# Patient Record
Sex: Male | Born: 1965 | Race: White | Hispanic: No | Marital: Single | Smoking: Current some day smoker
Health system: Southern US, Community
[De-identification: ages and names within clinical notes are randomized; demographics above are authoritative.]

## PROBLEM LIST (undated history)

## (undated) ENCOUNTER — Emergency Department: Admission: EM | Discharge status: Absent without leave | Payer: Medicaid Other

## (undated) DIAGNOSIS — L97509 Non-pressure chronic ulcer of other part of unspecified foot with unspecified severity: Secondary | ICD-10-CM

## (undated) DIAGNOSIS — E11621 Type 2 diabetes mellitus with foot ulcer: Secondary | ICD-10-CM

## (undated) DIAGNOSIS — E119 Type 2 diabetes mellitus without complications: Secondary | ICD-10-CM

## (undated) DIAGNOSIS — Z720 Tobacco use: Secondary | ICD-10-CM

---

## 2008-07-15 ENCOUNTER — Emergency Department: Payer: Self-pay | Admitting: Emergency Medicine

## 2009-03-30 ENCOUNTER — Observation Stay: Payer: Self-pay | Admitting: Internal Medicine

## 2009-04-01 ENCOUNTER — Inpatient Hospital Stay: Payer: Self-pay | Admitting: Internal Medicine

## 2011-01-12 DIAGNOSIS — J449 Chronic obstructive pulmonary disease, unspecified: Secondary | ICD-10-CM | POA: Insufficient documentation

## 2012-07-04 DIAGNOSIS — G4733 Obstructive sleep apnea (adult) (pediatric): Secondary | ICD-10-CM | POA: Insufficient documentation

## 2012-07-04 DIAGNOSIS — I10 Essential (primary) hypertension: Secondary | ICD-10-CM | POA: Insufficient documentation

## 2012-07-04 DIAGNOSIS — I493 Ventricular premature depolarization: Secondary | ICD-10-CM | POA: Insufficient documentation

## 2013-08-13 DIAGNOSIS — R350 Frequency of micturition: Secondary | ICD-10-CM | POA: Insufficient documentation

## 2014-01-15 DIAGNOSIS — L639 Alopecia areata, unspecified: Secondary | ICD-10-CM | POA: Insufficient documentation

## 2014-06-26 DIAGNOSIS — R0602 Shortness of breath: Secondary | ICD-10-CM | POA: Insufficient documentation

## 2014-10-07 DIAGNOSIS — R351 Nocturia: Secondary | ICD-10-CM | POA: Insufficient documentation

## 2014-10-07 DIAGNOSIS — N529 Male erectile dysfunction, unspecified: Secondary | ICD-10-CM | POA: Insufficient documentation

## 2014-10-07 DIAGNOSIS — N138 Other obstructive and reflux uropathy: Secondary | ICD-10-CM | POA: Insufficient documentation

## 2014-10-07 DIAGNOSIS — N401 Enlarged prostate with lower urinary tract symptoms: Secondary | ICD-10-CM

## 2015-01-09 DIAGNOSIS — Z872 Personal history of diseases of the skin and subcutaneous tissue: Secondary | ICD-10-CM | POA: Insufficient documentation

## 2015-01-09 DIAGNOSIS — L97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 of other part of left foot with fat layer exposed: Secondary | ICD-10-CM | POA: Insufficient documentation

## 2016-06-02 DIAGNOSIS — J679 Hypersensitivity pneumonitis due to unspecified organic dust: Secondary | ICD-10-CM | POA: Insufficient documentation

## 2016-11-30 DIAGNOSIS — H401190 Primary open-angle glaucoma, unspecified eye, stage unspecified: Secondary | ICD-10-CM | POA: Insufficient documentation

## 2016-12-15 ENCOUNTER — Ambulatory Visit: Payer: Medicaid Other | Attending: Neurology

## 2016-12-15 DIAGNOSIS — F5101 Primary insomnia: Secondary | ICD-10-CM | POA: Diagnosis not present

## 2016-12-15 DIAGNOSIS — I10 Essential (primary) hypertension: Secondary | ICD-10-CM | POA: Insufficient documentation

## 2016-12-15 DIAGNOSIS — R0683 Snoring: Secondary | ICD-10-CM | POA: Insufficient documentation

## 2016-12-15 DIAGNOSIS — G4733 Obstructive sleep apnea (adult) (pediatric): Secondary | ICD-10-CM | POA: Insufficient documentation

## 2017-02-07 ENCOUNTER — Ambulatory Visit: Payer: Medicaid Other | Attending: Neurology

## 2017-02-07 DIAGNOSIS — G4733 Obstructive sleep apnea (adult) (pediatric): Secondary | ICD-10-CM | POA: Diagnosis not present

## 2017-06-14 DIAGNOSIS — M48061 Spinal stenosis, lumbar region without neurogenic claudication: Secondary | ICD-10-CM | POA: Insufficient documentation

## 2017-09-05 DIAGNOSIS — Z961 Presence of intraocular lens: Secondary | ICD-10-CM | POA: Insufficient documentation

## 2017-11-30 DIAGNOSIS — F339 Major depressive disorder, recurrent, unspecified: Secondary | ICD-10-CM | POA: Insufficient documentation

## 2017-11-30 DIAGNOSIS — E119 Type 2 diabetes mellitus without complications: Secondary | ICD-10-CM | POA: Insufficient documentation

## 2017-12-29 ENCOUNTER — Ambulatory Visit
Admission: RE | Admit: 2017-12-29 | Discharge: 2017-12-29 | Disposition: A | Discharge status: Home / Self Care | Payer: Medicaid Other | Source: Ambulatory Visit | Attending: Physician Assistant | Admitting: Physician Assistant

## 2017-12-29 ENCOUNTER — Other Ambulatory Visit: Payer: Self-pay | Admitting: Physician Assistant

## 2017-12-29 ENCOUNTER — Encounter: Payer: Medicaid Other | Attending: Physician Assistant | Admitting: Physician Assistant

## 2017-12-29 DIAGNOSIS — G4730 Sleep apnea, unspecified: Secondary | ICD-10-CM | POA: Insufficient documentation

## 2017-12-29 DIAGNOSIS — Z833 Family history of diabetes mellitus: Secondary | ICD-10-CM | POA: Insufficient documentation

## 2017-12-29 DIAGNOSIS — G9009 Other idiopathic peripheral autonomic neuropathy: Secondary | ICD-10-CM | POA: Insufficient documentation

## 2017-12-29 DIAGNOSIS — J449 Chronic obstructive pulmonary disease, unspecified: Secondary | ICD-10-CM | POA: Insufficient documentation

## 2017-12-29 DIAGNOSIS — S81802A Unspecified open wound, left lower leg, initial encounter: Secondary | ICD-10-CM

## 2017-12-29 DIAGNOSIS — E1140 Type 2 diabetes mellitus with diabetic neuropathy, unspecified: Secondary | ICD-10-CM | POA: Diagnosis not present

## 2017-12-29 DIAGNOSIS — L97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 of other part of left foot with fat layer exposed: Secondary | ICD-10-CM | POA: Insufficient documentation

## 2017-12-29 DIAGNOSIS — E11621 Type 2 diabetes mellitus with foot ulcer: Secondary | ICD-10-CM | POA: Insufficient documentation

## 2017-12-29 DIAGNOSIS — Z8249 Family history of ischemic heart disease and other diseases of the circulatory system: Secondary | ICD-10-CM | POA: Diagnosis not present

## 2017-12-29 DIAGNOSIS — X58XXXA Exposure to other specified factors, initial encounter: Secondary | ICD-10-CM | POA: Insufficient documentation

## 2017-12-29 DIAGNOSIS — Z809 Family history of malignant neoplasm, unspecified: Secondary | ICD-10-CM | POA: Diagnosis not present

## 2017-12-29 DIAGNOSIS — F17200 Nicotine dependence, unspecified, uncomplicated: Secondary | ICD-10-CM | POA: Insufficient documentation

## 2017-12-31 NOTE — Progress Notes (Signed)
ELIAZER, HEMPHILL ([id number]) Visit Report for 12/29/2017 Abuse/Suicide Risk Screen Details Patient Name: [name], [name] Date of Service: 12/29/2017 10:30 AM Medical Record Number: [id number] Patient Account Number: [id number] Date of Birth/Sex: 1965/10/23 (52 y.o. Male) Treating RN: Cornell Barman Primary Care Abrina Petz: SYSTEM, PCP Other Clinician: Referring Victory Dresden: Referral, Self Treating Lorra Freeman/Extender: STONE III, HOYT Weeks in Treatment: 0 Abuse/Suicide Risk Screen Items Answer ABUSE/SUICIDE RISK SCREEN: Has anyone close to you tried to hurt or harm you recentlyo No Do you feel uncomfortable with anyone in your familyo No Has anyone forced you do things that you didnot want to doo No Do you have any thoughts of harming yourselfo No Patient displays signs or symptoms of abuse and/or neglect. No Electronic Signature(s) Signed: 12/29/2017 5:38:54 PM By: Gretta Cool, BSN, RN, CWS, Kim RN, BSN Entered By: Gretta Cool, BSN, RN, CWS, Kim on 12/29/2017 10:51:33 [place]chatton, Carin Hock ([id number]) -------------------------------------------------------------------------------- Activities of Daily Living Details Patient Name: [name] Date of Service: 12/29/2017 10:30 AM Medical Record Number: [id number] Patient Account Number: [id number] Date of Birth/Sex: 11-Sep-1965 (53 y.o. Male) Treating RN: Cornell Barman Primary Care Mack Thurmon: SYSTEM, PCP Other Clinician: Referring Ludell Zacarias: Referral, Self Treating Jaishaun Mcnab/Extender: STONE III, HOYT Weeks in Treatment: 0 Activities of Daily Living Items Answer Activities of Daily Living (Please select one for each item) Drive Automobile Completely Able Take Medications Completely Able Use Telephone Completely Able Care for Appearance Completely Able Use Toilet Completely Able Bath / Shower Completely Able Dress Self Completely Able Feed Self Completely Able Walk Completely Able Get In / Out Bed Completely Able Housework Completely Able Prepare  Meals Completely [place] for Self Completely Able Electronic Signature(s) Signed: 12/29/2017 5:38:54 PM By: Gretta Cool, BSN, RN, CWS, Kim RN, BSN Entered By: Gretta Cool, BSN, RN, CWS, Kim on 12/29/2017 10:51:44 Dehn, Carin Hock ([id number]) -------------------------------------------------------------------------------- Education Assessment Details Patient Name: [name] Date of Service: 12/29/2017 10:30 AM Medical Record Number: [id number] Patient Account Number: [id number] Date of Birth/Sex: 01-Jul-1965 (52 y.o. Male) Treating RN: Cornell Barman Primary Care Eyal Greenhaw: SYSTEM, PCP Other Clinician: Referring Ayson Cherubini: Referral, Self Treating Ezri Landers/Extender: STONE III, HOYT Weeks in Treatment: 0 Primary Learner Assessed: Patient Learning Preferences/Education Level/Primary Language Learning Preference: Explanation, Demonstration Highest Education Level: High School Preferred Language: English Cognitive Barrier Assessment/Beliefs Language Barrier: No Translator Needed: No Memory Deficit: No Emotional Barrier: No Cultural/Religious Beliefs Affecting Medical Care: No Physical Barrier Assessment Impaired Vision: Yes right prosthetic Impaired Hearing: No Decreased Hand dexterity: No Knowledge/Comprehension Assessment Knowledge Level: Medium Comprehension Level: Medium Ability to understand written Medium instructions: Ability to understand verbal Medium instructions: Motivation Assessment Anxiety Level: Calm Cooperation: Cooperative Education Importance: Acknowledges Need Interest in Health Problems: Asks Questions Perception: Coherent Willingness to Engage in Self- Medium Management Activities: Readiness to Engage in Self- Medium Management Activities: Electronic Signature(s) Signed: 12/29/2017 5:38:54 PM By: Gretta Cool, BSN, RN, CWS, Kim RN, BSN Entered By: Gretta Cool, BSN, RN, CWS, Kim on 12/29/2017 10:52:18 Divelbiss, Carin Hock  ([id number]) -------------------------------------------------------------------------------- Fall Risk Assessment Details Patient Name: [name] Date of Service: 12/29/2017 10:30 AM Medical Record Number: [id number] Patient Account Number: [id number] Date of Birth/Sex: 1965-12-20 (52 y.o. Male) Treating RN: Cornell Barman Primary Care Tomothy Eddins: SYSTEM, PCP Other Clinician: Referring Hadlee Burback: Referral, Self Treating Jihaad Bruschi/Extender: STONE III, HOYT Weeks in Treatment: 0 Fall Risk Assessment Items Have you had 2 or more falls in the last 12 monthso 0 Yes Have you had any fall that resulted in injury in the last 12 monthso 0 Yes FALL RISK ASSESSMENT:  History of falling - immediate or within 3 months 25 Yes Secondary diagnosis 0 No Ambulatory aid None/bed rest/wheelchair/nurse 0 Yes Crutches/cane/walker 0 No Furniture 0 No IV Access/Saline Lock 0 No Gait/Training Normal/bed rest/immobile 0 Yes Weak 0 No Impaired 0 No Mental Status Oriented to own ability 0 Yes Electronic Signature(s) Signed: 12/29/2017 5:38:54 PM By: Gretta Cool, BSN, RN, CWS, Kim RN, BSN Entered By: Gretta Cool, BSN, RN, CWS, Kim on 12/29/2017 10:52:47 Bernales, Carin Hock ([id number]) -------------------------------------------------------------------------------- Foot Assessment Details Patient Name: [name] Date of Service: 12/29/2017 10:30 AM Medical Record Number: [id number] Patient Account Number: [id number] Date of Birth/Sex: 06-14-1965 (52 y.o. Male) Treating RN: Cornell Barman Primary Care Dewel Lotter: SYSTEM, PCP Other Clinician: Referring Ashton Sabine: Referral, Self Treating Suda Forbess/Extender: STONE III, HOYT Weeks in Treatment: 0 Foot Assessment Items Site Locations + = Sensation present, - = Sensation absent, C = Callus, U = Ulcer R = Redness, W = Warmth, M = Maceration, PU = Pre-ulcerative lesion F = Fissure, S = Swelling, D = Dryness Assessment Right: Left: Other Deformity: No No Prior Foot Ulcer:  No No Prior Amputation: No No Charcot Joint: No No Ambulatory Status: Ambulatory Without Help Gait: Steady Electronic Signature(s) Signed: 12/29/2017 5:38:54 PM By: Gretta Cool, BSN, RN, CWS, Kim RN, BSN Entered By: Gretta Cool, BSN, RN, CWS, Kim on 12/29/2017 10:53:56 Pescador, Carin Hock ([id number]) -------------------------------------------------------------------------------- Nutrition Risk Assessment Details Patient Name: [name] Date of Service: 12/29/2017 10:30 AM Medical Record Number: [id number] Patient Account Number: [id number] Date of Birth/Sex: 08-14-1965 (52 y.o. Male) Treating RN: Cornell Barman Primary Care Lanai Conlee: SYSTEM, PCP Other Clinician: Referring Jordann Grime: Referral, Self Treating Cloria Ciresi/Extender: STONE III, HOYT Weeks in Treatment: 0 Height (in): 74 Weight (lbs): 252 Body Mass Index (BMI): 32.4 Nutrition Risk Assessment Items NUTRITION RISK SCREEN: I have an illness or condition that made me change the kind and/or amount of 0 No food I eat I eat fewer than two meals per day 0 No I eat few fruits and vegetables, or milk products 0 No I have three or more drinks of beer, liquor or wine almost every day 0 No I have tooth or mouth problems that make it hard for me to eat 0 No I don't always have enough money to buy the food I need 0 No I eat alone most of the time 0 No I take three or more different prescribed or over-the-counter drugs a day 1 Yes Without wanting to, I have lost or gained 10 pounds in the last six months 0 No I am not always physically able to shop, cook and/or feed myself 0 No Nutrition Protocols Good Risk Protocol 0 No interventions needed Moderate Risk Protocol Electronic Signature(s) Signed: 12/29/2017 5:38:54 PM By: Gretta Cool, BSN, RN, CWS, Kim RN, BSN Entered By: Gretta Cool, BSN, RN, CWS, Kim on 12/29/2017 10:53:19

## 2018-01-05 ENCOUNTER — Encounter: Payer: Medicaid Other | Admitting: Physician Assistant

## 2018-01-05 DIAGNOSIS — E11621 Type 2 diabetes mellitus with foot ulcer: Secondary | ICD-10-CM | POA: Diagnosis not present

## 2018-01-05 NOTE — Progress Notes (Signed)
JAKIE, DEBOW ([id number]) Visit Report for 12/29/2017 Allergy List Details Patient Name: [name], [name] Date of Service: 12/29/2017 10:30 AM Medical Record Number: [id number] Patient Account Number: [id number] Date of Birth/Sex: 07-06-1965 (52 y.o. Male) Treating RN: Cornell Barman Primary Care Martie Muhlbauer: SYSTEM, PCP Other Clinician: Referring Athalia Setterlund: Referral, Self Treating Awad Gladd/Extender: STONE III, HOYT Weeks in Treatment: 0 Allergies Active Allergies No Known Drug Allergies Allergy Notes Electronic Signature(s) Signed: 12/29/2017 5:38:54 PM By: Gretta Cool, BSN, RN, CWS, Kim RN, BSN Entered By: Gretta Cool, BSN, RN, CWS, Kim on 12/29/2017 10:43:33 [name], [name] ([id number]) -------------------------------------------------------------------------------- Arrival Information Details Patient Name: [name] Date of Service: 12/29/2017 10:30 AM Medical Record Number: [id number] Patient Account Number: [id number] Date of Birth/Sex: April 15, 1965 (52 y.o. Male) Treating RN: Cornell Barman Primary Care Korrin Waterfield: SYSTEM, PCP Other Clinician: Referring Ritaj Dullea: Referral, Self Treating Kaelon Weekes/Extender: STONE III, HOYT Weeks in Treatment: 0 Visit Information Patient Arrived: Ambulatory Arrival Time: 10:39 Accompanied By: self Transfer Assistance: None Patient Identification Verified: Yes Secondary Verification Process Yes Completed: Patient Has Alerts: Yes Patient Alerts: Borderline Diabetic Electronic Signature(s) Signed: 12/29/2017 5:38:54 PM By: Gretta Cool, BSN, RN, CWS, Kim RN, BSN Entered By: Gretta Cool, BSN, RN, CWS, Kim on 12/29/2017 10:43:12 [name], [name] ([id number]) -------------------------------------------------------------------------------- Clinic Level of Care Assessment Details Patient Name: [name] Date of Service: 12/29/2017 10:30 AM Medical Record Number: [id number] Patient Account Number: [id number] Date of Birth/Sex: [birth year] (52 y.o.  Male) Treating RN: Montey Hora Primary Care Marikay Roads: SYSTEM, PCP Other Clinician: Referring Leandria Thier: Referral, Self Treating Royal Beirne/Extender: STONE III, HOYT Weeks in Treatment: 0 Clinic Level of Care Assessment Items TOOL 1 Quantity Score []  - Use when EandM and Procedure is performed on INITIAL visit 0 ASSESSMENTS - Nursing Assessment / Reassessment X - General Physical Exam (combine w/ comprehensive assessment (listed just below) when 1 20 performed on new pt. evals) X- 1 25 Comprehensive Assessment (HX, ROS, Risk Assessments, Wounds Hx, etc.) ASSESSMENTS - Wound and Skin Assessment / Reassessment []  - Dermatologic / Skin Assessment (not related to wound area) 0 ASSESSMENTS - Ostomy and/or Continence Assessment and Care []  - Incontinence Assessment and Management 0 []  - 0 Ostomy Care Assessment and Management (repouching, etc.) PROCESS - Coordination of Care X - Simple Patient / Family Education for ongoing care 1 15 []  - 0 Complex (extensive) Patient / Family Education for ongoing care X- 1 10 Staff obtains Programmer, systems, Records, Test Results / Process Orders []  - 0 Staff telephones HHA, Nursing Homes / Clarify orders / etc []  - 0 Routine Transfer to another Facility (non-emergent condition) []  - 0 Routine Hospital Admission (non-emergent condition) X- 1 15 New Admissions / Biomedical engineer / Ordering NPWT, Apligraf, etc. []  - 0 Emergency Hospital Admission (emergent condition) PROCESS - Special Needs []  - Pediatric / Minor Patient Management 0 []  - 0 Isolation Patient Management []  - 0 Hearing / Language / Visual special needs []  - 0 Assessment of Community assistance (transportation, D/C planning, etc.) []  - 0 Additional assistance / Altered mentation []  - 0 Support Surface(s) Assessment (bed, cushion, seat, etc.) [name], [name] T. ([id number]) INTERVENTIONS - Miscellaneous []  - External ear exam 0 []  - 0 Patient Transfer (multiple staff / Librarian, academic / Similar devices) []  - 0 Simple Staple / Suture removal (25 or less) []  - 0 Complex Staple / Suture removal (26 or more) []  - 0 Hypo/Hyperglycemic Management (do not check if billed separately) X- 1 15 Ankle / Brachial Index (ABI) - do not check if billed separately  Has the patient been seen at the hospital within the last three years: Yes Total Score: 100 Level Of Care: New/Established - Level 3 Electronic Signature(s) Signed: 12/29/2017 5:23:22 PM By: Montey Hora Entered By: Montey Hora on 12/29/2017 11:25:08 [name], [name] ([id number]) -------------------------------------------------------------------------------- Encounter Discharge Information Details Patient Name: [name] Date of Service: 12/29/2017 10:30 AM Medical Record Number: [id number] Patient Account Number: [id number] Date of Birth/Sex: [birth year] (52 y.o. Male) Treating RN: Montey Hora Primary Care Hannah Crill: SYSTEM, PCP Other Clinician: Referring Alazia Crocket: Referral, Self Treating Ellina Sivertsen/Extender: STONE III, HOYT Weeks in Treatment: 0 Encounter Discharge Information Items Discharge Condition: Stable Ambulatory Status: Ambulatory Discharge Destination: Home Transportation: Private Auto Accompanied By: self Schedule Follow-up Appointment: Yes Clinical Summary of Care: Post Procedure Vitals: Temperature (F): 97.7 Pulse (bpm): 56 Respiratory Rate (breaths/min): 18 Blood Pressure (mmHg): 142/84 Electronic Signature(s) Signed: 12/29/2017 5:23:22 PM By: Montey Hora Entered By: Montey Hora on 12/29/2017 11:30:26 [name], [name] ([id number]) -------------------------------------------------------------------------------- Lower Extremity Assessment Details Patient Name: [name] Date of Service: 12/29/2017 10:30 AM Medical Record Number: [id number] Patient Account Number: [id number] Date of Birth/Sex: 10-24-1965 (52 y.o. Male) Treating RN: Cornell Barman Primary Care  Anora Schwenke: SYSTEM, PCP Other Clinician: Referring Cleston Lautner: Referral, Self Treating Rayshon Albaugh/Extender: STONE III, HOYT Weeks in Treatment: 0 Edema Assessment Assessed: [Left: No] [Right: No] Edema: [Left: N] [Right: o] Vascular Assessment Claudication: Claudication Assessment [Left:None] Pulses: Dorsalis Pedis Palpable: [Left:Yes] Doppler Audible: [Left:Yes] Posterior Tibial Palpable: [Left:Yes] Doppler Audible: [Left:Yes] Extremity colors, hair growth, and conditions: Extremity Color: [Left:Normal] Hair Growth on Extremity: [Left:Yes] Temperature of Extremity: [Left:Warm] Capillary Refill: [Left:< 3 seconds] Blood Pressure: Brachial: [Left:150] Dorsalis Pedis: 170 [Left:Dorsalis Pedis:] Ankle: Posterior Tibial: 150 [Left:Posterior Tibial: 1.13] Toe Nail Assessment Left: Right: Thick: No Discolored: No Deformed: No Improper Length and Hygiene: Yes Electronic Signature(s) Signed: 12/29/2017 5:38:54 PM By: Gretta Cool, BSN, RN, CWS, Kim RN, BSN Entered By: Gretta Cool, BSN, RN, CWS, Kim on 12/29/2017 10:56:35 [name], [name] ([id number]) -------------------------------------------------------------------------------- Multi Wound Chart Details Patient Name: [name] Date of Service: 12/29/2017 10:30 AM Medical Record Number: [id number] Patient Account Number: [id number] Date of Birth/Sex: [birth year] (52 y.o. 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 History: Cataracts, Asthma,  Chronic N/A N/A Obstructive Pulmonary Disease (COPD), Sleep Apnea, Neuropathy Date Acquired: 10/12/2017 N/A N/A Weeks of Treatment: 0 N/A N/A Wound Status: Open N/A N/A Measurements L x W x D 1x1.6x0.2 N/A N/A (cm) Area (cm) : 1.257 N/A N/A Volume (cm) : 0.251 N/A N/A % Reduction in Area: 0.00% N/A N/A % Reduction in Volume: 0.00% N/A N/A Classification: Full Thickness Without N/A N/A Exposed Support Structures Exudate Amount: Large N/A N/A Exudate Type: Serous N/A N/A Exudate Color: amber N/A N/A Wound Margin: Flat and Intact N/A N/A Granulation Amount: Medium (34-66%) N/A N/A Granulation Quality: Pink, Pale N/A N/A Necrotic Amount: Medium (34-66%) N/A N/A Exposed Structures: Fat Layer (Subcutaneous N/A N/A Tissue) Exposed: Yes Fascia: No [name], [name] T. ([id number]) Tendon: No Muscle: No Joint: No Bone: No Epithelialization: None N/A N/A Debridement: Debridement - Excisional N/A N/A Pre-procedure 11:08 N/A N/A Verification/Time Out Taken: Pain Control: Lidocaine 4% Topical Solution N/A N/A  Tissue Debrided: Callus, Subcutaneous, Slough N/A N/A Level: Skin/Subcutaneous Tissue N/A N/A Debridement Area (sq cm): 1.6 N/A N/A Instrument: Curette N/A N/A Bleeding: Minimum N/A N/A Hemostasis Achieved: Pressure N/A N/A Procedural Pain: 0 N/A N/A Post Procedural Pain: 0 N/A N/A Debridement Treatment Procedure was tolerated well N/A N/A Response: Post Debridement 1.1x1.7x0.1 N/A N/A Measurements L x W x D (cm) Post Debridement Volume: 0.147 N/A N/A (cm) Periwound Skin Texture: Excoriation: No N/A N/A Induration: No Callus: No Crepitus: No Rash: No Scarring: No Periwound Skin Moisture: Maceration: No N/A N/A Dry/Scaly: No Periwound Skin Color: Atrophie Blanche: No N/A N/A Cyanosis: No Ecchymosis: No Erythema: No Hemosiderin Staining: No Mottled: No Pallor: No Rubor: No Tenderness on Palpation: No N/A N/A Wound Preparation: Ulcer Cleansing: N/A  N/A Rinsed/Irrigated with Saline Topical Anesthetic Applied: Other: lidocaine 4% Procedures Performed: Debridement N/A N/A Treatment Notes Electronic Signature(s) Signed: 12/29/2017 5:23:22 PM By: Montey Hora Entered By: Montey Hora on 12/29/2017 11:24:50 [name], [name] ([id number]) -------------------------------------------------------------------------------- [place] Details Patient Name: [name] Date of Service: 12/29/2017 10:30 AM Medical Record Number: [id number] Patient Account Number: [id number] Date of Birth/Sex: 1965-03-26 (52 y.o. Male) Treating RN: Montey Hora Primary Care Tsugio Elison: SYSTEM, PCP Other Clinician: Referring Staley Budzinski: Referral, Self Treating Jamonica Schoff/Extender: STONE III, HOYT Weeks in Treatment: 0 Active Inactive ` Abuse / Safety / Falls / Self Care Management Nursing Diagnoses: History of Falls Goals: Patient will remain injury free related to falls Date Initiated: 12/29/2017 Target Resolution Date: 03/09/2018 Goal Status: Active Interventions: Assess fall risk on admission and as needed Notes: ` Orientation to the Wound Care Program Nursing Diagnoses: Knowledge deficit related to the wound healing center program Goals: Patient/caregiver will verbalize understanding of the [place] Program Date Initiated: 12/29/2017 Target Resolution Date: 03/09/2018 Goal Status: Active Interventions: Provide education on orientation to the wound center Notes: ` Wound/Skin Impairment Nursing Diagnoses: Impaired tissue integrity Goals: Ulcer/skin breakdown will heal within 14 weeks Date Initiated: 12/29/2017 Target Resolution Date: 03/09/2018 Goal Status: Active Interventions: LUMAN, HOLWAY ([id number]) Assess patient/caregiver ability to obtain necessary supplies Assess patient/caregiver ability to perform ulcer/skin care regimen upon admission and as needed Assess ulceration(s) every  visit Notes: Electronic Signature(s) Signed: 12/29/2017 5:23:22 PM By: Montey Hora Entered By: Montey Hora on 12/29/2017 11:24:38 Gentile, [name] ([id number]) -------------------------------------------------------------------------------- Pain Assessment Details Patient Name: [name] Date of Service: 12/29/2017 10:30 AM Medical Record Number: [id number] Patient Account Number: [id number] Date of Birth/Sex: 01-Nov-1965 (52 y.o. Male) Treating RN: Cornell Barman Primary Care Gwendoline Judy: SYSTEM, PCP Other Clinician: Referring Forest Redwine: Referral, Self Treating Lexington Devine/Extender: STONE III, HOYT Weeks in Treatment: 0 Active Problems Location of Pain Severity and Description of Pain Patient Has Paino No Site Locations Pain Management and Medication Current Pain Management: Electronic Signature(s) Signed: 12/29/2017 5:38:54 PM By: Gretta Cool, BSN, RN, CWS, Kim RN, BSN Entered By: Gretta Cool, BSN, RN, CWS, Kim on 12/29/2017 10:41:59 [name], [name] ([id number]) -------------------------------------------------------------------------------- Patient/Caregiver Education Details Patient Name: [name] Date of Service: 12/29/2017 10:30 AM Medical Record Number: [id number] Patient Account Number: [id number] Date of Birth/Gender: August 30, 1965 (52 y.o. Male) Treating RN: Montey Hora Primary Care Physician: SYSTEM, PCP Other Clinician: Referring Physician: Referral, Self Treating Physician/Extender: Melburn Hake, HOYT Weeks in Treatment: 0 Education Assessment Education Provided To: Patient Education Topics Provided Wound/Skin Impairment: Handouts: Other: wound care as ordered Methods: Demonstration, Explain/Verbal Responses: State content correctly Electronic Signature(s) Signed: 12/29/2017 5:23:22 PM By: Montey Hora Entered By: Montey Hora on 12/29/2017 11:25:29 [name], [name]  ([id number]) --------------------------------------------------------------------------------  Wound Assessment Details Patient Name: [name], [name] Date of Service: 12/29/2017 10:30 AM Medical Record Number: [id number] Patient Account Number: [id number] Date of Birth/Sex: 1965/10/24 (52 y.o. Male) Treating RN: Cornell Barman Primary Care Adore Kithcart: SYSTEM, PCP Other Clinician: Referring Ellinor Test: Referral, Self Treating Briauna Gilmartin/Extender: STONE III, HOYT Weeks in Treatment: 0 Wound Status Wound Number: 1 Primary Neuropathic Ulcer-Non Diabetic Etiology: Wound Location: Left Toe Great - Plantar Wound Open Wounding Event: Gradually Appeared Status: Date Acquired: 10/12/2017 Comorbid Cataracts, Asthma, Chronic Obstructive Weeks Of Treatment: 0 History: Pulmonary Disease (COPD), Sleep Apnea, Clustered Wound: No Neuropathy Photos Photo Uploaded By: Secundino Ginger on 12/29/2017 10:58:35 Wound Measurements Length: (cm) 1 Width: (cm) 1.6 Depth: (cm) 0.2 Area: (cm) 1.257 Volume: (cm) 0.251 % Reduction in Area: 0% % Reduction in Volume: 0% Epithelialization: None Tunneling: No Undermining: No Wound Description Full Thickness Without Exposed Support Foul Od Classification: Structures Slough/ Wound Margin: Flat and Intact Exudate Large Amount: Exudate Type: Serous Exudate Color: amber or After Cleansing: No Fibrino Yes Wound Bed Granulation Amount: Medium (34-66%) Exposed Structure Granulation Quality: Pink, Pale Fascia Exposed: No Necrotic Amount: Medium (34-66%) Fat Layer (Subcutaneous Tissue) Exposed: Yes Necrotic Quality: Adherent Slough Tendon Exposed: No Muscle Exposed: No Joint Exposed: No Bone Exposed: No Nicoletti, Garon T. ([id number]) Periwound Skin Texture Texture Color No Abnormalities Noted: No No Abnormalities Noted: No Callus: No Atrophie Blanche: No Crepitus: No Cyanosis: No Excoriation: No Ecchymosis: No Induration: No Erythema: No Rash:  No Hemosiderin Staining: No Scarring: No Mottled: No Pallor: No Moisture Rubor: No No Abnormalities Noted: No Dry / Scaly: No Maceration: No Wound Preparation Ulcer Cleansing: Rinsed/Irrigated with Saline Topical Anesthetic Applied: Other: lidocaine 4%, Treatment Notes Wound #1 (Left, Plantar Toe Great) 1. Cleansed with: Clean wound with Normal Saline 2. Anesthetic Topical Lidocaine 4% cream to wound bed prior to debridement 4. Dressing Applied: Prisma Ag Other dressing (specify in notes) 5. Secondary Dressing Applied Kerlix/Conform 7. Secured with Recruitment consultant) Signed: 12/29/2017 5:38:54 PM By: Gretta Cool, BSN, RN, CWS, Kim RN, BSN Entered By: Gretta Cool, BSN, RN, CWS, Kim on 12/29/2017 10:55:42 Welke, [name] ([id number]) -------------------------------------------------------------------------------- [place] Details Patient Name: [name] Date of Service: 12/29/2017 10:30 AM Medical Record Number: [id number] Patient Account Number: [id number] Date of Birth/Sex: 1965/11/18 (52 y.o. Male) Treating RN: Cornell Barman Primary Care Jaden Batchelder: SYSTEM, PCP Other Clinician: Referring Zackarie Chason: Referral, Self Treating Avital Dancy/Extender: STONE III, HOYT Weeks in Treatment: 0 Vital Signs Time Taken: 10:42 Temperature (F): 97.7 Height (in): 74 Pulse (bpm): 56 Weight (lbs): 252 Respiratory Rate (breaths/min): 16 Body Mass Index (BMI): 32.4 Blood Pressure (mmHg): 142/84 Reference Range: 80 - 120 mg / dl Electronic Signature(s) Signed: 12/29/2017 5:38:54 PM By: Gretta Cool, BSN, RN, CWS, Kim RN, BSN Entered By: Gretta Cool, BSN, RN, CWS, Kim on 12/29/2017 10:42:30

## 2018-01-05 NOTE — Progress Notes (Signed)
[name], [name] ([id number]) Visit Report for 12/29/2017 Chief Complaint Document Details Patient Name: [name], [name] Date of Service: 12/29/2017 10:30 AM Medical Record Number: [id number] Patient Account Number: [id number] Date of Birth/Sex: 1965-11-27 (52 y.o. Male) Treating RN: Montey Hora Primary Care Provider: SYSTEM, PCP Other Clinician: Referring Provider: Referral, Self Treating Provider/Extender: Melburn Hake, Macario Shear Weeks in Treatment: 0 Information Obtained from: Patient Chief Complaint Left 1st toe ulcer Electronic Signature(s) Signed: 01/04/2018 1:45:00 AM By: Worthy Keeler PA-C Entered By: Worthy Keeler on 12/29/2017 11:04:02 Hildebrandt, [name] ([id number]) -------------------------------------------------------------------------------- Debridement Details Patient Name: [name] Date of Service: 12/29/2017 10:30 AM Medical Record Number: [id number] Patient Account Number: [id number] Date of Birth/Sex: [birth year] (52 y.o. Male) Treating RN: Montey Hora Primary Care Provider: SYSTEM, PCP Other Clinician: Referring Provider: Referral, Self Treating Provider/Extender: STONE III, Lalana Wachter Weeks in Treatment: 0 Debridement Performed for Wound #1 Left,Plantar Toe Great Assessment: Performed By: Physician STONE III, Kathe Wirick E., PA-C Debridement Type: Debridement Level of Consciousness (Pre- Awake and Alert procedure): Pre-procedure Verification/Time Yes - 11:08 Out Taken: Start Time: 11:08 Pain Control: Lidocaine 4% Topical Solution Total Area Debrided (L x W): 1 (cm) x 1.6 (cm) = 1.6 (cm) Tissue and other material Callus, Slough, Subcutaneous, Slough debrided: Level: Skin/Subcutaneous Tissue Debridement Description: Excisional Instrument: Curette Bleeding: Minimum Hemostasis Achieved: Pressure End Time: 11:16 Procedural Pain: 0 Post Procedural Pain: 0 Response to Treatment: Procedure was tolerated well Level of Consciousness Awake and  Alert (Post-procedure): Post Debridement Measurements of Total Wound Length: (cm) 1.1 Width: (cm) 1.7 Depth: (cm) 0.1 Volume: (cm) 0.147 Character of Wound/Ulcer Post Debridement: Improved Post Procedure Diagnosis Same as Pre-procedure Electronic Signature(s) Signed: 12/29/2017 5:23:22 PM By: Montey Hora Signed: 01/04/2018 1:45:00 AM By: Worthy Keeler PA-C Entered By: Montey Hora on 12/29/2017 11:16:22 [name], [name] ([id number]) -------------------------------------------------------------------------------- HPI Details Patient Name: [name] Date of Service: 12/29/2017 10:30 AM Medical Record Number: [id number] Patient Account Number: [id number] Date of Birth/Sex: May 06, 1965 (52 y.o. Male) Treating RN: Montey Hora Primary Care Provider: SYSTEM, PCP Other Clinician: Referring Provider: Referral, Self Treating Provider/Extender: Melburn Hake, Heela Heishman Weeks in Treatment: 0 History of Present Illness HPI Description: 12/29/17 on evaluation today patient presents for an injury due to a trauma on the left great toe plantar aspect. 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 We did review his notes and records as well and there appears to be no evidence of a formal diagnosis of diabetes. 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 At this point the patient has no pain in regard to the wound on his left great toe. In general he seems to be doing excellent in this regard. Nonetheless he does have a wound with callous surrounding there appears to be some new epithelialization but in general the healing seems to be very slow according to the patient. He was seen in the ER at [facility] on 11/24/17 where he was prescribed Bactrim along with Keflex for infection. That has been completed at this  point. No fevers, chills, nausea, or vomiting noted at this time. Electronic Signature(s) Signed: 01/04/2018 1:45:00 AM By: Worthy Keeler PA-C Entered By: Worthy Keeler on 12/29/2017 13:23:03 [name], [name] ([id number]) -------------------------------------------------------------------------------- Physical Exam Details Patient Name: [name] Date of Service: 12/29/2017 10:30 AM Medical Record  Number: [id number] Patient Account Number: [id number] Date of Birth/Sex: January 02, 1966 (52 y.o. Male) Treating RN: Montey Hora Primary Care Provider: SYSTEM, PCP Other Clinician: Referring Provider: Referral, Self Treating Provider/Extender: STONE III, Kenyanna Grzesiak Weeks in Treatment: 0 Constitutional patient is hypertensive.. pulse regular and within target range for patient.Marland Kitchen respirations regular, non-labored and within target range for patient.Marland Kitchen temperature within target range for patient.. Well-nourished and well-hydrated in no acute distress. Eyes conjunctiva clear no eyelid edema noted. pupils equal round and reactive to light and accommodation. Ears, Nose, Mouth, and Throat no gross abnormality of ear auricles or external auditory canals. normal hearing noted during conversation. mucus membranes moist. Respiratory normal breathing without difficulty. clear to auscultation bilaterally. Cardiovascular regular rate and rhythm with normal S1, S2. 2+ dorsalis pedis/posterior tibialis pulses. no clubbing, cyanosis, significant edema, <3 sec cap refill. Gastrointestinal (GI) soft, non-tender, non-distended, +BS. no ventral hernia noted. Musculoskeletal normal gait and posture. no significant deformity or arthritic changes, no loss or range of motion, no clubbing. Psychiatric this patient is able to make decisions and demonstrates good insight into disease process. Alert and Oriented x 3. pleasant and cooperative. Notes On evaluation today patient's wound did have slough noted on the  surface the wound he did also have issues with some callous surrounding the edge of the wound and the printer. Currently he tolerated the debridement without complication after obtaining informed consent. Post debridement the wound bed actually appears to be doing significantly better which is great news. He has been having some issues with his knee he had a accidental falls slipping on his steps at home out on his porch. Nonetheless that's given the little bit of trouble but not affecting his ability to walk in general. Electronic Signature(s) Signed: 01/04/2018 1:45:00 AM By: Worthy Keeler PA-C Entered By: Worthy Keeler on 12/29/2017 13:24:24 [name], [name] ([id number]) -------------------------------------------------------------------------------- Physician Orders Details Patient Name: [name] Date of Service: 12/29/2017 10:30 AM Medical Record Number: [id number] Patient Account Number: [id number] Date of Birth/Sex: 01/20/1966 (52 y.o. 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 o May Shower, gently pat wound dry prior to applying new dressing. Anesthetic (add to Medication List) Wound #1 Left,Plantar Toe Great o Topical Lidocaine 4% cream applied to wound bed prior to debridement (In Clinic Only). Primary Wound Dressing Wound #1 Left,Plantar Toe Great o Silver Collagen Secondary Dressing Wound #1 Left,Plantar Toe Great o Conform/Kerlix o  Drawtex Dressing Change Frequency Wound #1 Left,Plantar Toe Great o Change dressing every day. Follow-up Appointments Wound #1 Left,Plantar Toe Great o Return Appointment in 1 week. Off-Loading Wound #1 Left,Plantar Toe Great o Other: - Darco shoe with peg assist Radiology o X-ray, toes - left great toe [name], [name] ([id number]) Electronic Signature(s) Signed: 12/29/2017 5:23:22 PM By: Montey Hora Signed: 01/04/2018 1:45:00 AM By: Worthy Keeler PA-C Entered By: Montey Hora on 12/29/2017 11:19:51 Caffey, [name] ([id number]) -------------------------------------------------------------------------------- Problem List Details Patient Name: [name] Date of Service: 12/29/2017 10:30 AM Medical Record Number: [id number] Patient Account Number: [id number] Date of Birth/Sex: [birth year] (52 y.o. Male) Treating RN: Montey Hora Primary  Care Provider: SYSTEM, PCP Other Clinician: Referring Provider: Referral, Self Treating Provider/Extender: STONE III, Yasin Ducat Weeks in Treatment: 0 Active Problems ICD-10 Evaluated Encounter Code Description Active Date Today Diagnosis G90.09 Other idiopathic peripheral autonomic neuropathy 12/29/2017 No Yes L97.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 of other part of left foot with fat 12/29/2017 No Yes layer exposed I10 Essential (primary) hypertension 12/29/2017 No Yes G47.30 Sleep apnea, unspecified 12/29/2017 No Yes Inactive Problems Resolved Problems Electronic Signature(s) Signed: 01/04/2018 1:45:00 AM By: Worthy Keeler PA-C Entered By: Worthy Keeler on 12/29/2017 11:03:42 [name], [name] ([id number]) -------------------------------------------------------------------------------- Progress Note Details Patient Name: [name] Date of Service: 12/29/2017 10:30 AM Medical Record Number: [id number] Patient Account Number: [id number] Date of Birth/Sex: [birth year] (52 y.o. Male) Treating RN: Montey Hora Primary Care Provider: SYSTEM, PCP Other Clinician: Referring Provider: Referral, Self Treating Provider/Extender: STONE III, Tuesday Terlecki Weeks in Treatment: 0 Subjective Chief Complaint Information obtained from Patient Left 1st toe ulcer History of Present Illness (HPI) 12/29/17 on evaluation today patient presents for an injury due to a trauma on the left great toe plantar aspect. 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 We did review his notes and records as well and there appears to be no evidence of a formal diagnosis of diabetes. 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 At this point the patient has no pain in regard to the wound on his left great toe. In general he seems to be doing excellent in this regard. Nonetheless he does have a wound with callous surrounding there appears to be some new epithelialization but in general the healing seems to be very slow according to the patient. He was seen in the ER at [facility] on 11/24/17 where he was prescribed Bactrim along with Keflex for infection. That has been completed at this point. No fevers, chills, nausea, or vomiting noted at this time. Wound History Patient presents with 1 open wound that has been present for approximately 2 months. Patient has been treating wound in the following manner: neosporin. Laboratory tests have been performed in the last month. Patient reportedly has not tested positive for an antibiotic resistant organism. Patient reportedly has not tested positive for osteomyelitis. Patient reportedly has not had testing performed to evaluate circulation in the legs. Patient History Information obtained from Patient. Allergies No Known Drug Allergies Family History Cancer - Mother,Father, Diabetes - Mother, Hypertension -  Father, No family history of Kidney Disease, Lung Disease, Seizures, Stroke, Thyroid Problems, Tuberculosis. Social History Current every day smoker - 15 years, Marital Status - Single, Alcohol Use - Moderate, Drug Use - No History, Caffeine Use - Never. Medical History Eyes Patient has history of Cataracts - Left Denies history of Glaucoma, Optic Neuritis Ear/Nose/Mouth/Throat Denies history of Chronic sinus problems/congestion, Middle ear problems Hematologic/Lymphatic Denies history of Anemia, Hemophilia, Human Immunodeficiency Virus, Sickle Cell Disease Respiratory Schwartz, Cortlan T. ([id number]) Patient has history of Asthma, Chronic Obstructive Pulmonary Disease (COPD), Sleep Apnea - C-pap Denies history of Aspiration, Pneumothorax, Tuberculosis Cardiovascular Denies history of Angina, Arrhythmia, Congestive Heart Failure, Coronary Artery Disease, Deep Vein Thrombosis, Hypertension, Hypotension, Myocardial Infarction, Peripheral Arterial Disease, Peripheral Venous Disease, Phlebitis, Vasculitis Gastrointestinal Denies history of Cirrhosis , Colitis, Crohn s, Hepatitis A, Hepatitis B, Hepatitis C Endocrine Denies history of Type I Diabetes, Type II Diabetes  Integumentary (Skin) Denies history of History of Burn, History of pressure wounds Musculoskeletal Denies history of Gout, Rheumatoid Arthritis, Osteoarthritis, Osteomyelitis Neurologic Patient has history of Neuropathy - Feet Denies history of Dementia, Quadriplegia, Paraplegia, Seizure Disorder Oncologic Denies history of Received Chemotherapy, Received Radiation Psychiatric Denies history of Anorexia/bulimia, Confinement Anxiety Medical And Surgical History Notes Endocrine Borderline Review of Systems (ROS) Constitutional Symptoms (General Health) The patient has no complaints or symptoms. Eyes The patient has no complaints or symptoms, Right Eye Prosthetic Ear/Nose/Mouth/Throat The patient has no complaints or  symptoms. Hematologic/Lymphatic The patient has no complaints or symptoms. Respiratory Complains or has symptoms of Shortness of Breath. Denies complaints or symptoms of Chronic or frequent coughs. Cardiovascular The patient has no complaints or symptoms. Gastrointestinal The patient has no complaints or symptoms. Endocrine Complains or has symptoms of Polydypsia (Excessive Thirst). Denies complaints or symptoms of Hepatitis, Thyroid disease. Immunological The patient has no complaints or symptoms. Integumentary (Skin) Complains or has symptoms of Wounds. Denies complaints or symptoms of Bleeding or bruising tendency, Breakdown, Swelling. Musculoskeletal The patient has no complaints or symptoms. Neurologic The patient has no complaints or symptoms. Oncologic The patient has no complaints or symptoms. Psychiatric Complains or has symptoms of Anxiety. Denies complaints or symptoms of Claustrophobia. [name], [name] ([id number]) Objective Constitutional patient is hypertensive.. pulse regular and within target range for patient.Marland Kitchen respirations regular, non-labored and within target range for patient.Marland Kitchen temperature within target range for patient.. Well-nourished and well-hydrated in no acute distress. Vitals Time Taken: 10:42 AM, Height: 74 in, Weight: 252 lbs, BMI: 32.4, Temperature: 97.7 F, Pulse: 56 bpm, Respiratory Rate: 16 breaths/min, Blood Pressure: 142/84 mmHg. Eyes conjunctiva clear no eyelid edema noted. pupils equal round and reactive to light and accommodation. Ears, Nose, Mouth, and Throat no gross abnormality of ear auricles or external auditory canals. normal hearing noted during conversation. mucus membranes moist. Respiratory normal breathing without difficulty. clear to auscultation bilaterally. Cardiovascular regular rate and rhythm with normal S1, S2. 2+ dorsalis pedis/posterior tibialis pulses. no clubbing, cyanosis, significant edema, Gastrointestinal  (GI) soft, non-tender, non-distended, +BS. no ventral hernia noted. Musculoskeletal normal gait and posture. no significant deformity or arthritic changes, no loss or range of motion, no clubbing. Psychiatric this patient is able to make decisions and demonstrates good insight into disease process. Alert and Oriented x 3. pleasant and cooperative. General Notes: On evaluation today patient's wound did have slough noted on the surface the wound he did also have issues with some callous surrounding the edge of the wound and the printer. Currently he tolerated the debridement without complication after obtaining informed consent. Post debridement the wound bed actually appears to be doing significantly better which is great news. He has been having some issues with his knee he had a accidental falls slipping on his steps at home out on his porch. Nonetheless that's given the little bit of trouble but not affecting his ability to walk in general. Integumentary (Hair, Skin) Wound #1 status is Open. Original cause of wound was Gradually Appeared. The wound is located on the [employer]. The wound measures 1cm length x 1.6cm width x 0.2cm depth; 1.257cm^2 area and 0.251cm^3 volume. There is Fat Layer (Subcutaneous Tissue) Exposed exposed. There is no tunneling or undermining noted. There is a large amount of serous drainage noted. The wound margin is flat and intact. There is medium (34-66%) pink, pale granulation within the wound bed. There is a medium (34-66%) amount of necrotic tissue within the wound bed including  Adherent Slough. The periwound skin appearance did not exhibit: Callus, Crepitus, Excoriation, Induration, Rash, Scarring, Dry/Scaly, Maceration, Atrophie Blanche, Cyanosis, Ecchymosis, Hemosiderin Staining, Mottled, Pallor, Rubor, Erythema. [name], [name] ([id number]) Assessment Active Problems ICD-10 Other idiopathic peripheral autonomic neuropathy Non-pressure chronic  ulcer of other part of left foot with fat layer exposed Essential (primary) hypertension Sleep apnea, unspecified Procedures Wound #1 Pre-procedure diagnosis of Wound #1 is a Neuropathic Ulcer-Non Diabetic located on the Left,Plantar Toe Great . There was a Excisional Skin/Subcutaneous Tissue Debridement with a total area of 1.6 sq cm performed by STONE III, Alexsandra Shontz E., PA-C. With the following instrument(s): Curette Material removed includes Callus, Subcutaneous Tissue, and Slough after achieving pain control using Lidocaine 4% Topical Solution. No specimens were taken. A time out was conducted at 11:08, prior to the start of the procedure. A Minimum amount of bleeding was controlled with Pressure. The procedure was tolerated well with a pain level of 0 throughout and a pain level of 0 following the procedure. Post Debridement Measurements: 1.1cm length x 1.7cm width x 0.1cm depth; 0.147cm^3 volume. Character of Wound/Ulcer Post Debridement is improved. Post procedure Diagnosis Wound #1: Same as Pre-Procedure Plan Wound Cleansing: Wound #1 Left,Plantar Toe Great: Clean wound with Normal Saline. May Shower, gently pat wound dry prior to applying new dressing. Anesthetic (add to Medication List): Wound #1 Left,Plantar Toe Great: Topical Lidocaine 4% cream applied to wound bed prior to debridement (In Clinic Only). Primary Wound Dressing: Wound #1 Left,Plantar Toe Great: Silver Collagen Secondary Dressing: Wound #1 Left,Plantar Toe Great: Conform/Kerlix Drawtex Dressing Change Frequency: Wound #1 Left,Plantar Toe Great: Change dressing every day. Follow-up Appointments: Wound #1 Left,Plantar Toe Great: Return Appointment in 1 week. Off-Loading: [name], [name] ([id number]) Wound #1 Left,Plantar Toe Great: Other: - Darco shoe with peg assist Radiology ordered were: X-ray, toes - left great toe I'm gonna suggest currently that we initiate see above wound care measures for the next  week. The patient is in agreement with plan. We will subsequently see were things stand at follow-up. If anything changes or worsens in the interim he will contact the office and let me know. Please see above for specific wound care orders. We will see patient for re-evaluation in 1 week(s) here in the clinic. If anything worsens or changes patient will contact our office for additional recommendations. Electronic Signature(s) Signed: 01/04/2018 1:45:00 AM By: Worthy Keeler PA-C Entered By: Worthy Keeler on 12/29/2017 13:24:47 [name], [name] ([id number]) -------------------------------------------------------------------------------- ROS/PFSH Details Patient Name: [name] Date of Service: 12/29/2017 10:30 AM Medical Record Number: [id number] Patient Account Number: [id number] Date of Birth/Sex: 1965/07/10 (52 y.o. Male) Treating RN: Cornell Barman Primary Care Provider: SYSTEM, PCP Other Clinician: Referring Provider: Referral, Self Treating Provider/Extender: STONE III, Leonardo Makris Weeks in Treatment: 0 Information Obtained From Patient Wound History Do you currently have one or more open woundso Yes How many open wounds do you currently haveo 1 Approximately how long have you had your woundso 2 months How have you been treating your wound(s) until nowo neosporin Has your wound(s) ever healed and then re-openedo No Have you had any lab work done in the past montho Yes Who ordered the lab work doneo Dr. Ivar Bury Have you tested positive for an antibiotic resistant organism (MRSA, VRE)o No Have you tested positive for osteomyelitis (bone infection)o No Have you had any tests for circulation on your legso No Eyes Complaints and Symptoms: No Complaints or Symptoms Complaints and Symptoms: Negative for: Vision Changes Review of  System Notes: Right Eye Prosthetic Medical History: Positive for: Cataracts - Left Negative for: Glaucoma; Optic Neuritis Respiratory Complaints and  Symptoms: Positive for: Shortness of Breath Negative for: Chronic or frequent coughs Medical History: Positive for: Asthma; Chronic Obstructive Pulmonary Disease (COPD); Sleep Apnea - C-pap Negative for: Aspiration; Pneumothorax; Tuberculosis Cardiovascular Complaints and Symptoms: No Complaints or Symptoms Complaints and Symptoms: Negative for: Chest pain; LE edema Medical History: Negative for: Angina; Arrhythmia; Congestive Heart Failure; Coronary Artery Disease; Deep Vein Thrombosis; Hypertension; Hypotension; Myocardial Infarction; Peripheral Arterial Disease; Peripheral Venous Disease; Phlebitis; Hocutt, Sanford T. ([id number]) Vasculitis Endocrine Complaints and Symptoms: Positive for: Polydypsia (Excessive Thirst) Negative for: Hepatitis; Thyroid disease Medical History: Negative for: Type I Diabetes; Type II Diabetes Past Medical History Notes: Borderline Integumentary (Skin) Complaints and Symptoms: Positive for: Wounds Negative for: Bleeding or bruising tendency; Breakdown; Swelling Medical History: Negative for: History of Burn; History of pressure wounds Psychiatric Complaints and Symptoms: Positive for: Anxiety Negative for: Claustrophobia Medical History: Negative for: Anorexia/bulimia; Confinement Anxiety Constitutional Symptoms (General Health) Complaints and Symptoms: No Complaints or Symptoms Ear/Nose/Mouth/Throat Complaints and Symptoms: No Complaints or Symptoms Medical History: Negative for: Chronic sinus problems/congestion; Middle ear problems Hematologic/Lymphatic Complaints and Symptoms: No Complaints or Symptoms Medical History: Negative for: Anemia; Hemophilia; Human Immunodeficiency Virus; Sickle Cell Disease Gastrointestinal Complaints and Symptoms: No Complaints or Symptoms Medical History: Negative for: Cirrhosis ; Colitis; Crohnos; Hepatitis A; Hepatitis B; Hepatitis C Jamil, Deshan T. ([id number]) Immunological Complaints and  Symptoms: No Complaints or Symptoms Musculoskeletal Complaints and Symptoms: No Complaints or Symptoms Medical History: Negative for: Gout; Rheumatoid Arthritis; Osteoarthritis; Osteomyelitis Neurologic Complaints and Symptoms: No Complaints or Symptoms Medical History: Positive for: Neuropathy - Feet Negative for: Dementia; Quadriplegia; Paraplegia; Seizure Disorder Oncologic Complaints and Symptoms: No Complaints or Symptoms Medical History: Negative for: Received Chemotherapy; Received Radiation HBO Extended History Items Eyes: Cataracts Immunizations Pneumococcal Vaccine: Received Pneumococcal Vaccination: No Implantable Devices Family and Social History Cancer: Yes - Mother,Father; Diabetes: Yes - Mother; Hypertension: Yes - Father; Kidney Disease: No; Lung Disease: No; Seizures: No; Stroke: No; Thyroid Problems: No; Tuberculosis: No; Current every day smoker - 15 years; Marital Status - Single; Alcohol Use: Moderate; Drug Use: No History; Caffeine Use: Never; Advanced Directives: No; Patient does not want information on Advanced Directives; Do not resuscitate: No; Living Will: No; Medical Power of Attorney: No Electronic Signature(s) Signed: 12/29/2017 5:38:54 PM By: Gretta Cool, BSN, RN, CWS, Kim RN, BSN Signed: 01/04/2018 1:45:00 AM By: Worthy Keeler PA-C Entered By: Gretta Cool, BSN, RN, CWS, Kim on 12/29/2017 10:51:19 Franken, [name] ([id number]) -------------------------------------------------------------------------------- [place] Details Patient Name: [name] Date of Service: 12/29/2017 Medical Record Number: [id number] Patient Account Number: [id number] Date of Birth/Sex: Aug 09, 1965 (53 y.o. 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 I10 Essential (primary) hypertension G47.30 Sleep apnea, unspecified Facility Procedures CPT4 Code: 26948546 Description: 99213 - WOUND CARE VISIT-LEV 3 EST PT Modifier: Quantity: 1 CPT4 Code: 27035009 Description: 11042 - DEB SUBQ TISSUE 20 SQ CM/< ICD-10 Diagnosis Description L97.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 of other part of left foot with fat Modifier: layer exposed Quantity: 1 Physician Procedures CPT4 Code: 3818299 Description: WC PHYS LEVEL 3 o NEW PT ICD-10 Diagnosis Description G90.09 Other idiopathic peripheral autonomic neuropathy L97.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  of other part of left foot with fat I10 Essential (primary) hypertension G47.30 Sleep apnea,  unspecified Modifier: 25 layer exposed Quantity: 1 CPT4 Code: 4967591 Description: 11042 - WC PHYS SUBQ TISS 20 SQ CM ICD-10 Diagnosis Description L97.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

## 2018-01-07 NOTE — Progress Notes (Signed)
MONTE, ZINNI ([id number]) Visit Report for 01/05/2018 Arrival Information Details Patient Name: [name], [name] Date of Service: 01/05/2018 10:00 AM Medical Record Number: [id number] Patient Account Number: [id number] Date of Birth/Sex: 05-29-1965 (52 y.o. M) Treating RN: Montey Hora Primary Care Tnia Anglada: SYSTEM, PCP Other Clinician: Referring Tameron Lama: Referral, Self Treating Ashlin Hidalgo/Extender: STONE III, HOYT Weeks in Treatment: 1 Visit Information History Since Last Visit Added or deleted any medications: No Patient Arrived: Ambulatory Any new allergies or adverse reactions: No Arrival Time: 09:58 Had a fall or experienced change in No Accompanied By: self activities of daily living that may affect Transfer Assistance: None risk of falls: Patient Identification Verified: Yes Signs or symptoms of abuse/neglect since last visito No Secondary Verification Process Yes Hospitalized since last visit: No Completed: Implantable device outside of the clinic excluding No Patient Has Alerts: Yes cellular tissue based products placed in the center Patient Alerts: Borderline since last visit: Diabetic Has Dressing in Place as Prescribed: Yes Has Footwear/Offloading in Place as Prescribed: Yes Left: Wedge Shoe Pain Present Now: No Electronic Signature(s) Signed: 01/05/2018 5:35:44 PM By: Montey Hora Entered By: Montey Hora on 01/05/2018 09:58:52 Bryars, Carin Hock ([id number]) -------------------------------------------------------------------------------- Encounter Discharge Information Details Patient Name: [name] Date of Service: 01/05/2018 10:00 AM Medical Record Number: [id number] Patient Account Number: [id number] Date of Birth/Sex: 05-Oct-1965 (52 y.o. M) Treating RN: Montey Hora Primary Care Anaiz Qazi: SYSTEM, PCP Other Clinician: Referring Rylen Hou: Referral, Self Treating Cambreigh Dearing/Extender: STONE III, HOYT Weeks in Treatment: 1 Encounter  Discharge Information Items Discharge Condition: Stable Ambulatory Status: Ambulatory Discharge Destination: Home Transportation: Private Auto Accompanied By: self Schedule Follow-up Appointment: Yes Clinical Summary of Care: Post Procedure Vitals: Temperature (F): 97.5 Pulse (bpm): 57 Respiratory Rate (breaths/min): 18 Blood Pressure (mmHg): 140/68 Electronic Signature(s) Signed: 01/05/2018 5:35:44 PM By: Montey Hora Entered By: Montey Hora on 01/05/2018 10:31:38 Pogue, Carin Hock ([id number]) -------------------------------------------------------------------------------- Lower Extremity Assessment Details Patient Name: [name] Date of Service: 01/05/2018 10:00 AM Medical Record Number: [id number] Patient Account Number: [id number] Date of Birth/Sex: Nov 21, 1965 (52 y.o. M) Treating RN: Montey Hora Primary Care Neida Ellegood: SYSTEM, PCP Other Clinician: Referring Jaycee Pelzer: Referral, Self Treating Kaelum Kissick/Extender: STONE III, HOYT Weeks in Treatment: 1 Vascular Assessment Pulses: Dorsalis Pedis Palpable: [Left:Yes] Posterior Tibial Extremity colors, hair growth, and conditions: Extremity Color: [Left:Normal] Hair Growth on Extremity: [Left:Yes] Temperature of Extremity: [Left:Warm] Capillary Refill: [Left:< 3 seconds] Toe Nail Assessment Left: Right: Thick: Yes Discolored: Yes Deformed: No Improper Length and Hygiene: No Electronic Signature(s) Signed: 01/05/2018 5:35:44 PM By: Montey Hora Entered By: Montey Hora on 01/05/2018 10:05:58 Davtyan, Carin Hock ([id number]) -------------------------------------------------------------------------------- Multi Wound Chart Details Patient Name: [name] Date of Service: 01/05/2018 10:00 AM Medical Record Number: [id number] Patient Account Number: [id number] Date of Birth/Sex: [birth year] (52 y.o. 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] [N/A:N/A] Comorbid History: [1:Cataracts, Asthma, Chronic Obstructive Pulmonary Disease (COPD), Sleep Apnea, Neuropathy] [N/A:N/A] Date Acquired: [1:10/12/2017] [N/A:N/A] Weeks of Treatment: [1:1] [N/A:N/A] Wound Status: [1:Open] [N/A:N/A] Measurements L x W x D [1:0.9x1x0.1] [N/A:N/A] (cm) Area (cm) : [9:0.300] [N/A:N/A] Volume (cm) : [1:0.071] [N/A:N/A] % Reduction in Area: [1:43.80%] [N/A:N/A] % Reduction in Volume: [1:71.70%] [  N/A:N/A] Classification: [1:Full Thickness Without Exposed Support Structures] [N/A:N/A] Exudate Amount: [1:Medium] [N/A:N/A] Exudate Type: [1:Serous] [N/A:N/A] Exudate Color: [1:amber] [N/A:N/A] Wound Margin: [1:Flat and Intact] [N/A:N/A] Granulation Amount: [1:Large (67-100%)] [N/A:N/A] Granulation Quality: [1:Pink, Hyper-granulation] [N/A:N/A] Necrotic Amount: [1:Small (1-33%)] [N/A:N/A] Exposed Structures: [1:Fat Layer (Subcutaneous Tissue) Exposed: Yes Fascia: No Tendon: No Muscle: No Joint: No Bone: No] [N/A:N/A] Epithelialization: [1:None] [N/A:N/A] Periwound Skin Texture: [1:Excoriation: No Induration: No] [N/A:N/A] Callus: No Crepitus: No Rash: No Scarring: No Periwound Skin Moisture: Maceration: No N/A N/A Dry/Scaly: No Periwound Skin Color: Atrophie Blanche: No N/A N/A Cyanosis: No Ecchymosis: No Erythema: No Hemosiderin Staining: No Mottled: No Pallor: No Rubor: No Tenderness on Palpation: No N/A N/A Wound Preparation: Ulcer Cleansing: N/A N/A Rinsed/Irrigated with  Saline Topical Anesthetic Applied: Other: lidocaine 4% Treatment Notes Electronic Signature(s) Signed: 01/05/2018 5:35:44 PM By: Montey Hora Entered By: Montey Hora on 01/05/2018 10:06:17 Davies, Carin Hock ([id number]) -------------------------------------------------------------------------------- Multi-Disciplinary Care Plan Details Patient Name: [name] Date of Service: 01/05/2018 10:00 AM Medical Record Number: [id number] Patient Account Number: [id number] Date of Birth/Sex: 30-Dec-1965 (52 y.o. M) Treating RN: Montey Hora Primary Care Yalexa Blust: SYSTEM, PCP Other Clinician: Referring Chaniqua Brisby: Referral, Self Treating Kristiane Morsch/Extender: STONE III, HOYT Weeks in Treatment: 1 Active Inactive ` Abuse / Safety / Falls / Self Care Management Nursing Diagnoses: History of Falls Goals: Patient will remain injury free related to falls Date Initiated: 12/29/2017 Target Resolution Date: 03/09/2018 Goal Status: Active Interventions: Assess fall risk on admission and as needed Notes: ` Orientation to the Wound Care Program Nursing Diagnoses: Knowledge deficit related to the wound healing center program Goals: Patient/caregiver will verbalize understanding of the [place] Program Date Initiated: 12/29/2017 Target Resolution Date: 03/09/2018 Goal Status: Active Interventions: Provide education on orientation to the wound center Notes: ` Wound/Skin Impairment Nursing Diagnoses: Impaired tissue integrity Goals: Ulcer/skin breakdown will heal within 14 weeks Date Initiated: 12/29/2017 Target Resolution Date: 03/09/2018 Goal Status: Active Interventions: ULICES, MAACK ([id number]) Assess patient/caregiver ability to obtain necessary supplies Assess patient/caregiver ability to perform ulcer/skin care regimen upon admission and as needed Assess ulceration(s) every visit Notes: Electronic Signature(s) Signed: 01/05/2018 5:35:44 PM By: Montey Hora Entered By: Montey Hora on 01/05/2018 10:06:10 Deas, Carin Hock ([id number]) -------------------------------------------------------------------------------- Pain Assessment Details Patient Name: [name] Date of Service: 01/05/2018 10:00 AM Medical Record Number: [id number] Patient Account Number: [id number] Date of Birth/Sex: November 19, 1965 (52 y.o. M) Treating RN: Montey Hora Primary Care Aileana Hodder: SYSTEM, PCP Other Clinician: Referring Breella Vanostrand: Referral, Self Treating Rajah Lamba/Extender: STONE III, HOYT Weeks in Treatment: 1 Active Problems Location of Pain Severity and Description of Pain Patient Has Paino Yes Site Locations Pain Location: Pain in Ulcers With Dressing Change: Yes Duration of the Pain. Constant / Intermittento Intermittent Pain Management and Medication Current Pain Management: Notes neuropathy Electronic Signature(s) Signed: 01/05/2018 5:35:44 PM By: Montey Hora Entered By: Montey Hora on 01/05/2018 09:59:15 Klare, Carin Hock ([id number]) -------------------------------------------------------------------------------- Patient/Caregiver Education Details Patient Name: [name] Date of Service: 01/05/2018 10:00 AM Medical Record Number: [id number] Patient Account Number: [id number] Date of Birth/Gender: Jan 04, 1966 (52 y.o. M) Treating RN: Montey Hora Primary Care Physician: SYSTEM, PCP Other Clinician: Referring Physician: Referral, Self Treating Physician/Extender: Melburn Hake, HOYT Weeks in Treatment: 1 Education Assessment Education Provided To: Patient Education Topics Provided Offloading: Handouts: Other: continue wearing offloading shoe Methods: Explain/Verbal Responses: State content correctly Wound/Skin Impairment: Handouts: Other: continue wound care as ordered Methods: Demonstration, Explain/Verbal Responses: State content correctly Electronic Signature(s) Signed: 01/05/2018 5:35:44 PM By: Marjory Lies,  Di Kindle Entered By: Montey Hora on 01/05/2018 10:25:03 Mcquire, Carin Hock ([id number]) -------------------------------------------------------------------------------- Wound Assessment Details Patient Name: [name] Date of Service: 01/05/2018 10:00 AM Medical Record Number: [id number] Patient Account Number: [id number] Date of Birth/Sex: [birth year] (52 y.o. M) Treating RN: Montey Hora Primary Care Mcguire Gasparyan: SYSTEM, PCP Other Clinician: Referring Yashua Bracco: Referral, Self Treating Saida Lonon/Extender: STONE III, HOYT Weeks in Treatment: 1 Wound Status Wound Number: 1 Primary Neuropathic Ulcer-Non Diabetic Etiology: Wound Location: Left Toe Great - Plantar Wound Open Wounding Event: Gradually Appeared Status: Date Acquired: 10/12/2017 Comorbid Cataracts, Asthma, Chronic Obstructive Weeks Of Treatment: 1 History: Pulmonary Disease (COPD), Sleep Apnea, Clustered Wound: No Neuropathy Wound Measurements Length: (cm) 0.9 Width: (cm) 1 Depth: (cm) 0.1 Area: (cm) 0.707 Volume: (cm) 0.071 % Reduction in Area: 43.8% % Reduction in Volume: 71.7% Epithelialization: None Tunneling: No Undermining: No Wound Description Full Thickness Without Exposed Support Classification: Structures Wound Margin: Flat and Intact Exudate Medium Amount: Exudate Type: Serous Exudate Color: amber Foul Odor After Cleansing: No Slough/Fibrino Yes Wound Bed Granulation Amount: Large (67-100%) Exposed Structure Granulation Quality: Pink, Hyper-granulation Fascia Exposed: No Necrotic Amount: Small (1-33%) Fat Layer (Subcutaneous Tissue) Exposed: Yes Necrotic Quality: Adherent Slough Tendon Exposed: No Muscle Exposed: No Joint Exposed: No Bone Exposed: No Periwound Skin Texture Texture Color No Abnormalities Noted: No No Abnormalities Noted: No Callus: No Atrophie Blanche: No Crepitus: No Cyanosis: No Excoriation: No Ecchymosis: No Induration: No Erythema: No Rash:  No Hemosiderin Staining: No Scarring: No Mottled: No Pallor: No Moisture Rubor: No No Abnormalities Noted: No Poppen, Levar T. ([id number]) Dry / Scaly: No Maceration: No Wound Preparation Ulcer Cleansing: Rinsed/Irrigated with Saline Topical Anesthetic Applied: Other: lidocaine 4%, Treatment Notes Wound #1 (Left, Plantar Toe Great) 1. Cleansed with: Clean wound with Normal Saline 2. Anesthetic Topical Lidocaine 4% cream to wound bed prior to debridement 4. Dressing Applied: Prisma Ag Other dressing (specify in notes) 6. Footwear/Offloading device applied Wedge shoe Notes drawtex with darco with peg assist Electronic Signature(s) Signed: 01/05/2018 5:35:44 PM By: Montey Hora Entered By: Montey Hora on 01/05/2018 10:05:36 [place], Carin Hock ([id number]) -------------------------------------------------------------------------------- Vitals Details Patient Name: [name] Date of Service: 01/05/2018 10:00 AM Medical Record Number: [id number] Patient Account Number: [id number] Date of Birth/Sex: Jun 16, 1965 (52 y.o. M) Treating RN: Montey Hora Primary Care Khaila Velarde: SYSTEM, PCP Other Clinician: Referring Mylani Gentry: Referral, Self Treating Debroah Shuttleworth/Extender: STONE III, HOYT Weeks in Treatment: 1 Vital Signs Time Taken: 10:03 Temperature (F): 97.5 Height (in): 74 Pulse (bpm): 57 Weight (lbs): 252 Respiratory Rate (breaths/min): 18 Body Mass Index (BMI): 32.4 Blood Pressure (mmHg): 140/68 Reference Range: 80 - 120 mg / dl Electronic Signature(s) Signed: 01/05/2018 5:35:44 PM By: Montey Hora Entered By: Montey Hora on 01/05/2018 10:03:42

## 2018-01-07 NOTE — Progress Notes (Signed)
INFANT, ZINK ([id number]) Visit Report for 01/05/2018 Chief Complaint Document Details Patient Name: [name], [name] Date of Service: 01/05/2018 10:00 AM Medical Record Number: [id number] Patient Account Number: [id number] Date of Birth/Sex: March 23, 1965 (52 y.o. M) Treating RN: Montey Hora Primary Care Provider: SYSTEM, PCP Other Clinician: Referring Provider: Referral, Self Treating Provider/Extender: Melburn Hake, Onesha Krebbs Weeks in Treatment: 1 Information Obtained from: Patient Chief Complaint Left 1st toe ulcer Electronic Signature(s) Signed: 01/05/2018 6:09:48 PM By: Worthy Keeler PA-C Entered By: Worthy Keeler on 01/05/2018 10:18:07 Geimer, Carin Hock ([id number]) -------------------------------------------------------------------------------- Debridement Details Patient Name: [name] Date of Service: 01/05/2018 10:00 AM Medical Record Number: [id number] Patient Account Number: [id number] Date of Birth/Sex: [birth year] (52 y.o. M) Treating RN: Montey Hora Primary Care Provider: SYSTEM, PCP Other Clinician: Referring Provider: Referral, Self Treating Provider/Extender: STONE III, Elijha Dedman Weeks in Treatment: 1 Debridement Performed for Wound #1 Left,Plantar Toe Great Assessment: Performed By: Physician STONE III, Isabel Ardila E., PA-C Debridement Type: Debridement Level of Consciousness (Pre- Awake and Alert procedure): Pre-procedure Verification/Time Yes - 10:19 Out Taken: Start Time: 10:19 Pain Control: Lidocaine 4% Topical Solution Total Area Debrided (L x W): 0.9 (cm) x 1 (cm) = 0.9 (cm) Tissue and other material Viable, Non-Viable, Callus, Slough, Subcutaneous, Slough debrided: Level: Skin/Subcutaneous Tissue Debridement Description: Excisional Instrument: Curette Bleeding: Minimum Hemostasis Achieved: Pressure End Time: 10:23 Procedural Pain: 0 Post Procedural Pain: 0 Response to Treatment: Procedure was tolerated well Level of  Consciousness Awake and Alert (Post-procedure): Post Debridement Measurements of Total Wound Length: (cm) 0.9 Width: (cm) 1 Depth: (cm) 0.2 Volume: (cm) 0.141 Character of Wound/Ulcer Post Debridement: Improved Post Procedure Diagnosis Same as Pre-procedure Electronic Signature(s) Signed: 01/05/2018 5:35:44 PM By: Montey Hora Signed: 01/05/2018 6:09:48 PM By: Worthy Keeler PA-C Entered By: Montey Hora on 01/05/2018 10:23:55 Vandeven, Carin Hock ([id number]) -------------------------------------------------------------------------------- HPI Details Patient Name: [name] Date of Service: 01/05/2018 10:00 AM Medical Record Number: [id number] Patient Account Number: [id number] Date of Birth/Sex: 05/16/1965 (52 y.o. M) Treating RN: Montey Hora Primary Care Provider: SYSTEM, PCP Other Clinician: Referring Provider: Referral, Self Treating Provider/Extender: STONE III, Zarya Lasseigne Weeks in Treatment: 1 History of Present Illness HPI Description: 12/29/17 on evaluation today patient presents for an injury due to a trauma on the left great toe plantar aspect. 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 We did review his notes and records as well and there appears to be no evidence of a formal diagnosis of diabetes. 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 At this point the patient has no pain in regard to the wound on his left great toe. In general he seems to be doing excellent in this regard. Nonetheless he does have a wound with callous surrounding there appears to be some new epithelialization but in general the healing seems to be very slow according to the patient. He was seen in the ER at [facility] on 11/24/17 where he was prescribed Bactrim along with Keflex for infection. That has been  completed at this point. No fevers, chills, nausea, or vomiting noted at this time. 01/05/18 on evaluation today patient actually appears to be doing rather well in regard to the left great toe ulcer. He has been tolerating the dressing changes without complication. Fortunately there does not appear to be any evidence  of infection at this time. No fevers, chills, nausea, or vomiting noted at this time. I did review patient's x-ray which she did have in the interim since I last saw him. This was negative for any signs of acute osteomyelitis or bony injury. Electronic Signature(s) Signed: 01/05/2018 6:09:48 PM By: Worthy Keeler PA-C Entered By: Worthy Keeler on 01/05/2018 10:49:50 Callan, Carin Hock ([id number]) -------------------------------------------------------------------------------- Physical Exam Details Patient Name: [name] Date of Service: 01/05/2018 10:00 AM Medical Record Number: [id number] Patient Account Number: [id number] Date of Birth/Sex: 04/07/1965 (52 y.o. M) Treating RN: Montey Hora Primary Care Provider: SYSTEM, PCP Other Clinician: Referring Provider: Referral, Self Treating Provider/Extender: STONE III, Mckinze Poirier Weeks in Treatment: 1 Constitutional Well-nourished and well-hydrated in no acute distress. Respiratory normal breathing without difficulty. Psychiatric this patient is able to make decisions and demonstrates good insight into disease process. Alert and Oriented x 3. pleasant and cooperative. Notes On inspection patient's wound bed did have some Slough and biofilm noted on the surface of the wound which was sharply debrided away. He tolerated this today without complication post debridement the wound bed appears to be doing significantly better which is great news. Electronic Signature(s) Signed: 01/05/2018 6:09:48 PM By: Worthy Keeler PA-C Entered By: Worthy Keeler on 01/05/2018 10:49:16 Rettinger, Carin Hock  ([id number]) -------------------------------------------------------------------------------- Physician Orders Details Patient Name: [name] Date of Service: 01/05/2018 10:00 AM Medical Record Number: [id number] Patient Account Number: [id number] Date of Birth/Sex: Feb 24, 1966 (52 y.o. 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 o May Shower, gently pat wound dry prior to applying new dressing. Anesthetic (add to Medication List) Wound #1 Left,Plantar Toe Great o Topical Lidocaine 4% cream applied to wound bed prior to debridement (In Clinic Only). Primary Wound Dressing Wound #1 Left,Plantar Toe Great o Silver Collagen Secondary Dressing Wound #1 Left,Plantar Toe Great o Conform/Kerlix o Drawtex Dressing Change Frequency Wound #1 Left,Plantar Toe Great o Change dressing every day. Follow-up Appointments Wound #1 Left,Plantar Toe Great o Return Appointment in 2 weeks. Off-Loading Wound #1 Left,Plantar Toe Great o Other: - Darco shoe with peg assist Electronic Signature(s) Signed: 01/05/2018 5:35:44 PM By: Damaris Schooner ([id number]) Signed: 01/05/2018 6:09:48 PM By: Worthy Keeler PA-C Entered By: Montey Hora on 01/05/2018 10:31:54 Lippe, Carin Hock ([id number]) -------------------------------------------------------------------------------- Problem List Details Patient Name: [name] Date of Service: 01/05/2018  10:00 AM Medical Record Number: [id number] Patient Account Number: [id number] Date of Birth/Sex: Apr 16, 1965 (52 y.o. M) Treating RN: Montey Hora Primary Care Provider: SYSTEM, PCP Other Clinician: Referring Provider: Referral, Self Treating Provider/Extender: STONE III, Ravleen Ries Weeks in Treatment: 1 Active Problems ICD-10 Evaluated Encounter Code Description Active Date Today Diagnosis G90.09 Other idiopathic peripheral autonomic neuropathy 12/29/2017 No Yes L97.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 of other part of left foot with fat 12/29/2017 No Yes layer exposed [place] (primary) hypertension 12/29/2017 No Yes G47.30 Sleep apnea, unspecified 12/29/2017 No Yes Inactive Problems Resolved Problems Electronic Signature(s) Signed: 01/05/2018 6:09:48 PM By: Worthy Keeler PA-C Entered By: Worthy Keeler on 01/05/2018 10:18:02 Jafri, Carin Hock ([id number]) -------------------------------------------------------------------------------- Progress Note  Details Patient Name: DEADRIAN, TOYA Date of Service: 01/05/2018 10:00 AM Medical Record Number: [id number] Patient Account Number: [id number] Date of Birth/Sex: May 02, 1965 (52 y.o. M) Treating RN: Montey Hora Primary Care Provider: SYSTEM, PCP Other Clinician: Referring Provider: Referral, Self Treating Provider/Extender: STONE III, Nekita Pita Weeks in Treatment: 1 Subjective Chief Complaint Information obtained from Patient Left 1st toe ulcer History of Present Illness (HPI) 12/29/17 on evaluation today patient presents for an injury due to a trauma on the left great toe plantar aspect. 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 We did review his notes and records as well and there appears to be no evidence of a formal diagnosis of diabetes. He does have instructive sleep apnea for which she is on  the CPAP machine he also has hypertension. At this point the patient has no pain in regard to the wound on his left great toe. In general he seems to be doing excellent in this regard. Nonetheless he does have a wound with callous surrounding there appears to be some new epithelialization but in general the healing seems to be very slow according to the patient. He was seen in the ER at [facility] on 11/24/17 where he was prescribed Bactrim along with Keflex for infection. That has been completed at this point. No fevers, chills, nausea, or vomiting noted at this time. 01/05/18 on evaluation today patient actually appears to be doing rather well in regard to the left great toe ulcer. He has been tolerating the dressing changes without complication. Fortunately there does not appear to be any evidence of infection at this time. No fevers, chills, nausea, or vomiting noted at this time. I did review patient's x-ray which she did have in the interim since I last saw him. This was negative for any signs of acute osteomyelitis or bony injury. Patient History Information obtained from Patient. Family History Cancer - Mother,Father, Diabetes - Mother, Hypertension - Father, No family history of Kidney Disease, Lung Disease, Seizures, Stroke, Thyroid Problems, Tuberculosis. Social History Current every day smoker - 15 years, Marital Status - Single, Alcohol Use - Moderate, Drug Use - No History, Caffeine Use - Never. Medical And Surgical History Notes Endocrine Borderline Review of Systems (ROS) Constitutional Symptoms (General Health) Denies complaints or symptoms of Fever, Chills. Respiratory The patient has no complaints or symptoms. Cardiovascular The patient has no complaints or symptoms. Psychiatric The patient has no complaints or symptoms. HARITH, MCCADDEN ([id number]) Objective Constitutional Well-nourished and well-hydrated in no acute distress. Vitals Time Taken: 10:03 AM, Height: 74  in, Weight: 252 lbs, BMI: 32.4, Temperature: 97.5 F, Pulse: 57 bpm, Respiratory Rate: 18 breaths/min, Blood Pressure: 140/68 mmHg. Respiratory normal breathing without difficulty. Psychiatric this patient is able to make decisions and demonstrates good insight into disease process. Alert and Oriented x 3. pleasant and cooperative. General Notes: On inspection patient's wound bed did have some Slough and biofilm noted on the surface of the wound which was sharply debrided away. He tolerated this today without complication post debridement the wound bed appears to be doing significantly better which is great news. Integumentary (Hair, Skin) Wound #1 status is Open. Original cause of wound was Gradually Appeared. The wound is located on the [employer]. The wound measures 0.9cm length x 1cm width x 0.1cm depth; 0.707cm^2 area and 0.071cm^3 volume. There is Fat Layer (Subcutaneous  Tissue) Exposed exposed. There is no tunneling or undermining noted. There is a medium amount of serous drainage noted. The wound margin is flat and intact. There is large (67-100%) pink, hyper - granulation within the wound bed. There is a small (1-33%) amount of necrotic tissue within the wound bed including Adherent Slough. The periwound skin appearance did not exhibit: Callus, Crepitus, Excoriation, Induration, Rash, Scarring, Dry/Scaly, Maceration, Atrophie Blanche, Cyanosis, Ecchymosis, Hemosiderin Staining, Mottled, Pallor, Rubor, Erythema. Assessment Active Problems ICD-10 Other idiopathic peripheral autonomic neuropathy Non-pressure chronic ulcer of other part of left foot with fat layer exposed Essential (primary) hypertension Sleep apnea, unspecified Procedures Dalia, Zymier T. ([id number]) Wound #1 Pre-procedure diagnosis of Wound #1 is a Neuropathic Ulcer-Non Diabetic located on the Left,Plantar Toe Great . There was a Excisional Skin/Subcutaneous Tissue Debridement with a total area of  0.9 sq cm performed by STONE III, Artis Beggs E., PA-C. With the following instrument(s): Curette to remove Viable and Non-Viable tissue/material. Material removed includes Callus, Subcutaneous Tissue, and Slough after achieving pain control using Lidocaine 4% Topical Solution. No specimens were taken. A time out was conducted at 10:19, prior to the start of the procedure. A Minimum amount of bleeding was controlled with Pressure. The procedure was tolerated well with a pain level of 0 throughout and a pain level of 0 following the procedure. Post Debridement Measurements: 0.9cm length x 1cm width x 0.2cm depth; 0.141cm^3 volume. Character of Wound/Ulcer Post Debridement is improved. Post procedure Diagnosis Wound #1: Same as Pre-Procedure Plan Wound Cleansing: Wound #1 Left,Plantar Toe Great: Clean wound with Normal Saline. May Shower, gently pat wound dry prior to applying new dressing. Anesthetic (add to Medication List): Wound #1 Left,Plantar Toe Great: Topical Lidocaine 4% cream applied to wound bed prior to debridement (In Clinic Only). Primary Wound Dressing: Wound #1 Left,Plantar Toe Great: Silver Collagen Secondary Dressing: Wound #1 Left,Plantar Toe Great: Conform/Kerlix Drawtex Dressing Change Frequency: Wound #1 Left,Plantar Toe Great: Change dressing every day. Follow-up Appointments: Wound #1 Left,Plantar Toe Great: Return Appointment in 2 weeks. Off-Loading: Wound #1 Left,Plantar Toe Great: Other: - Darco shoe with peg assist At this point my suggestion is going to be that we initiating continue the above wound care measures for the next week. The patient is in agreement with the plan. I did advise him that he needs to be wearing his offloading shoe at all times that if you wants this wound to heal that is going to be his best friend. It did come to my attention that he's not wearing it when is at home only when he's out and about. I think that's going to be subpar as far  as getting this to heal. He understands. We will subsequently see him back for reevaluation in two weeks time. Please see above for specific wound care orders. We will see patient for re-evaluation in 2 week(s) here in the clinic. If anything worsens or changes patient will contact our office for additional recommendations. Electronic Signature(s) Signed: 01/05/2018 6:09:48 PM By: Irean Hong Asano, [place] ([id number]) Entered By: Worthy Keeler on 01/05/2018 10:50:55 Schembri, Carin Hock ([id number]) -------------------------------------------------------------------------------- ROS/PFSH Details Patient Name: [name] Date of Service: 01/05/2018 10:00 AM Medical Record Number: [id number] Patient Account Number: [id number] Date of Birth/Sex: [birth year] (52 y.o. M) Treating RN: Montey Hora Primary Care Provider: SYSTEM, PCP Other Clinician: Referring Provider: Referral, Self Treating Provider/Extender: STONE III, Melayna Robarts Weeks in Treatment: 1 Information Obtained From Patient Wound History Do you currently have one or  more open woundso Yes How many open wounds do you currently haveo 1 Approximately how long have you had your woundso 2 months How have you been treating your wound(s) until nowo neosporin Has your wound(s) ever healed and then re-openedo No Have you had any lab work done in the past montho Yes Who ordered the lab work doneo Dr. Ivar Bury Have you tested positive for an antibiotic resistant organism (MRSA, VRE)o No Have you tested positive for osteomyelitis (bone infection)o No Have you had any tests for circulation on your legso No Constitutional Symptoms (General Health) Complaints and Symptoms: Negative for: Fever; Chills Eyes Medical History: Positive for: Cataracts - Left Negative for: Glaucoma; Optic Neuritis Ear/Nose/Mouth/Throat Medical History: Negative for: Chronic sinus problems/congestion; Middle ear  problems Hematologic/Lymphatic Medical History: Negative for: Anemia; Hemophilia; Human Immunodeficiency Virus; Sickle Cell Disease Respiratory Complaints and Symptoms: No Complaints or Symptoms Medical History: Positive for: Asthma; Chronic Obstructive Pulmonary Disease (COPD); Sleep Apnea - C-pap Negative for: Aspiration; Pneumothorax; Tuberculosis Cardiovascular Complaints and Symptoms: No Complaints or Symptoms Medical HistoryWHITTAKER, LENIS ([id number]) Negative for: Angina; Arrhythmia; Congestive Heart Failure; Coronary Artery Disease; Deep Vein Thrombosis; Hypertension; Hypotension; Myocardial Infarction; Peripheral Arterial Disease; Peripheral Venous Disease; Phlebitis; Vasculitis Gastrointestinal Medical History: Negative for: Cirrhosis ; Colitis; Crohnos; Hepatitis A; Hepatitis B; Hepatitis C Endocrine Medical History: Negative for: Type I Diabetes; Type II Diabetes Past Medical History Notes: Borderline Integumentary (Skin) Medical History: Negative for: History of Burn; History of pressure wounds Musculoskeletal Medical History: Negative for: Gout; Rheumatoid Arthritis; Osteoarthritis; Osteomyelitis Neurologic Medical History: Positive for: Neuropathy - Feet Negative for: Dementia; Quadriplegia; Paraplegia; Seizure Disorder Oncologic Medical History: Negative for: Received Chemotherapy; Received Radiation Psychiatric Complaints and Symptoms: No Complaints or Symptoms Medical History: Negative for: Anorexia/bulimia; Confinement Anxiety HBO Extended History Items Eyes: Cataracts Immunizations Pneumococcal Vaccine: Received Pneumococcal Vaccination: No Implantable Devices Family and Social History Cancer: Yes - Mother,Father; Diabetes: Yes - Mother; Hypertension: Yes - Father; Kidney Disease: No; Lung Disease: No; Seizures: No; Stroke: No; Thyroid Problems: No; Tuberculosis: No; Current every day smoker - 15 years; Marital Status - Eschbach, Klinton T.  ([id number]) Single; Alcohol Use: Moderate; Drug Use: No History; Caffeine Use: Never; Advanced Directives: No; Patient does not want information on Advanced Directives; Do not resuscitate: No; Living Will: No; Medical Power of Attorney: No Physician Affirmation I have reviewed and agree with the above information. Electronic Signature(s) Signed: 01/05/2018 5:35:44 PM By: Montey Hora Signed: 01/05/2018 6:09:48 PM By: Worthy Keeler PA-C Entered By: Worthy Keeler on 01/05/2018 10:49:02 Odea, Carin Hock ([id number]) -------------------------------------------------------------------------------- SuperBill Details Patient Name: [name] Date of Service: 01/05/2018 Medical Record Number: [id number] Patient Account Number: [id number] Date of Birth/Sex: 07-28-1965 (52 y.o. 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 I10 Essential (primary) hypertension G47.30 Sleep apnea, unspecified Facility Procedures CPT4 Code: [id number] Description: 11042 - DEB SUBQ TISSUE 20 SQ CM/< ICD-10 Diagnosis Description L97.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 of other part of left foot with fat Modifier: layer exposed Quantity: 1 Physician Procedures CPT4 Code: [id number] Description: 11042 - WC PHYS SUBQ TISS 20 SQ CM ICD-10 Diagnosis Description L97.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 of other part of left foot with fat Modifier: layer exposed Quantity: 1 Electronic Signature(s) Signed: 01/05/2018 6:09:48 PM By: Joaquim Lai  III, Lindora Alviar PA-C Entered By: Worthy Keeler on 01/05/2018 10:51:04

## 2018-01-19 ENCOUNTER — Encounter: Payer: Medicaid Other | Attending: Physician Assistant | Admitting: Physician Assistant

## 2018-01-19 DIAGNOSIS — Z8249 Family history of ischemic heart disease and other diseases of the circulatory system: Secondary | ICD-10-CM | POA: Diagnosis not present

## 2018-01-19 DIAGNOSIS — L97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 of other part of left foot with fat layer exposed: Secondary | ICD-10-CM | POA: Insufficient documentation

## 2018-01-19 DIAGNOSIS — I10 Essential (primary) hypertension: Secondary | ICD-10-CM | POA: Insufficient documentation

## 2018-01-19 DIAGNOSIS — Z809 Family history of malignant neoplasm, unspecified: Secondary | ICD-10-CM | POA: Insufficient documentation

## 2018-01-19 DIAGNOSIS — J449 Chronic obstructive pulmonary disease, unspecified: Secondary | ICD-10-CM | POA: Insufficient documentation

## 2018-01-19 DIAGNOSIS — E11621 Type 2 diabetes mellitus with foot ulcer: Secondary | ICD-10-CM | POA: Insufficient documentation

## 2018-01-19 DIAGNOSIS — G4730 Sleep apnea, unspecified: Secondary | ICD-10-CM | POA: Diagnosis not present

## 2018-01-19 DIAGNOSIS — G9009 Other idiopathic peripheral autonomic neuropathy: Secondary | ICD-10-CM | POA: Insufficient documentation

## 2018-01-19 DIAGNOSIS — F17200 Nicotine dependence, unspecified, uncomplicated: Secondary | ICD-10-CM | POA: Insufficient documentation

## 2018-01-19 DIAGNOSIS — E1140 Type 2 diabetes mellitus with diabetic neuropathy, unspecified: Secondary | ICD-10-CM | POA: Insufficient documentation

## 2018-01-22 NOTE — Progress Notes (Signed)
DENALI, BECVAR ([id number]) Visit Report for 01/19/2018 Arrival Information Details Patient Name: [name], [name] Date of Service: 01/19/2018 9:30 AM Medical Record Number: [id number] Patient Account Number: [id number] Date of Birth/Sex: 1966/02/06 (52 y.o. M) Treating RN: Montey Hora Primary Care Brittley Regner: SYSTEM, PCP Other Clinician: Referring Daksh Coates: Referral, Self Treating Amila Callies/Extender: STONE III, HOYT Weeks in Treatment: 3 Visit Information History Since Last Visit Added or deleted any medications: No Patient Arrived: Ambulatory Any new allergies or adverse reactions: No Arrival Time: 09:29 Had a fall or experienced change in No Accompanied By: self activities of daily living that may affect Transfer Assistance: None risk of falls: Patient Identification Verified: Yes Signs or symptoms of abuse/neglect since last visito No Secondary Verification Process Yes Hospitalized since last visit: No Completed: Implantable device outside of the clinic excluding No Patient Has Alerts: Yes cellular tissue based products placed in the center Patient Alerts: Borderline since last visit: Diabetic Has Dressing in Place as Prescribed: Yes Pain Present Now: No Electronic Signature(s) Signed: 01/19/2018 4:11:24 PM By: Lorine Bears RCP, RRT, CHT Entered By: Lorine Bears on 01/19/2018 09:30:22 Smithey, [name] ([id number]) -------------------------------------------------------------------------------- Encounter Discharge Information Details Patient Name: [name] Date of Service: 01/19/2018 9:30 AM Medical Record Number: [id number] Patient Account Number: [id number] Date of Birth/Sex: Aug 17, 1965 (52 y.o. M) Treating RN: Montey Hora Primary Care Zayla Agar: SYSTEM, PCP Other Clinician: Referring Emie Sommerfeld: Referral, Self Treating Jaylianna Tatlock/Extender: STONE III, HOYT Weeks in Treatment: 3 Encounter Discharge Information Items Post  Procedure Vitals Discharge Condition: Stable Temperature (F): 97.5 Ambulatory Status: Ambulatory Pulse (bpm): 51 Discharge Destination: Home Respiratory Rate (breaths/min): 16 Transportation: Private Auto Blood Pressure (mmHg): 121/85 Accompanied By: self Schedule Follow-up Appointment: Yes Clinical Summary of Care: Electronic Signature(s) Signed: 01/19/2018 5:34:16 PM By: Montey Hora Entered By: Montey Hora on 01/19/2018 10:31:03 Lorah, [name] ([id number]) -------------------------------------------------------------------------------- Lower Extremity Assessment Details Patient Name: [name] Date of Service: 01/19/2018 9:30 AM Medical Record Number: [id number] Patient Account Number: [id number] Date of Birth/Sex: 01-May-1965 (52 y.o. M) Treating RN: Cornell Barman Primary Care Eduin Friedel: SYSTEM, PCP Other Clinician: Referring Gray Doering: Referral, Self Treating Kartik Fernando/Extender: STONE III, HOYT Weeks in Treatment: 3 Edema Assessment Assessed: [Left: No] [Right: No] Edema: [Left: N] [Right: o] Vascular Assessment Pulses: Dorsalis Pedis Palpable: [Left:Yes] Posterior Tibial Extremity colors, hair growth, and conditions: Extremity Color: [Left:Normal] Hair Growth on Extremity: [Left:Yes] Temperature of Extremity: [Left:Warm] Capillary Refill: [Left:< 3 seconds] Toe Nail Assessment Left: Right: Thick: No Discolored: No Deformed: No Improper Length and Hygiene: No Electronic Signature(s) Signed: 01/19/2018 5:51:21 PM By: Gretta Cool, BSN, RN, CWS, Kim RN, BSN Entered By: Gretta Cool, BSN, RN, CWS, Kim on 01/19/2018 09:40:42 Podolsky, [name] ([id number]) -------------------------------------------------------------------------------- Multi Wound Chart Details Patient Name: [name] Date of Service: 01/19/2018 9:30 AM Medical Record Number: [id number] Patient Account Number: [id number] Date of Birth/Sex: 30-Aug-1965 (52 y.o. 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] [N/A:N/A] Comorbid History: [1:Cataracts, Asthma, Chronic Obstructive Pulmonary Disease (COPD), Sleep Apnea, Neuropathy] [N/A:N/A] Date Acquired: [1:10/12/2017] [N/A:N/A] Weeks of Treatment: [1:3] [N/A:N/A] Wound Status: [1:Open] [N/A:N/A] Measurements L x W x D [1:0.5x0.8x0.1] [N/A:N/A] (cm) Area (cm) : [1:0.314] [N/A:N/A] Volume (  cm) : [1:0.031] [N/A:N/A] % Reduction in Area: [1:75.00%] [N/A:N/A] % Reduction in Volume: [1:87.60%] [N/A:N/A] Starting Position 1 [1:12] (o'clock): Ending Position 1 [1:12] (o'clock): Maximum Distance 1 (cm): [1:0.1] Undermining: [1:Yes] [N/A:N/A] Classification: [1:Full Thickness Without Exposed Support Structures] [N/A:N/A] Exudate Amount: [1:Medium] [N/A:N/A] Exudate Type: [1:Serous] [N/A:N/A] Exudate Color: [1:amber] [N/A:N/A] Wound Margin: [1:Flat and Intact] [N/A:N/A] Granulation Amount: [1:Large (67-100%)] [N/A:N/A] Granulation Quality: [1:Pink, Hyper-granulation] [N/A:N/A] Necrotic Amount: [1:Small (1-33%)] [N/A:N/A] Exposed Structures: [1:Fat Layer (Subcutaneous Tissue) Exposed: Yes Fascia: No Tendon: No] [N/A:N/A] Muscle: No Joint: No Bone: No Epithelialization: None N/A N/A Periwound Skin Texture: Callus: Yes N/A N/A Excoriation: No Induration: No Crepitus: No Rash: No Scarring: No Periwound Skin Moisture: Maceration: No N/A N/A Dry/Scaly: No Periwound Skin Color: Atrophie Blanche: No N/A N/A Cyanosis:  No Ecchymosis: No Erythema: No Hemosiderin Staining: No Mottled: No Pallor: No Rubor: No Tenderness on Palpation: No N/A N/A Wound Preparation: Ulcer Cleansing: N/A N/A Rinsed/Irrigated with Saline Topical Anesthetic Applied: Other: lidocaine 4% Treatment Notes Electronic Signature(s) Signed: 01/19/2018 5:34:16 PM By: Montey Hora Entered By: Montey Hora on 01/19/2018 10:08:56 [name], [name] ([id number]) -------------------------------------------------------------------------------- Multi-Disciplinary Care Plan Details Patient Name: [name] Date of Service: 01/19/2018 9:30 AM Medical Record Number: [id number] Patient Account Number: [id number] Date of Birth/Sex: 1966/02/25 (52 y.o. M) Treating RN: Montey Hora Primary Care Surafel Hilleary: SYSTEM, PCP Other Clinician: Referring Mica Ramdass: Referral, Self Treating Nelton Amsden/Extender: STONE III, HOYT Weeks in Treatment: 3 Active Inactive ` Abuse / Safety / Falls / Self Care Management Nursing Diagnoses: History of Falls Goals: Patient will remain injury free related to falls Date Initiated: 12/29/2017 Target Resolution Date: 03/09/2018 Goal Status: Active Interventions: Assess fall risk on admission and as needed Notes: ` Orientation to the Wound Care Program Nursing Diagnoses: Knowledge deficit related to the wound healing center program Goals: Patient/caregiver will verbalize understanding of the [facility] Date Initiated: 12/29/2017 Target Resolution Date: 03/09/2018 Goal Status: Active Interventions: Provide education on orientation to the wound center Notes: ` Wound/Skin Impairment Nursing Diagnoses: Impaired tissue integrity Goals: Ulcer/skin breakdown will heal within 14 weeks Date Initiated: 12/29/2017 Target Resolution Date: 03/09/2018 Goal Status: Active Interventions: [name], [name] ([id number]) Assess patient/caregiver ability to obtain necessary supplies Assess  patient/caregiver ability to perform ulcer/skin care regimen upon admission and as needed Assess ulceration(s) every visit Notes: Electronic Signature(s) Signed: 01/19/2018 5:34:16 PM By: Montey Hora Entered By: Montey Hora on 01/19/2018 10:08:49 [name], [name] ([id number]) -------------------------------------------------------------------------------- Pain Assessment Details Patient Name: [name] Date of Service: 01/19/2018 9:30 AM Medical Record Number: [id number] Patient Account Number: [id number] Date of Birth/Sex: Jul 13, 1965 (52 y.o. M) Treating RN: Montey Hora Primary Care Andersson Larrabee: SYSTEM, PCP Other Clinician: Referring Tanay Massiah: Referral, Self Treating Celie Desrochers/Extender: STONE III, HOYT Weeks in Treatment: 3 Active Problems Location of Pain Severity and Description of Pain Patient Has Paino No Site Locations Pain Management and Medication Current Pain Management: Electronic Signature(s) Signed: 01/19/2018 4:11:24 PM By: Lorine Bears RCP, RRT, CHT Signed: 01/19/2018 5:34:16 PM By: Montey Hora Entered By: Lorine Bears on 01/19/2018 09:30:28 [name], [name] ([id number]) -------------------------------------------------------------------------------- Patient/Caregiver Education Details Patient Name: [name] Date of Service: 01/19/2018 9:30 AM Medical Record Number: [id number] Patient Account Number: [id number] Date of Birth/Gender: 19-Nov-1965 (52 y.o. M) Treating RN: Montey Hora Primary Care Physician: SYSTEM, PCP Other Clinician: Referring Physician: Referral, Self Treating Physician/Extender: Melburn Hake, HOYT Weeks in Treatment: 3 Education Assessment Education Provided To: Patient Education Topics Provided Offloading: Handouts: Other: use felt Methods: Demonstration, Explain/Verbal Responses: State  content correctly Electronic Signature(s) Signed: 01/19/2018 5:34:16 PM By: Montey Hora Entered  By: Montey Hora on 01/19/2018 10:29:29 [name], [name] ([id number]) -------------------------------------------------------------------------------- Wound Assessment Details Patient Name: [name] Date of Service: 01/19/2018 9:30 AM Medical Record Number: [id number] Patient Account Number: [id number] Date of Birth/Sex: [birth year] (52 y.o. M) Treating RN: Cornell Barman Primary Care Anselmo Reihl: SYSTEM, PCP Other Clinician: Referring Biruk Troia: Referral, Self Treating Addisyn Leclaire/Extender: STONE III, HOYT Weeks in Treatment: 3 Wound Status Wound Number: 1 Primary Neuropathic Ulcer-Non Diabetic Etiology: Wound Location: Left Toe Great - Plantar Wound Open Wounding Event: Gradually Appeared Status: Date Acquired: 10/12/2017 Comorbid Cataracts, Asthma, Chronic Obstructive Weeks Of Treatment: 3 History: Pulmonary Disease (COPD), Sleep Apnea, Clustered Wound: No Neuropathy Photos Photo Uploaded By: Gretta Cool, BSN, RN, CWS, Kim on 01/19/2018 10:55:31 Wound Measurements Length: (cm) 0.5 Width: (cm) 0.8 Depth: (cm) 0.1 Area: (cm) 0.314 Volume: (cm) 0.031 % Reduction in Area: 75% % Reduction in Volume: 87.6% Epithelialization: None Undermining: Yes Starting Position (o'clock): 12 Ending Position (o'clock): 12 Maximum Distance: (cm) 0.1 Wound Description Full Thickness Without Exposed Support Foul Odo Classification: Structures Slough/F Wound Margin: Flat and Intact Exudate Medium Amount: Exudate Type: Serous Exudate Color: amber r After Cleansing: No ibrino Yes Wound Bed Granulation Amount: Large (67-100%) Exposed Structure Granulation Quality: Pink, Hyper-granulation Fascia Exposed: No Necrotic Amount: Small (1-33%) Fat Layer (Subcutaneous Tissue) Exposed: Yes Necrotic Quality: Adherent Slough Tendon Exposed: No [name], [name] T. ([id number]) Muscle Exposed: No Joint Exposed: No Bone Exposed: No Periwound Skin Texture Texture Color No Abnormalities Noted:  No No Abnormalities Noted: No Callus: Yes Atrophie Blanche: No Crepitus: No Cyanosis: No Excoriation: No Ecchymosis: No Induration: No Erythema: No Rash: No Hemosiderin Staining: No Scarring: No Mottled: No Pallor: No Moisture Rubor: No No Abnormalities Noted: No Dry / Scaly: No Maceration: No Wound Preparation Ulcer Cleansing: Rinsed/Irrigated with Saline Topical Anesthetic Applied: Other: lidocaine 4%, Treatment Notes Wound #1 (Left, Plantar Toe Great) Notes prisma, drawtex with darco with peg assist and felt for offloading Electronic Signature(s) Signed: 01/19/2018 5:51:21 PM By: Gretta Cool, BSN, RN, CWS, Kim RN, BSN Entered By: Gretta Cool, BSN, RN, CWS, Kim on 01/19/2018 09:39:45 [name], [name] ([id number]) -------------------------------------------------------------------------------- Vitals Details Patient Name: [name] Date of Service: 01/19/2018 9:30 AM Medical Record Number: [id number] Patient Account Number: [id number] Date of Birth/Sex: March 23, 1965 (52 y.o. M) Treating RN: Montey Hora Primary Care Shebra Muldrow: SYSTEM, PCP Other Clinician: Referring Avelardo Reesman: Referral, Self Treating Huntley Demedeiros/Extender: STONE III, HOYT Weeks in Treatment: 3 Vital Signs Time Taken: 09:30 Temperature (F): 97.5 Height (in): 74 Pulse (bpm): 51 Weight (lbs): 252 Respiratory Rate (breaths/min): 18 Body Mass Index (BMI): 32.4 Blood Pressure (mmHg): 121/84 Reference Range: 80 - 120 mg / dl Electronic Signature(s) Signed: 01/19/2018 4:11:24 PM By: Lorine Bears RCP, RRT, CHT Entered By: Lorine Bears on 01/19/2018 09:33:11

## 2018-01-22 NOTE — Progress Notes (Signed)
TRAXTON, KOLENDA ([id number]) Visit Report for 01/19/2018 Chief Complaint Document Details Patient Name: [name], [name] Date of Service: 01/19/2018 9:30 AM Medical Record Number: [id number] Patient Account Number: [id number] Date of Birth/Sex: December 19, 1965 (52 y.o. M) Treating RN: Montey Hora Primary Care Provider: SYSTEM, PCP Other Clinician: Referring Provider: Referral, Self Treating Provider/Extender: Melburn Hake, Adia Crammer Weeks in Treatment: 3 Information Obtained from: Patient Chief Complaint Left 1st toe ulcer Electronic Signature(s) Signed: 01/19/2018 1:14:28 PM By: Worthy Keeler PA-C Entered By: Worthy Keeler on 01/19/2018 10:05:20 Meditz, Carin Hock ([id number]) -------------------------------------------------------------------------------- Debridement Details Patient Name: [name] Date of Service: 01/19/2018 9:30 AM Medical Record Number: [id number] Patient Account Number: [id number] Date of Birth/Sex: [birth year] (52 y.o. M) Treating RN: Montey Hora Primary Care Provider: SYSTEM, PCP Other Clinician: Referring Provider: Referral, Self Treating Provider/Extender: STONE III, Socorro Kanitz Weeks in Treatment: 3 Debridement Performed for Wound #1 Left,Plantar Toe Great Assessment: Performed By: Physician STONE III, Gareth Fitzner E., PA-C Debridement Type: Debridement Level of Consciousness (Pre- Awake and Alert procedure): Pre-procedure Verification/Time Yes - 10:08 Out Taken: Start Time: 10:08 Pain Control: Lidocaine 4% Topical Solution Total Area Debrided (L x W): 0.5 (cm) x 0.8 (cm) = 0.4 (cm) Tissue and other material Viable, Non-Viable, Callus, Slough, Subcutaneous, Slough debrided: Level: Skin/Subcutaneous Tissue Debridement Description: Excisional Instrument: Curette Bleeding: Minimum Hemostasis Achieved: Pressure End Time: 10:12 Procedural Pain: 0 Post Procedural Pain: 0 Response to Treatment: Procedure was tolerated well Level of Consciousness Awake  and Alert (Post-procedure): Post Debridement Measurements of Total Wound Length: (cm) 0.6 Width: (cm) 0.9 Depth: (cm) 0.2 Volume: (cm) 0.085 Character of Wound/Ulcer Post Debridement: Improved Post Procedure Diagnosis Same as Pre-procedure Electronic Signature(s) Signed: 01/19/2018 1:14:28 PM By: Worthy Keeler PA-C Signed: 01/19/2018 5:34:16 PM By: Montey Hora Entered By: Montey Hora on 01/19/2018 10:12:12 Moes, Carin Hock ([id number]) -------------------------------------------------------------------------------- HPI Details Patient Name: [name] Date of Service: 01/19/2018 9:30 AM Medical Record Number: [id number] Patient Account Number: [id number] Date of Birth/Sex: 1966/02/25 (52 y.o. M) Treating RN: Montey Hora Primary Care Provider: SYSTEM, PCP Other Clinician: Referring Provider: Referral, Self Treating Provider/Extender: Melburn Hake, Marlowe Lawes Weeks in Treatment: 3 History of Present Illness HPI Description: 12/29/17 on evaluation today patient presents for an injury due to a trauma on the left great toe plantar aspect. 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 We did review his notes and records as well and there appears to be no evidence of a formal diagnosis of diabetes. 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 At this point the patient has no pain in regard to the wound on his left great toe. In general he seems to be doing excellent in this regard. Nonetheless he does have a wound with callous surrounding there appears to be some new epithelialization but in general the healing seems to be very slow according to the patient. He was seen in the ER at [facility] on 11/24/17 where he was prescribed Bactrim along with Keflex for infection. That has been completed at this  point. No fevers, chills, nausea, or vomiting noted at this time. 01/05/18 on evaluation today patient actually appears to be doing rather well in regard to the left great toe ulcer. He has been tolerating the dressing changes without complication. Fortunately there does not appear to be any evidence  of infection at this time. No fevers, chills, nausea, or vomiting noted at this time. I did review patient's x-ray which she did have in the interim since I last saw him. This was negative for any signs of acute osteomyelitis or bony injury. 01/19/18 upon evaluation today patient actually appears to be doing very well in regard to his toe ulcer. The show signs of some good epithelialization he does have some callous noted on the plantar aspect of the wound location but again this is minimal compared to previous. Overall I feel like he's making good progress. Fortunately there does not appear to be any evidence of infection at this time he's wearing the offloading shoe and from the standpoint of the overall appearance of the wound I would say he's wearing this on a regular basis. Fortunately his x-ray did come back negative for any evidence of a bone abnormality. Electronic Signature(s) Signed: 01/19/2018 1:14:28 PM By: Worthy Keeler PA-C Entered By: Worthy Keeler on 01/19/2018 10:48:32 Quale, Carin Hock ([id number]) -------------------------------------------------------------------------------- Physical Exam Details Patient Name: [name] Date of Service: 01/19/2018 9:30 AM Medical Record Number: [id number] Patient Account Number: [id number] Date of Birth/Sex: 1966/03/09 (52 y.o. M) Treating RN: Montey Hora Primary Care Provider: SYSTEM, PCP Other Clinician: Referring Provider: Referral, Self Treating Provider/Extender: STONE III, Yoshi Vicencio Weeks in Treatment: 3 Constitutional Well-nourished and well-hydrated in no acute distress. Respiratory normal breathing without  difficulty. Psychiatric this patient is able to make decisions and demonstrates good insight into disease process. Alert and Oriented x 3. pleasant and cooperative. Notes Patient's wound bed currently shows evidence of good granulation although he did have some Slough noted on the surface of the wound that did require sharp debridement today. Post debridement I did remove callous as will Slough from the surface and the wound appears to be doing significantly better which is great news. Overall very pleased in that regard. Electronic Signature(s) Signed: 01/19/2018 1:14:28 PM By: Worthy Keeler PA-C Entered By: Worthy Keeler on 01/19/2018 10:49:37 Echevarria, Carin Hock ([id number]) -------------------------------------------------------------------------------- Physician Orders Details Patient Name: [name] Date of Service: 01/19/2018 9:30 AM Medical Record Number: [id number] Patient Account Number: [id number] Date of Birth/Sex: 1965/09/27 (52 y.o. 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 o May Shower, gently pat wound dry prior to applying new dressing. Anesthetic (add to Medication List) Wound #1 Left,Plantar Toe Great o Topical Lidocaine 4% cream applied to wound bed prior to debridement (In Clinic Only). Primary Wound Dressing Wound #1 Left,Plantar Toe Great o Silver Collagen Secondary Dressing Wound #1 Left,Plantar Toe Great o Conform/Kerlix o  Drawtex Dressing Change Frequency Wound #1 Left,Plantar Toe Great o Change dressing every day. Follow-up Appointments Wound #1 Left,Plantar Toe Great o Return Appointment in 2 weeks. Off-Loading Wound #1 Left,Plantar Toe Great o Other: - Darco shoe with peg assist Electronic Signature(s) Signed: 01/19/2018 1:14:28 PM By: Irean Hong Beal, Flora ([id number]) Signed: 01/19/2018 5:34:16 PM By: Montey Hora Entered By: Montey Hora on 01/19/2018 10:12:33 Maston, Carin Hock ([id number]) -------------------------------------------------------------------------------- Problem List Details Patient Name: [name] Date of  Service: 01/19/2018 9:30 AM Medical Record Number: [id number] Patient Account Number: [id number] Date of Birth/Sex: 1966-02-19 (52 y.o. M) Treating RN: Montey Hora Primary Care Provider: SYSTEM, PCP Other Clinician: Referring Provider: Referral, Self Treating Provider/Extender: Melburn Hake, Arilla Hice Weeks in Treatment: 3 Active Problems ICD-10 Evaluated Encounter Code Description Active Date Today Diagnosis G90.09 Other idiopathic peripheral autonomic neuropathy 12/29/2017 No Yes L97.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 of other part of left foot with fat 12/29/2017 No Yes layer exposed Ribera (primary) hypertension 12/29/2017 No Yes G47.30 Sleep apnea, unspecified 12/29/2017 No Yes Inactive Problems Resolved Problems Electronic Signature(s) Signed: 01/19/2018 1:14:28 PM By: Worthy Keeler PA-C Entered By: Worthy Keeler on 01/19/2018 10:05:16 Simkin, Carin Hock ([id number]) -------------------------------------------------------------------------------- Progress Note Details Patient Name: [name] Date of Service: 01/19/2018 9:30 AM Medical Record Number: [id number] Patient Account Number: [id number] Date of Birth/Sex: May 06, 1965 (52 y.o. M) Treating RN: Montey Hora Primary Care Provider: SYSTEM, PCP Other  Clinician: Referring Provider: Referral, Self Treating Provider/Extender: Melburn Hake, Armarion Greek Weeks in Treatment: 3 Subjective Chief Complaint Information obtained from Patient Left 1st toe ulcer History of Present Illness (HPI) 12/29/17 on evaluation today patient presents for an injury due to a trauma on the left great toe plantar aspect. 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 We did review his notes and records as well and there appears to be no evidence of a formal diagnosis of diabetes. 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 At this point the patient has no pain in regard to the wound on his left great toe. In general he seems to be doing excellent in this regard. Nonetheless he does have a wound with callous surrounding there appears to be some new epithelialization but in general the healing seems to be very slow according to the patient. He was seen in the ER at [facility] on 11/24/17 where he was prescribed Bactrim along with Keflex for infection. That has been completed at this point. No fevers, chills, nausea, or vomiting noted at this time. 01/05/18 on evaluation today patient actually appears to be doing rather well in regard to the left great toe ulcer. He has been tolerating the dressing changes without complication. Fortunately there does not appear to be any evidence of infection at this time. No fevers, chills, nausea, or vomiting noted at this time. I did review patient's x-ray which she did have in the interim since I last saw him. This was negative for any signs of acute osteomyelitis or bony injury. 01/19/18 upon evaluation today patient actually appears to be doing very well in regard to his toe ulcer. The show signs of some good epithelialization he does have some callous noted on the  plantar aspect of the wound location but again this is minimal compared to previous. Overall I feel like he's making good progress. Fortunately there does not appear to be any evidence of infection at this time he's wearing the offloading shoe and from the standpoint of the overall appearance of the wound I would say he's wearing this on a regular basis. Fortunately his x-ray did come back negative for any evidence of a bone abnormality. Patient History Information obtained from Patient. Family History Cancer - Mother,Father, Diabetes - Mother, Hypertension - Father, No family history of Kidney Disease, Lung Disease, Seizures, Stroke, Thyroid  Problems, Tuberculosis. Social History Current every day smoker - 15 years, Marital Status - Single, Alcohol Use - Moderate, Drug Use - No History, Caffeine Use - Never. Medical And Surgical History Notes Endocrine Borderline Review of Systems (ROS) Constitutional Symptoms (General Health) Denies complaints or symptoms of Fever, Chills. Byron, STACEY MAURA ([id number]) Respiratory The patient has no complaints or symptoms. Cardiovascular The patient has no complaints or symptoms. Psychiatric The patient has no complaints or symptoms. Objective Constitutional Well-nourished and well-hydrated in no acute distress. Vitals Time Taken: 9:30 AM, Height: 74 in, Weight: 252 lbs, BMI: 32.4, Temperature: 97.5 F, Pulse: 51 bpm, Respiratory Rate: 18 breaths/min, Blood Pressure: 121/84 mmHg. Respiratory normal breathing without difficulty. Psychiatric this patient is able to make decisions and demonstrates good insight into disease process. Alert and Oriented x 3. pleasant and cooperative. General Notes: Patient's wound bed currently shows evidence of good granulation although he did have some Slough noted on the surface of the wound that did require sharp debridement today. Post debridement I did remove callous as will Slough from the surface and the  wound appears to be doing significantly better which is great news. Overall very pleased in that regard. Integumentary (Hair, Skin) Wound #1 status is Open. Original cause of wound was Gradually Appeared. The wound is located on the [employer]. The wound measures 0.5cm length x 0.8cm width x 0.1cm depth; 0.314cm^2 area and 0.031cm^3 volume. There is Fat Layer (Subcutaneous Tissue) Exposed exposed. There is undermining starting at 12:00 and ending at 12:00 with a maximum distance of 0.1cm. There is a medium amount of serous drainage noted. The wound margin is flat and intact. There is large (67-100%) pink, hyper - granulation within the wound bed. There is a small (1-33%) amount of necrotic tissue within the wound bed including Adherent Slough. The periwound skin appearance exhibited: Callus. The periwound skin appearance did not exhibit: Crepitus, Excoriation, Induration, Rash, Scarring, Dry/Scaly, Maceration, Atrophie Blanche, Cyanosis, Ecchymosis, Hemosiderin Staining, Mottled, Pallor, Rubor, Erythema. Assessment Active Problems ICD-10 Other idiopathic peripheral autonomic neuropathy Non-pressure chronic ulcer of other part of left foot with fat layer exposed Essential (primary) hypertension Melling, Tamir T. ([id number]) Sleep apnea, unspecified Procedures Wound #1 Pre-procedure diagnosis of Wound #1 is a Neuropathic Ulcer-Non Diabetic located on the Left,Plantar Toe Great . There was a Excisional Skin/Subcutaneous Tissue Debridement with a total area of 0.4 sq cm performed by STONE III, Brooklen Runquist E., PA-C. With the following instrument(s): Curette to remove Viable and Non-Viable tissue/material. Material removed includes Callus, Subcutaneous Tissue, and Slough after achieving pain control using Lidocaine 4% Topical Solution. No specimens were taken. A time out was conducted at 10:08, prior to the start of the procedure. A Minimum amount of bleeding was controlled with Pressure.  The procedure was tolerated well with a pain level of 0 throughout and a pain level of 0 following the procedure. Post Debridement Measurements: 0.6cm length x 0.9cm width x 0.2cm depth; 0.085cm^3 volume. Character of Wound/Ulcer Post Debridement is improved. Post procedure Diagnosis Wound #1: Same as Pre-Procedure Plan Wound Cleansing: Wound #1 Left,Plantar Toe Great: Clean wound with Normal Saline. May Shower, gently pat wound dry prior to applying new dressing. Anesthetic (add to Medication List): Wound #1 Left,Plantar Toe Great: Topical Lidocaine 4% cream applied to wound bed prior to debridement (In Clinic Only). Primary Wound Dressing: Wound #1 Left,Plantar Toe Great: Silver Collagen Secondary Dressing: Wound #1 Left,Plantar Toe Great: Conform/Kerlix Drawtex Dressing Change Frequency: Wound #1 Left,Plantar Toe Great: Change dressing every day.  Follow-up Appointments: Wound #1 Left,Plantar Toe Great: Return Appointment in 2 weeks. Off-Loading: Wound #1 Left,Plantar Toe Great: Other: - Darco shoe with peg assist At this point my recommendation is going to be that we go ahead and initiate the above wound care measures for the next week. The patient is in agreement with the plan. If anything changes or worsens meantime he will contact the office and let me know. Cornwall, SUSUMU HACKLER ([id number]) Please see above for specific wound care orders. We will see patient for re-evaluation in 1 week(s) here in the clinic. If anything worsens or changes patient will contact our office for additional recommendations. Electronic Signature(s) Signed: 01/19/2018 1:14:28 PM By: Worthy Keeler PA-C Entered By: Worthy Keeler on 01/19/2018 10:50:11 Mara, Carin Hock ([id number]) -------------------------------------------------------------------------------- ROS/PFSH Details Patient Name: [name] Date of Service: 01/19/2018 9:30 AM Medical Record Number: [id number] Patient Account  Number: [id number] Date of Birth/Sex: August 22, 1965 (52 y.o. M) Treating RN: Montey Hora Primary Care Provider: SYSTEM, PCP Other Clinician: Referring Provider: Referral, Self Treating Provider/Extender: STONE III, Tametha Banning Weeks in Treatment: 3 Information Obtained From Patient Wound History Do you currently have one or more open woundso Yes How many open wounds do you currently haveo 1 Approximately how long have you had your woundso 2 months How have you been treating your wound(s) until nowo neosporin Has your wound(s) ever healed and then re-openedo No Have you had any lab work done in the past montho Yes Who ordered the lab work doneo Dr. Ivar Bury Have you tested positive for an antibiotic resistant organism (MRSA, VRE)o No Have you tested positive for osteomyelitis (bone infection)o No Have you had any tests for circulation on your legso No Constitutional Symptoms (General Health) Complaints and Symptoms: Negative for: Fever; Chills Eyes Medical History: Positive for: Cataracts - Left Negative for: Glaucoma; Optic Neuritis Ear/Nose/Mouth/Throat Medical History: Negative for: Chronic sinus problems/congestion; Middle ear problems Hematologic/Lymphatic Medical History: Negative for: Anemia; Hemophilia; Human Immunodeficiency Virus; Sickle Cell Disease Respiratory Complaints and Symptoms: No Complaints or Symptoms Medical History: Positive for: Asthma; Chronic Obstructive Pulmonary Disease (COPD); Sleep Apnea - C-pap Negative for: Aspiration; Pneumothorax; Tuberculosis Cardiovascular Complaints and Symptoms: No Complaints or Symptoms Medical HistoryTAYSOM, GLYMPH ([id number]) Negative for: Angina; Arrhythmia; Congestive Heart Failure; Coronary Artery Disease; Deep Vein Thrombosis; Hypertension; Hypotension; Myocardial Infarction; Peripheral Arterial Disease; Peripheral Venous Disease; Phlebitis; Vasculitis Gastrointestinal Medical History: Negative for: Cirrhosis ;  Colitis; Crohnos; Hepatitis A; Hepatitis B; Hepatitis C Endocrine Medical History: Negative for: Type I Diabetes; Type II Diabetes Past Medical History Notes: Borderline Integumentary (Skin) Medical History: Negative for: History of Burn; History of pressure wounds Musculoskeletal Medical History: Negative for: Gout; Rheumatoid Arthritis; Osteoarthritis; Osteomyelitis Neurologic Medical History: Positive for: Neuropathy - Feet Negative for: Dementia; Quadriplegia; Paraplegia; Seizure Disorder Oncologic Medical History: Negative for: Received Chemotherapy; Received Radiation Psychiatric Complaints and Symptoms: No Complaints or Symptoms Medical History: Negative for: Anorexia/bulimia; Confinement Anxiety HBO Extended History Items Eyes: Cataracts Immunizations Pneumococcal Vaccine: Received Pneumococcal Vaccination: No Implantable Devices Family and Social History Cancer: Yes - Mother,Father; Diabetes: Yes - Mother; Hypertension: Yes - Father; Kidney Disease: No; Lung Disease: No; Seizures: No; Stroke: No; Thyroid Problems: No; Tuberculosis: No; Current every day smoker - 15 years; Marital Status - Amer, Knowledge T. ([id number]) Single; Alcohol Use: Moderate; Drug Use: No History; Caffeine Use: Never; Advanced Directives: No; Patient does not want information on Advanced Directives; Do not resuscitate: No; Living Will: No; Medical Power of Attorney: No Physician Affirmation I have reviewed  and agree with the above information. Electronic Signature(s) Signed: 01/19/2018 1:14:28 PM By: Worthy Keeler PA-C Signed: 01/19/2018 5:34:16 PM By: Montey Hora Entered By: Worthy Keeler on 01/19/2018 10:49:00 Judy, Carin Hock ([id number]) -------------------------------------------------------------------------------- SuperBill Details Patient Name: [name] Date of Service: 01/19/2018 Medical Record Number: [id number] Patient Account Number: [id number] Date of  Birth/Sex: [birth year] (52 y.o. 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 I10 Essential (primary) hypertension G47.30 Sleep apnea, unspecified Facility Procedures CPT4 Code: [id number] Description: 10258 - DEB SUBQ TISSUE 20 SQ CM/< ICD-10 Diagnosis Description L97.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 of other part of left foot with fat Modifier: layer exposed Quantity: 1 Physician Procedures CPT4 Code: [id number] Description: 11042 - WC PHYS SUBQ TISS 20 SQ CM ICD-10 Diagnosis Description L97.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 of other part of left foot with fat Modifier: layer exposed Quantity: 1 Electronic Signature(s) Signed: 01/19/2018 1:14:28 PM By: Worthy Keeler PA-C Entered By: Worthy Keeler on 01/19/2018 10:50:19

## 2018-02-02 ENCOUNTER — Encounter: Payer: Medicaid Other | Admitting: Physician Assistant

## 2018-02-02 DIAGNOSIS — E11621 Type 2 diabetes mellitus with foot ulcer: Secondary | ICD-10-CM | POA: Diagnosis not present

## 2018-02-04 NOTE — Progress Notes (Signed)
KAIZEN, IBSEN ([id number]) Visit Report for 02/02/2018 Arrival Information Details Patient Name: [name], [name] Date of Service: 02/02/2018 9:30 AM Medical Record Number: [id number] Patient Account Number: [id number] Date of Birth/Sex: 23-Aug-1965 (52 y.o. M) Treating RN: Secundino Ginger Primary Care Mohammedali Bedoy: SYSTEM, PCP Other Clinician: Referring Llana Deshazo: Referral, Self Treating Jennifer Holland/Extender: STONE III, HOYT Weeks in Treatment: 5 Visit Information History Since Last Visit Added or deleted any medications: No Patient Arrived: Ambulatory Any new allergies or adverse reactions: No Arrival Time: 09:39 Had a fall or experienced change in No Accompanied By: self activities of daily living that may affect Transfer Assistance: None risk of falls: Patient Identification Verified: Yes Signs or symptoms of abuse/neglect since last visito No Secondary Verification Process Yes Hospitalized since last visit: No Completed: Implantable device outside of the clinic excluding No Patient Has Alerts: Yes cellular tissue based products placed in the center Patient Alerts: Borderline since last visit: Diabetic Has Dressing in Place as Prescribed: Yes Pain Present Now: No Electronic Signature(s) Signed: 02/02/2018 4:11:38 PM By: Secundino Ginger Entered By: Secundino Ginger on 02/02/2018 09:39:34 Carsey, Carin Hock ([id number]) -------------------------------------------------------------------------------- Encounter Discharge Information Details Patient Name: [name] Date of Service: 02/02/2018 9:30 AM Medical Record Number: [id number] Patient Account Number: [id number] Date of Birth/Sex: November 16, 1965 (52 y.o. M) Treating RN: Montey Hora Primary Care Gurshaan Matsuoka: SYSTEM, PCP Other Clinician: Referring Samael Blades: Referral, Self Treating Jeromy Borcherding/Extender: STONE III, HOYT Weeks in Treatment: 5 Encounter Discharge Information Items Post Procedure Vitals Discharge Condition: Stable Temperature  (F): 97.7 Ambulatory Status: Ambulatory Pulse (bpm): 66 Discharge Destination: Home Respiratory Rate (breaths/min): 18 Transportation: Private Auto Blood Pressure (mmHg): 152/97 Accompanied By: self Schedule Follow-up Appointment: Yes Clinical Summary of Care: Electronic Signature(s) Signed: 02/02/2018 5:11:36 PM By: Montey Hora Entered By: Montey Hora on 02/02/2018 10:19:31 Hockenberry, Carin Hock ([id number]) -------------------------------------------------------------------------------- Lower Extremity Assessment Details Patient Name: [name] Date of Service: 02/02/2018 9:30 AM Medical Record Number: [id number] Patient Account Number: [id number] Date of Birth/Sex: December 21, 1965 (52 y.o. M) Treating RN: Secundino Ginger Primary Care Caron Ode: SYSTEM, PCP Other Clinician: Referring Spyros Winch: Referral, Self Treating Dioselina Brumbaugh/Extender: STONE III, HOYT Weeks in Treatment: 5 Vascular Assessment Claudication: Claudication Assessment [Left:None] Pulses: Dorsalis Pedis Palpable: [Left:Yes] Posterior Tibial Extremity colors, hair growth, and conditions: Extremity Color: [Left:Normal] Hair Growth on Extremity: [Left:Yes] Temperature of Extremity: [Left:Warm] Capillary Refill: [Left:< 3 seconds] Toe Nail Assessment Left: Right: Thick: No Discolored: No Deformed: No Improper Length and Hygiene: No Electronic Signature(s) Signed: 02/02/2018 4:11:38 PM By: Secundino Ginger Entered By: Secundino Ginger on 02/02/2018 09:45:57 Saiki, Carin Hock ([id number]) -------------------------------------------------------------------------------- Multi Wound Chart Details Patient Name: [name] Date of Service: 02/02/2018 9:30 AM Medical Record Number: [id number] Patient Account Number: [id number] Date of Birth/Sex: 02/28/1966 (52 y.o. 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] [N/A:N/A] Comorbid History: [1:Cataracts, Asthma, Chronic Obstructive Pulmonary Disease (COPD), Sleep Apnea, Neuropathy] [N/A:N/A] Date Acquired: [1:10/12/2017] [N/A:N/A] Weeks of Treatment: [1:5] [N/A:N/A] Wound Status: [1:Open] [N/A:N/A] Measurements L x W x D [1:0.5x0.5x0.1] [N/A:N/A] (cm) Area (cm) : [1:0.196] [N/A:N/A] Volume (cm) : [1:0.02] [N/A:N/A] % Reduction in Area: [1:84.40%] [N/A:N/A] % Reduction in Volume: [1:92.00%] [N/A:N/A] Classification: [1:Full Thickness Without Exposed  Support Structures] [N/A:N/A] Exudate Amount: [1:Small] [N/A:N/A] Exudate Type: [1:Serous] [N/A:N/A] Exudate Color: [1:amber] [N/A:N/A] Wound Margin: [1:Flat and Intact] [N/A:N/A] Granulation Amount: [1:None Present (0%)] [N/A:N/A] Necrotic Amount: [1:None Present (0%)] [N/A:N/A] Exposed Structures: [1:Fat Layer (Subcutaneous Tissue) Exposed: Yes Fascia: No Tendon: No Muscle: No Joint: No Bone: No] [N/A:N/A] Epithelialization: [1:None] [N/A:N/A] Periwound Skin Texture: [1:Callus: Yes Excoriation: No Induration: No] [N/A:N/A] Crepitus: No Rash: No Scarring: No Periwound Skin Moisture: Maceration: Yes N/A N/A Dry/Scaly: No Periwound Skin Color: Atrophie Blanche: No N/A N/A Cyanosis: No Ecchymosis: No Erythema: No Hemosiderin Staining: No Mottled: No Pallor: No Rubor: No Tenderness on Palpation: No N/A N/A Wound Preparation: Ulcer Cleansing: N/A N/A Rinsed/Irrigated with Saline Topical Anesthetic Applied: Other: lidocaine 4% Treatment Notes Electronic Signature(s) Signed: 02/02/2018 5:11:36  PM By: Montey Hora Entered By: Montey Hora on 02/02/2018 10:13:34 Wedeking, Carin Hock ([id number]) -------------------------------------------------------------------------------- [place] Details Patient Name: [name] Date of Service: 02/02/2018 9:30 AM Medical Record Number: [id number] Patient Account Number: [id number] Date of Birth/Sex: 11-12-1965 (52 y.o. M) Treating RN: Montey Hora Primary Care Doaa Kendzierski: SYSTEM, PCP Other Clinician: Referring Naftoli Penny: Referral, Self Treating Greydon Betke/Extender: STONE III, HOYT Weeks in Treatment: 5 Active Inactive ` Abuse / Safety / Falls / Self Care Management Nursing Diagnoses: History of Falls Goals: Patient will remain injury free related to falls Date Initiated: 12/29/2017 Target Resolution Date: 03/09/2018 Goal Status: Active Interventions: Assess fall risk on admission and as needed Notes: ` Orientation to the Wound Care Program Nursing Diagnoses: Knowledge deficit related to the wound healing center program Goals: Patient/caregiver will verbalize understanding of the [place] Program Date Initiated: 12/29/2017 Target Resolution Date: 03/09/2018 Goal Status: Active Interventions: Provide education on orientation to the wound center Notes: ` Wound/Skin Impairment Nursing Diagnoses: Impaired tissue integrity Goals: Ulcer/skin breakdown will heal within 14 weeks Date Initiated: 12/29/2017 Target Resolution Date: 03/09/2018 Goal Status: Active Interventions: JOHNROBERT, FOTI ([id number]) Assess patient/caregiver ability to obtain necessary supplies Assess patient/caregiver ability to perform ulcer/skin care regimen upon admission and as needed Assess ulceration(s) every visit Notes: Electronic Signature(s) Signed: 02/02/2018 5:11:36 PM By: Montey Hora Entered By: Montey Hora on 02/02/2018 10:13:22 Zwiebel, Carin Hock  ([id number]) -------------------------------------------------------------------------------- Pain Assessment Details Patient Name: [name] Date of Service: 02/02/2018 9:30 AM Medical Record Number: [id number] Patient Account Number: [id number] Date of Birth/Sex: [birth year] (52 y.o. M) Treating RN: Secundino Ginger Primary Care Cal Gindlesperger: SYSTEM, PCP Other Clinician: Referring Sonyia Muro: Referral, Self Treating Nyomi Howser/Extender: STONE III, HOYT Weeks in Treatment: 5 Active Problems Location of Pain Severity and Description of Pain Patient Has Paino No Site Locations Pain Management and Medication Current Pain Management: Goals for Pain Management pt denies any pain at this time. Electronic Signature(s) Signed: 02/02/2018 4:11:38 PM By: Secundino Ginger Entered By: Secundino Ginger on 02/02/2018 09:39:52 Braman, Carin Hock ([id number]) -------------------------------------------------------------------------------- Patient/Caregiver Education Details Patient Name: [name] Date of Service: 02/02/2018 9:30 AM Medical Record Number: [id number] Patient Account Number: [id number] Date of Birth/Gender: August 19, 1965 (52 y.o. M) Treating RN: Montey Hora Primary Care Physician: SYSTEM, PCP Other Clinician: Referring Physician: Referral, Self Treating Physician/Extender: Melburn Hake, HOYT Weeks in Treatment: 5 Education Assessment Education Provided To: Patient Education Topics Provided Wound/Skin Impairment: Handouts: Other: wound care and offloading Methods: Demonstration, Explain/Verbal Responses: State content correctly Electronic Signature(s) Signed: 02/02/2018 5:11:36 PM By: Montey Hora Entered By: Montey Hora on 02/02/2018 10:18:15 Pellman, Carin Hock ([id number]) -------------------------------------------------------------------------------- Wound Assessment Details Patient Name: [name] Date of Service: 02/02/2018 9:30 AM Medical Record Number:  [id number]  Patient Account Number: [id number] Date of Birth/Sex: 1965-03-31 (52 y.o. M) Treating RN: Secundino Ginger Primary Care Luna Audia: SYSTEM, PCP Other Clinician: Referring Fusako Tanabe: Referral, Self Treating Jacarius Handel/Extender: STONE III, HOYT Weeks in Treatment: 5 Wound Status Wound Number: 1 Primary Neuropathic Ulcer-Non Diabetic Etiology: Wound Location: Left Toe Great - Plantar Wound Open Wounding Event: Gradually Appeared Status: Date Acquired: 10/12/2017 Comorbid Cataracts, Asthma, Chronic Obstructive Weeks Of Treatment: 5 History: Pulmonary Disease (COPD), Sleep Apnea, Clustered Wound: No Neuropathy Photos Photo Uploaded By: Secundino Ginger on 02/02/2018 10:29:29 Wound Measurements Length: (cm) 0.5 Width: (cm) 0.5 Depth: (cm) 0.1 Area: (cm) 0.196 Volume: (cm) 0.02 % Reduction in Area: 84.4% % Reduction in Volume: 92% Epithelialization: None Tunneling: No Undermining: No Wound Description Full Thickness Without Exposed Support Foul Odo Classification: Structures Slough/F Wound Margin: Flat and Intact Exudate Small Amount: Exudate Type: Serous Exudate Color: amber r After Cleansing: No ibrino Yes Wound Bed Granulation Amount: None Present (0%) Exposed Structure Necrotic Amount: None Present (0%) Fascia Exposed: No Fat Layer (Subcutaneous Tissue) Exposed: Yes Tendon Exposed: No Muscle Exposed: No Joint Exposed: No Bone Exposed: No Murrillo, Ollin T. ([id number]) Periwound Skin Texture Texture Color No Abnormalities Noted: No No Abnormalities Noted: No Callus: Yes Atrophie Blanche: No Crepitus: No Cyanosis: No Excoriation: No Ecchymosis: No Induration: No Erythema: No Rash: No Hemosiderin Staining: No Scarring: No Mottled: No Pallor: No Moisture Rubor: No No Abnormalities Noted: No Dry / Scaly: No Maceration: Yes Wound Preparation Ulcer Cleansing: Rinsed/Irrigated with Saline Topical Anesthetic Applied: Other: lidocaine 4%, Treatment  Notes Wound #1 (Left, Plantar Toe Great) Notes prisma, gauze/conform with darco with peg assist and felt for offloading Electronic Signature(s) Signed: 02/02/2018 4:11:38 PM By: Secundino Ginger Entered By: Secundino Ginger on 02/02/2018 09:45:11 Byrum, Carin Hock ([id number]) -------------------------------------------------------------------------------- Vitals Details Patient Name: [name] Date of Service: 02/02/2018 9:30 AM Medical Record Number: [id number] Patient Account Number: [id number] Date of Birth/Sex: 1966-02-12 (52 y.o. M) Treating RN: Secundino Ginger Primary Care Manha Amato: SYSTEM, PCP Other Clinician: Referring Makhi Muzquiz: Referral, Self Treating Hunt Zajicek/Extender: STONE III, HOYT Weeks in Treatment: 5 Vital Signs Time Taken: 09:40 Temperature (F): 97.7 Height (in): 74 Pulse (bpm): 66 Weight (lbs): 252 Respiratory Rate (breaths/min): 16 Body Mass Index (BMI): 32.4 Blood Pressure (mmHg): 152/97 Reference Range: 80 - 120 mg / dl Electronic Signature(s) Signed: 02/02/2018 4:11:38 PM By: Secundino Ginger Entered BySecundino Ginger on 02/02/2018 09:40:24

## 2018-02-04 NOTE — Progress Notes (Signed)
DENZELL, COLASANTI ([id number]) Visit Report for 02/02/2018 Chief Complaint Document Details Patient Name: [name], [name] Date of Service: 02/02/2018 9:30 AM Medical Record Number: [id number] Patient Account Number: [id number] Date of Birth/Sex: 14-Dec-1965 (52 y.o. M) Treating RN: Montey Hora Primary Care Provider: SYSTEM, PCP Other Clinician: Referring Provider: Referral, Self Treating Provider/Extender: Melburn Hake, Levia Waltermire Weeks in Treatment: 5 Information Obtained from: Patient Chief Complaint Left 1st toe ulcer Electronic Signature(s) Signed: 02/02/2018 5:33:52 PM By: Worthy Keeler PA-C Entered By: Worthy Keeler on 02/02/2018 09:47:41 Feeback, Carin Hock ([id number]) -------------------------------------------------------------------------------- Debridement Details Patient Name: [name] Date of Service: 02/02/2018 9:30 AM Medical Record Number: [id number] Patient Account Number: [id number] Date of Birth/Sex: Aug 08, 1965 (52 y.o. M) Treating RN: Montey Hora Primary Care Provider: SYSTEM, PCP Other Clinician: Referring Provider: Referral, Self Treating Provider/Extender: STONE III, Uchechi Denison Weeks in Treatment: 5 Debridement Performed for Wound #1 Left,Plantar Toe Great Assessment: Performed By: Physician STONE III, Ra Pfiester E., PA-C Debridement Type: Debridement Level of Consciousness (Pre- Awake and Alert procedure): Pre-procedure Verification/Time Yes - 10:11 Out Taken: Start Time: 10:11 Pain Control: Lidocaine 4% Topical Solution Total Area Debrided (L x W): 0.5 (cm) x 0.5 (cm) = 0.25 (cm) Tissue and other material Viable, Non-Viable, Callus, Slough, Subcutaneous, Biofilm, Slough debrided: Level: Skin/Subcutaneous Tissue Debridement Description: Excisional Instrument: Curette Bleeding: Minimum Hemostasis Achieved: Pressure End Time: 10:15 Procedural Pain: 0 Post Procedural Pain: 0 Response to Treatment: Procedure was tolerated well Level of  Consciousness Awake and Alert (Post-procedure): Post Debridement Measurements of Total Wound Length: (cm) 0.5 Width: (cm) 0.5 Depth: (cm) 0.3 Volume: (cm) 0.059 Character of Wound/Ulcer Post Debridement: Improved Post Procedure Diagnosis Same as Pre-procedure Electronic Signature(s) Signed: 02/02/2018 5:11:36 PM By: Montey Hora Signed: 02/02/2018 5:33:52 PM By: Worthy Keeler PA-C Entered By: Montey Hora on 02/02/2018 10:16:12 Pressman, Carin Hock ([id number]) -------------------------------------------------------------------------------- HPI Details Patient Name: [name] Date of Service: 02/02/2018 9:30 AM Medical Record Number: [id number] Patient Account Number: [id number] Date of Birth/Sex: April 15, 1965 (52 y.o. M) Treating RN: Montey Hora Primary Care Provider: SYSTEM, PCP Other Clinician: Referring Provider: Referral, Self Treating Provider/Extender: STONE III, Fenna Semel Weeks in Treatment: 5 History of Present Illness HPI Description: 12/29/17 on evaluation today patient presents for an injury due to a trauma on the left great toe plantar aspect. 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 We did review his notes and records as well and there appears to be no evidence of a formal diagnosis of diabetes. 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 At this point the patient has no pain in regard to the wound on his left great toe. In general he seems to be doing excellent in this regard. Nonetheless he does have a wound with callous surrounding there appears to be some new epithelialization but in general the healing seems to be very slow according to the patient. He was seen in the ER at [facility] on 11/24/17 where he was prescribed Bactrim along with Keflex for infection. That has  been completed at this point. No fevers, chills, nausea, or vomiting noted at this time. 01/05/18 on evaluation today patient actually appears to be doing rather well in regard to the left great toe ulcer. He has been tolerating the dressing changes without complication. Fortunately there does not appear to be any  evidence of infection at this time. No fevers, chills, nausea, or vomiting noted at this time. I did review patient's x-ray which she did have in the interim since I last saw him. This was negative for any signs of acute osteomyelitis or bony injury. 01/19/18 upon evaluation today patient actually appears to be doing very well in regard to his toe ulcer. The show signs of some good epithelialization he does have some callous noted on the plantar aspect of the wound location but again this is minimal compared to previous. Overall I feel like he's making good progress. Fortunately there does not appear to be any evidence of infection at this time he's wearing the offloading shoe and from the standpoint of the overall appearance of the wound I would say he's wearing this on a regular basis. Fortunately his x-ray did come back negative for any evidence of a bone abnormality. 02/02/18 on evaluation today patient actually appears to be doing better in regard to his foot ulcer. He continues to show signs of improvement. I do believe that our offloading measures have been for the most part successful. Overall I'm very pleased in this regard. No fevers, chills, nausea, or vomiting noted at this time. Electronic Signature(s) Signed: 02/02/2018 5:33:52 PM By: Worthy Keeler PA-C Entered By: Worthy Keeler on 02/02/2018 10:23:53 Vivanco, Carin Hock ([id number]) -------------------------------------------------------------------------------- Physical Exam Details Patient Name: [name] Date of Service: 02/02/2018 9:30 AM Medical Record Number: [id number] Patient Account Number:  [id number] Date of Birth/Sex: [birth year] (52 y.o. M) Treating RN: Montey Hora Primary Care Provider: SYSTEM, PCP Other Clinician: Referring Provider: Referral, Self Treating Provider/Extender: STONE III, Jonne Rote Weeks in Treatment: 5 Constitutional Well-nourished and well-hydrated in no acute distress. Respiratory normal breathing without difficulty. Psychiatric this patient is able to make decisions and demonstrates good insight into disease process. Alert and Oriented x 3. pleasant and cooperative. Notes Patient's wound bed currently shows evidence of good granulation with some Slough noted on the surface. He has skin growing around the edge and though there is some callous is not as bad as what it was previous. Post debridement the wound bed appears to be doing much better which is excellent news. Electronic Signature(s) Signed: 02/02/2018 5:33:52 PM By: Worthy Keeler PA-C Entered By: Worthy Keeler on 02/02/2018 10:24:34 Etsitty, Carin Hock ([id number]) -------------------------------------------------------------------------------- Physician Orders Details Patient Name: [name] Date of Service: 02/02/2018 9:30 AM Medical Record Number: [id number] Patient Account Number: [id number] Date of Birth/Sex: 01-12-1966 (52 y.o. M) Treating RN: Montey Hora Primary Care Provider: SYSTEM, PCP Other Clinician: Referring Provider: Referral, Self Treating Provider/Extender: STONE III, Kyliegh Jester Weeks in Treatment: 5 Verbal / Phone Orders: No Diagnosis Coding ICD-10 Coding Code Description G90.09 Other idiopathic peripheral autonomic neuropathy L97.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 of other part of left foot with fat layer exposed I10 Essential (primary) hypertension G47.30 Sleep apnea, unspecified Wound Cleansing Wound #1 Left,Plantar Toe Great o Clean wound with Normal Saline. o May Shower, gently pat wound dry prior to applying new dressing. Anesthetic (add to Medication  List) Wound #1 Left,Plantar Toe Great o Topical Lidocaine 4% cream applied to wound bed prior to debridement (In Clinic Only). Primary Wound Dressing Wound #1 Left,Plantar Toe Great o Silver Collagen Secondary Dressing Wound #1 Left,Plantar Toe Great o Conform/Kerlix Dressing Change Frequency Wound #1 Left,Plantar Toe Great o Change dressing every day. Follow-up Appointments Wound #1 Left,Plantar Toe Great o Return Appointment in 2 weeks. Off-Loading Wound #1 Left,Plantar Toe Great o Other: - Darco  shoe with peg assist Electronic Signature(s) Signed: 02/02/2018 5:11:36 PM By: Montey Hora Signed: 02/02/2018 5:33:52 PM By: Irean Hong Freeland, Texanna ([id number]) Entered By: Montey Hora on 02/02/2018 10:17:52 Edmiston, Carin Hock ([id number]) -------------------------------------------------------------------------------- Problem List Details Patient Name: [name] Date of Service: 02/02/2018 9:30 AM Medical Record Number: [id number] Patient Account Number: [id number] Date of Birth/Sex: 04/05/1965 (52 y.o. M) Treating RN: Montey Hora Primary Care Provider: SYSTEM, PCP Other Clinician: Referring Provider: Referral, Self Treating Provider/Extender: STONE III, Nomi Rudnicki Weeks in Treatment: 5 Active Problems ICD-10 Evaluated Encounter Code Description Active Date Today Diagnosis G90.09 Other idiopathic peripheral autonomic neuropathy 12/29/2017 No Yes L97.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 of other part of left foot with fat 12/29/2017 No Yes layer exposed Hillman (primary) hypertension 12/29/2017 No Yes G47.30 Sleep apnea, unspecified 12/29/2017 No Yes Inactive Problems Resolved Problems Electronic Signature(s) Signed: 02/02/2018 5:33:52 PM By: Worthy Keeler PA-C Entered By: Worthy Keeler on 02/02/2018 09:47:36 Watkinson, Carin Hock ([id number]) -------------------------------------------------------------------------------- Progress  Note Details Patient Name: [name] Date of Service: 02/02/2018 9:30 AM Medical Record Number: [id number] Patient Account Number: [id number] Date of Birth/Sex: August 22, 1965 (52 y.o. M) Treating RN: Montey Hora Primary Care Provider: SYSTEM, PCP Other Clinician: Referring Provider: Referral, Self Treating Provider/Extender: STONE III, Emylee Decelle Weeks in Treatment: 5 Subjective Chief Complaint Information obtained from Patient Left 1st toe ulcer History of Present Illness (HPI) 12/29/17 on evaluation today patient presents for an injury due to a trauma on the left great toe plantar aspect. 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 We did review his notes and records as well and there appears to be no evidence of a formal diagnosis of diabetes. 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 At this point the patient has no pain in regard to the wound on his left great toe. In general he seems to be doing excellent in this regard. Nonetheless he does have a wound with callous surrounding there appears to be some new epithelialization but in general the healing seems to be very slow according to the patient. He was seen in the ER at [facility] on 11/24/17 where he was prescribed Bactrim along with Keflex for infection. That has been completed at this point. No fevers, chills, nausea, or vomiting noted at this time. 01/05/18 on evaluation today patient actually appears to be doing rather well in regard to the left great toe ulcer. He has been tolerating the dressing changes without complication. Fortunately there does not appear to be any evidence of infection at this time. No fevers, chills, nausea, or vomiting noted at this time. I did review patient's x-ray which she did have in the interim since I last saw  him. This was negative for any signs of acute osteomyelitis or bony injury. 01/19/18 upon evaluation today patient actually appears to be doing very well in regard to his toe ulcer. The show signs of some good epithelialization he does have some callous noted on the plantar aspect of the wound location but again this is minimal compared to previous. Overall I feel like he's making good progress. Fortunately there does not appear to be any evidence of infection at this time he's wearing the offloading shoe and from the standpoint of the overall appearance of the wound I would say he's  wearing this on a regular basis. Fortunately his x-ray did come back negative for any evidence of a bone abnormality. 02/02/18 on evaluation today patient actually appears to be doing better in regard to his foot ulcer. He continues to show signs of improvement. I do believe that our offloading measures have been for the most part successful. Overall I'm very pleased in this regard. No fevers, chills, nausea, or vomiting noted at this time. Patient History Information obtained from Patient. Family History Cancer - Mother,Father, Diabetes - Mother, Hypertension - Father, No family history of Kidney Disease, Lung Disease, Seizures, Stroke, Thyroid Problems, Tuberculosis. Social History Current every day smoker - 15 years, Marital Status - Single, Alcohol Use - Moderate, Drug Use - No History, Caffeine Use - Never. Medical And Surgical History Notes Endocrine Borderline Waynick, SHUAN STATZER. ([id number]) Review of Systems (ROS) Constitutional Symptoms (General Health) Denies complaints or symptoms of Fever, Chills. Respiratory The patient has no complaints or symptoms. Cardiovascular The patient has no complaints or symptoms. Psychiatric The patient has no complaints or symptoms. Objective Constitutional Well-nourished and well-hydrated in no acute distress. Vitals Time Taken: 9:40 AM, Height: 74 in, Weight:  252 lbs, BMI: 32.4, Temperature: 97.7 F, Pulse: 66 bpm, Respiratory Rate: 16 breaths/min, Blood Pressure: 152/97 mmHg. Respiratory normal breathing without difficulty. Psychiatric this patient is able to make decisions and demonstrates good insight into disease process. Alert and Oriented x 3. pleasant and cooperative. General Notes: Patient's wound bed currently shows evidence of good granulation with some Slough noted on the surface. He has skin growing around the edge and though there is some callous is not as bad as what it was previous. Post debridement the wound bed appears to be doing much better which is excellent news. Integumentary (Hair, Skin) Wound #1 status is Open. Original cause of wound was Gradually Appeared. The wound is located on the [employer]. The wound measures 0.5cm length x 0.5cm width x 0.1cm depth; 0.196cm^2 area and 0.02cm^3 volume. There is Fat Layer (Subcutaneous Tissue) Exposed exposed. There is no tunneling or undermining noted. There is a small amount of serous drainage noted. The wound margin is flat and intact. There is no granulation within the wound bed. There is no necrotic tissue within the wound bed. The periwound skin appearance exhibited: Callus, Maceration. The periwound skin appearance did not exhibit: Crepitus, Excoriation, Induration, Rash, Scarring, Dry/Scaly, Atrophie Blanche, Cyanosis, Ecchymosis, Hemosiderin Staining, Mottled, Pallor, Rubor, Erythema. Assessment Active Problems ICD-10 Other idiopathic peripheral autonomic neuropathy Utke, Dearl T. ([id number]) Non-pressure chronic ulcer of other part of left foot with fat layer exposed Essential (primary) hypertension Sleep apnea, unspecified Procedures Wound #1 Pre-procedure diagnosis of Wound #1 is a Neuropathic Ulcer-Non Diabetic located on the Left,Plantar Toe Great . There was a Excisional Skin/Subcutaneous Tissue Debridement with a total area of 0.25 sq cm performed  by STONE III, Jinnifer Montejano E., PA-C. With the following instrument(s): Curette to remove Viable and Non-Viable tissue/material. Material removed includes Callus, Subcutaneous Tissue, Slough, and Biofilm after achieving pain control using Lidocaine 4% Topical Solution. No specimens were taken. A time out was conducted at 10:11, prior to the start of the procedure. A Minimum amount of bleeding was controlled with Pressure. The procedure was tolerated well with a pain level of 0 throughout and a pain level of 0 following the procedure. Post Debridement Measurements: 0.5cm length x 0.5cm width x 0.3cm depth; 0.059cm^3 volume. Character of Wound/Ulcer Post Debridement is improved. Post procedure Diagnosis Wound #1: Same as Pre-Procedure Plan  Wound Cleansing: Wound #1 Left,Plantar Toe Great: Clean wound with Normal Saline. May Shower, gently pat wound dry prior to applying new dressing. Anesthetic (add to Medication List): Wound #1 Left,Plantar Toe Great: Topical Lidocaine 4% cream applied to wound bed prior to debridement (In Clinic Only). Primary Wound Dressing: Wound #1 Left,Plantar Toe Great: Silver Collagen Secondary Dressing: Wound #1 Left,Plantar Toe Great: Conform/Kerlix Dressing Change Frequency: Wound #1 Left,Plantar Toe Great: Change dressing every day. Follow-up Appointments: Wound #1 Left,Plantar Toe Great: Return Appointment in 2 weeks. Off-Loading: Wound #1 Left,Plantar Toe Great: Other: - Darco shoe with peg assist I'm gonna suggest that we continue with the above wound care measures for the next week. Patient is in agreement the plan. If anything changes or worsens meantime he will contact the office and let me know. Otherwise I'm hopeful that with continued padding underneath his toe will be able to continued offloading this area and allowing to heal appropriately. Jago, RUBY DILONE ([id number]) Please see above for specific wound care orders. We will see patient for  re-evaluation in 2 week(s) here in the clinic. If anything worsens or changes patient will contact our office for additional recommendations.We Electronic Signature(s) Signed: 02/02/2018 5:33:52 PM By: Worthy Keeler PA-C Entered By: Worthy Keeler on 02/02/2018 10:24:56 Bora, Carin Hock ([id number]) -------------------------------------------------------------------------------- ROS/PFSH Details Patient Name: [name] Date of Service: 02/02/2018 9:30 AM Medical Record Number: [id number] Patient Account Number: [id number] Date of Birth/Sex: 1965-06-23 (52 y.o. M) Treating RN: Montey Hora Primary Care Provider: SYSTEM, PCP Other Clinician: Referring Provider: Referral, Self Treating Provider/Extender: STONE III, Kayleeann Huxford Weeks in Treatment: 5 Information Obtained From Patient Wound History Do you currently have one or more open woundso Yes How many open wounds do you currently haveo 1 Approximately how long have you had your woundso 2 months How have you been treating your wound(s) until nowo neosporin Has your wound(s) ever healed and then re-openedo No Have you had any lab work done in the past montho Yes Who ordered the lab work doneo Dr. Ivar Bury Have you tested positive for an antibiotic resistant organism (MRSA, VRE)o No Have you tested positive for osteomyelitis (bone infection)o No Have you had any tests for circulation on your legso No Constitutional Symptoms (General Health) Complaints and Symptoms: Negative for: Fever; Chills Eyes Medical History: Positive for: Cataracts - Left Negative for: Glaucoma; Optic Neuritis Ear/Nose/Mouth/Throat Medical History: Negative for: Chronic sinus problems/congestion; Middle ear problems Hematologic/Lymphatic Medical History: Negative for: Anemia; Hemophilia; Human Immunodeficiency Virus; Sickle Cell Disease Respiratory Complaints and Symptoms: No Complaints or Symptoms Medical History: Positive for: Asthma; Chronic  Obstructive Pulmonary Disease (COPD); Sleep Apnea - C-pap Negative for: Aspiration; Pneumothorax; Tuberculosis Cardiovascular Complaints and Symptoms: No Complaints or Symptoms Medical HistoryJOHNWESLEY, LEDERMAN ([id number]) Negative for: Angina; Arrhythmia; Congestive Heart Failure; Coronary Artery Disease; Deep Vein Thrombosis; Hypertension; Hypotension; Myocardial Infarction; Peripheral Arterial Disease; Peripheral Venous Disease; Phlebitis; Vasculitis Gastrointestinal Medical History: Negative for: Cirrhosis ; Colitis; Crohnos; Hepatitis A; Hepatitis B; Hepatitis C Endocrine Medical History: Negative for: Type I Diabetes; Type II Diabetes Past Medical History Notes: Borderline Integumentary (Skin) Medical History: Negative for: History of Burn; History of pressure wounds Musculoskeletal Medical History: Negative for: Gout; Rheumatoid Arthritis; Osteoarthritis; Osteomyelitis Neurologic Medical History: Positive for: Neuropathy - Feet Negative for: Dementia; Quadriplegia; Paraplegia; Seizure Disorder Oncologic Medical History: Negative for: Received Chemotherapy; Received Radiation Psychiatric Complaints and Symptoms: No Complaints or Symptoms Medical History: Negative for: Anorexia/bulimia; Confinement Anxiety HBO Extended History Items Eyes: Cataracts Immunizations Pneumococcal Vaccine:  Received Pneumococcal Vaccination: No Implantable Devices Family and Social History Cancer: Yes - Mother,Father; Diabetes: Yes - Mother; Hypertension: Yes - Father; Kidney Disease: No; Lung Disease: No; Seizures: No; Stroke: No; Thyroid Problems: No; Tuberculosis: No; Current every day smoker - 15 years; Marital Status - Kruczek, Layden T. ([id number]) Single; Alcohol Use: Moderate; Drug Use: No History; Caffeine Use: Never; Advanced Directives: No; Patient does not want information on Advanced Directives; Do not resuscitate: No; Living Will: No; Medical Power of Attorney:  No Physician Affirmation I have reviewed and agree with the above information. Electronic Signature(s) Signed: 02/02/2018 5:11:36 PM By: Montey Hora Signed: 02/02/2018 5:33:52 PM By: Worthy Keeler PA-C Entered By: Worthy Keeler on 02/02/2018 10:24:20 Cheetham, Carin Hock ([id number]) -------------------------------------------------------------------------------- SuperBill Details Patient Name: [name] Date of Service: 02/02/2018 Medical Record Number: [id number] Patient Account Number: [id number] Date of Birth/Sex: [birth year] (52 y.o. 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 I10 Essential (primary) hypertension G47.30 Sleep apnea, unspecified Facility Procedures CPT4 Code: [id number] Description: 68032 - DEB SUBQ TISSUE 20 SQ CM/< ICD-10 Diagnosis Description L97.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 of other part of left foot with fat Modifier: layer exposed Quantity: 1 Physician Procedures CPT4 Code: 1224825 Description: 11042 - WC PHYS SUBQ TISS 20 SQ CM ICD-10 Diagnosis Description L97.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 of other part of left foot with fat Modifier: layer exposed Quantity: 1 Electronic Signature(s) Signed: 02/02/2018 5:33:52 PM By: Worthy Keeler PA-C Entered By: Worthy Keeler on 02/02/2018 10:25:03

## 2018-02-16 ENCOUNTER — Encounter: Payer: Medicaid Other | Attending: Physician Assistant | Admitting: Physician Assistant

## 2018-02-16 DIAGNOSIS — E11621 Type 2 diabetes mellitus with foot ulcer: Secondary | ICD-10-CM | POA: Diagnosis not present

## 2018-02-16 DIAGNOSIS — I10 Essential (primary) hypertension: Secondary | ICD-10-CM | POA: Diagnosis not present

## 2018-02-16 DIAGNOSIS — Z8249 Family history of ischemic heart disease and other diseases of the circulatory system: Secondary | ICD-10-CM | POA: Insufficient documentation

## 2018-02-16 DIAGNOSIS — F17200 Nicotine dependence, unspecified, uncomplicated: Secondary | ICD-10-CM | POA: Diagnosis not present

## 2018-02-16 DIAGNOSIS — G4733 Obstructive sleep apnea (adult) (pediatric): Secondary | ICD-10-CM | POA: Insufficient documentation

## 2018-02-16 DIAGNOSIS — E1140 Type 2 diabetes mellitus with diabetic neuropathy, unspecified: Secondary | ICD-10-CM | POA: Insufficient documentation

## 2018-02-16 DIAGNOSIS — Z833 Family history of diabetes mellitus: Secondary | ICD-10-CM | POA: Insufficient documentation

## 2018-02-16 DIAGNOSIS — G9009 Other idiopathic peripheral autonomic neuropathy: Secondary | ICD-10-CM | POA: Diagnosis not present

## 2018-02-16 DIAGNOSIS — L97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 of other part of left foot with fat layer exposed: Secondary | ICD-10-CM | POA: Insufficient documentation

## 2018-02-16 DIAGNOSIS — J449 Chronic obstructive pulmonary disease, unspecified: Secondary | ICD-10-CM | POA: Insufficient documentation

## 2018-02-19 NOTE — Progress Notes (Signed)
TAVARI, LOADHOLT ([id number]) Visit Report for 02/16/2018 Chief Complaint Document Details Patient Name: [name], [name] Date of Service: 02/16/2018 8:00 AM Medical Record Number: [id number] Patient Account Number: [id number] Date of Birth/Sex: 09-28-1965 (52 y.o. M) Treating RN: Montey Hora Primary Care Provider: SYSTEM, PCP Other Clinician: Referring Provider: Referral, Self Treating Provider/Extender: Melburn Hake, HOYT Weeks in Treatment: 7 Information Obtained from: Patient Chief Complaint Left 1st toe ulcer Electronic Signature(s) Signed: 02/19/2018 12:50:09 AM By: Worthy Keeler PA-C Entered By: Worthy Keeler on 02/16/2018 08:18:57 Boye, [name] ([id number]) -------------------------------------------------------------------------------- Debridement Details Patient Name: [name] Date of Service: 02/16/2018 8:00 AM Medical Record Number: [id number] Patient Account Number: [id number] Date of Birth/Sex: 1966-01-08 (52 y.o. M) Treating RN: Montey Hora Primary Care Provider: SYSTEM, PCP Other Clinician: Referring Provider: Referral, Self Treating Provider/Extender: STONE III, HOYT Weeks in Treatment: 7 Debridement Performed for Wound #1 Left,Plantar Toe Great Assessment: Performed By: Physician STONE III, HOYT E., PA-C Debridement Type: Debridement Level of Consciousness (Pre- Awake and Alert procedure): Pre-procedure Verification/Time Yes - 08:23 Out Taken: Start Time: 08:23 Pain Control: Lidocaine 4% Topical Solution Total Area Debrided (L x W): 0.4 (cm) x 0.3 (cm) = 0.12 (cm) Tissue and other material Viable, Non-Viable, Callus, Slough, Subcutaneous, Slough debrided: Level: Skin/Subcutaneous Tissue Debridement Description: Excisional Instrument: Curette Bleeding: Minimum Hemostasis Achieved: Pressure End Time: 08:26 Procedural Pain: 0 Post Procedural Pain: 0 Response to Treatment: Procedure was tolerated well Level of Consciousness Awake  and Alert (Post-procedure): Post Debridement Measurements of Total Wound Length: (cm) 0.5 Width: (cm) 0.4 Depth: (cm) 0.2 Volume: (cm) 0.031 Character of Wound/Ulcer Post Debridement: Improved Post Procedure Diagnosis Same as Pre-procedure Electronic Signature(s) Signed: 02/16/2018 9:00:19 AM By: Montey Hora Signed: 02/19/2018 12:50:09 AM By: Worthy Keeler PA-C Entered By: Montey Hora on 02/16/2018 09:00:19 [name], [name] ([id number]) -------------------------------------------------------------------------------- HPI Details Patient Name: [name] Date of Service: 02/16/2018 8:00 AM Medical Record Number: [id number] Patient Account Number: [id number] Date of Birth/Sex: [birth year] (52 y.o. M) Treating RN: Montey Hora Primary Care Provider: SYSTEM, PCP Other Clinician: Referring Provider: Referral, Self Treating Provider/Extender: STONE III, HOYT Weeks in Treatment: 7 History of Present Illness HPI Description: 12/29/17 on evaluation today patient presents for an injury due to a trauma on the left great toe plantar aspect. 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 We did review his notes and records as well and there appears to be no evidence of a formal diagnosis of diabetes. 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 At this point the patient has no pain in regard to the wound on his left great toe. In general he seems to be doing excellent in this regard. Nonetheless he does have a wound with callous surrounding there appears to be some new epithelialization but in general the healing seems to be very slow according to the patient. He was seen in the ER at [facility] on 11/24/17 where he was prescribed Bactrim along with Keflex for infection. That has been completed at this  point. No fevers, chills, nausea, or vomiting noted at this time. 01/05/18 on evaluation today patient actually appears to be doing rather well in regard to the left great toe ulcer. He has been tolerating the dressing changes without complication. Fortunately there does not appear to be any evidence  of infection at this time. No fevers, chills, nausea, or vomiting noted at this time. I did review patient's x-ray which she did have in the interim since I last saw him. This was negative for any signs of acute osteomyelitis or bony injury. 01/19/18 upon evaluation today patient actually appears to be doing very well in regard to his toe ulcer. The show signs of some good epithelialization he does have some callous noted on the plantar aspect of the wound location but again this is minimal compared to previous. Overall I feel like he's making good progress. Fortunately there does not appear to be any evidence of infection at this time he's wearing the offloading shoe and from the standpoint of the overall appearance of the wound I would say he's wearing this on a regular basis. Fortunately his x-ray did come back negative for any evidence of a bone abnormality. 02/02/18 on evaluation today patient actually appears to be doing better in regard to his foot ulcer. He continues to show signs of improvement. I do believe that our offloading measures have been for the most part successful. Overall I'm very pleased in this regard. No fevers, chills, nausea, or vomiting noted at this time. 02/16/18 on evaluation today patient appears to actually be doing rather well in regard to the overall appearance of his toe ulcer. He does have some callous on the wound bed which did require sharp debridement today. There was some Slough noted on the surface as well. No fevers, chills, nausea, or vomiting noted at this time. Electronic Signature(s) Signed: 02/19/2018 12:50:09 AM By: Worthy Keeler PA-C Entered By: Worthy Keeler on 02/16/2018 08:30:35 [name], [name] ([id number]) -------------------------------------------------------------------------------- Physical Exam Details Patient Name: [name] Date of Service: 02/16/2018 8:00 AM Medical Record Number: [id number] Patient Account Number: [id number] Date of Birth/Sex: 01-25-1966 (52 y.o. M) Treating RN: Montey Hora Primary Care Provider: SYSTEM, PCP Other Clinician: Referring Provider: Referral, Self Treating Provider/Extender: STONE III, HOYT Weeks in Treatment: 7 Constitutional Well-nourished and well-hydrated in no acute distress. Respiratory normal breathing without difficulty. Psychiatric this patient is able to make decisions and demonstrates good insight into disease process. Alert and Oriented x 3. pleasant and cooperative. Notes Patient's wound bed currently did require sharp debridement post debridement the wound bed appears to be doing significantly better which is great news. Fortunately there is no sign of infection at this time. Electronic Signature(s) Signed: 02/19/2018 12:50:09 AM By: Worthy Keeler PA-C Entered By: Worthy Keeler on 02/16/2018 08:31:13 [name], [name] ([id number]) -------------------------------------------------------------------------------- Physician Orders Details Patient Name: [name] Date of Service: 02/16/2018 8:00 AM Medical Record Number: [id number] Patient Account Number: [id number] Date of Birth/Sex: 14-Feb-1966 (52 y.o. 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 o May Shower, gently pat wound dry prior to applying new dressing. Anesthetic (add to Medication List) Wound #1 Left,Plantar Toe Great o Topical Lidocaine 4% cream applied to wound bed prior to debridement (In Clinic Only). Primary Wound Dressing Wound #1 Left,Plantar Toe Great o Silver Collagen Secondary Dressing Wound #1 Left,Plantar Toe Great o Conform/Kerlix  Dressing Change Frequency Wound #1 Left,Plantar Toe Great o Change dressing every day. Follow-up Appointments Wound #1 Left,Plantar Toe Great o Return Appointment in 2 weeks. Off-Loading Wound #1 Left,Plantar Toe Great o Other: - Darco shoe with peg assist Electronic Signature(s) Signed: 02/16/2018 8:36:50 AM By: Montey Hora Signed: 02/19/2018 12:50:09 AM By: Irean Hong Carachure, Shickley ([id number]) Entered By: Montey Hora on 02/16/2018 08:36:50 Detter, [name] ([id number]) -------------------------------------------------------------------------------- Problem List Details Patient Name: [name] Date of Service: 02/16/2018 8:00 AM Medical Record Number: [id number] Patient Account Number: [id number] Date of Birth/Sex: [birth year] (52 y.o. M) Treating RN: Montey Hora Primary Care Provider: SYSTEM, PCP Other Clinician: Referring Provider: Referral, Self Treating Provider/Extender: STONE III, HOYT Weeks in Treatment: 7 Active Problems ICD-10 Evaluated Encounter Code Description Active Date Today Diagnosis G90.09 Other idiopathic peripheral autonomic neuropathy 12/29/2017 No Yes L97.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 of other part of left foot with fat 12/29/2017 No Yes layer exposed [place] (primary) hypertension 12/29/2017 No Yes G47.30 Sleep apnea, unspecified 12/29/2017 No Yes Inactive Problems Resolved Problems Electronic Signature(s) Signed: 02/19/2018 12:50:09 AM  By: Worthy Keeler PA-C Entered By: Worthy Keeler on 02/16/2018 08:17:58 [name], [name] ([id number]) -------------------------------------------------------------------------------- Progress Note Details Patient Name: [name] Date of Service: 02/16/2018 8:00 AM Medical Record Number: [id number] Patient Account Number: [id number] Date of Birth/Sex: Dec 04, 1965 (52 y.o. M) Treating RN: Montey Hora Primary Care Provider: SYSTEM, PCP Other Clinician: Referring Provider: Referral, Self Treating Provider/Extender: STONE III, HOYT Weeks in Treatment: 7 Subjective Chief Complaint Information obtained from Patient Left 1st toe ulcer History of Present Illness (HPI) 12/29/17 on evaluation today patient presents for an injury due to a trauma on the left great toe plantar aspect. 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 We did review his notes and records as well and there appears to be no evidence of a formal diagnosis of diabetes. 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 At this point the patient has no pain in regard to the wound on his left great toe. In general he seems to be doing excellent in this regard. Nonetheless he does have a wound with callous surrounding there appears to be some new epithelialization but in general the healing seems to be very slow according to the patient. He was seen in the ER at [facility] on 11/24/17 where he was prescribed Bactrim along with Keflex for infection. That has been completed at this point. No fevers, chills, nausea, or vomiting noted at this time. 01/05/18 on evaluation today patient actually appears to be doing rather well in regard to the left great toe ulcer. He has been tolerating the dressing changes without complication. Fortunately there does  not appear to be any evidence of infection at this time. No fevers, chills, nausea, or vomiting noted at this time. I did review patient's x-ray which she did have in the interim since I last saw him. This was negative for any signs of acute osteomyelitis or bony injury. 01/19/18 upon evaluation today patient actually appears to be doing very well in regard to his toe ulcer. The show signs of some good epithelialization he does have some callous noted on the plantar aspect of the wound location but again this is minimal compared to previous. Overall I feel like he's making  good progress. Fortunately there does not appear to be any evidence of infection at this time he's wearing the offloading shoe and from the standpoint of the overall appearance of the wound I would say he's wearing this on a regular basis. Fortunately his x-ray did come back negative for any evidence of a bone abnormality. 02/02/18 on evaluation today patient actually appears to be doing better in regard to his foot ulcer. He continues to show signs of improvement. I do believe that our offloading measures have been for the most part successful. Overall I'm very pleased in this regard. No fevers, chills, nausea, or vomiting noted at this time. 02/16/18 on evaluation today patient appears to actually be doing rather well in regard to the overall appearance of his toe ulcer. He does have some callous on the wound bed which did require sharp debridement today. There was some Slough noted on the surface as well. No fevers, chills, nausea, or vomiting noted at this time. Patient History Information obtained from Patient. Family History Cancer - Mother,Father, Diabetes - Mother, Hypertension - Father, No family history of Kidney Disease, Lung Disease, Seizures, Stroke, Thyroid Problems, Tuberculosis. Social History Current every day smoker - 15 years, Marital Status - Single, Alcohol Use - Moderate, Drug Use - No History, Caffeine Use  - Never. ROSHARD, REZABEK ([id number]) Medical And Surgical History Notes Endocrine Borderline Review of Systems (ROS) Constitutional Symptoms (General Health) Denies complaints or symptoms of Fever, Chills. Respiratory The patient has no complaints or symptoms. Cardiovascular The patient has no complaints or symptoms. Psychiatric The patient has no complaints or symptoms. Objective Constitutional Well-nourished and well-hydrated in no acute distress. Vitals Time Taken: 8:08 AM, Height: 74 in, Weight: 252 lbs, BMI: 32.4, Temperature: 97.5 F, Pulse: 51 bpm, Respiratory Rate: 16 breaths/min, Blood Pressure: 128/90 mmHg. Respiratory normal breathing without difficulty. Psychiatric this patient is able to make decisions and demonstrates good insight into disease process. Alert and Oriented x 3. pleasant and cooperative. General Notes: Patient's wound bed currently did require sharp debridement post debridement the wound bed appears to be doing significantly better which is great news. Fortunately there is no sign of infection at this time. Integumentary (Hair, Skin) Wound #1 status is Open. Original cause of wound was Gradually Appeared. The wound is located on the [employer]. The wound measures 0.4cm length x 0.3cm width x 0.1cm depth; 0.094cm^2 area and 0.009cm^3 volume. There is Fat Layer (Subcutaneous Tissue) Exposed exposed. There is no tunneling or undermining noted. There is a small amount of serous drainage noted. The wound margin is flat and intact. There is no granulation within the wound bed. There is no necrotic tissue within the wound bed. The periwound skin appearance exhibited: Callus, Maceration. The periwound skin appearance did not exhibit: Crepitus, Excoriation, Induration, Rash, Scarring, Dry/Scaly, Atrophie Blanche, Cyanosis, Ecchymosis, Hemosiderin Staining, Mottled, Pallor, Rubor, Erythema. Periwound temperature was noted as No Abnormality. The  periwound has tenderness on palpation. Assessment [name], [name] ([id number]) Active Problems ICD-10 Other idiopathic peripheral autonomic neuropathy Non-pressure chronic ulcer of other part of left foot with fat layer exposed Essential (primary) hypertension Sleep apnea, unspecified Procedures Wound #1 Pre-procedure diagnosis of Wound #1 is a Neuropathic Ulcer-Non Diabetic located on the Left,Plantar Toe Great . There was a Excisional Skin/Subcutaneous Tissue Debridement with a total area of 0.12 sq cm performed by STONE III, HOYT E., PA-C. With the following instrument(s): Curette to remove Viable and Non-Viable tissue/material. Material removed includes Callus, Subcutaneous Tissue, and Slough after  achieving pain control using Lidocaine 4% Topical Solution. No specimens were taken. A time out was conducted at 08:23, prior to the start of the procedure. A Minimum amount of bleeding was controlled with Pressure. The procedure was tolerated well with a pain level of 0 throughout and a pain level of 0 following the procedure. Post Debridement Measurements: 0.5cm length x 0.4cm width x 0.2cm depth; 0.031cm^3 volume. Character of Wound/Ulcer Post Debridement is improved. Post procedure Diagnosis Wound #1: Same as Pre-Procedure Plan Wound Cleansing: Wound #1 Left,Plantar Toe Great: Clean wound with Normal Saline. May Shower, gently pat wound dry prior to applying new dressing. Anesthetic (add to Medication List): Wound #1 Left,Plantar Toe Great: Topical Lidocaine 4% cream applied to wound bed prior to debridement (In Clinic Only). Primary Wound Dressing: Wound #1 Left,Plantar Toe Great: Silver Collagen Secondary Dressing: Wound #1 Left,Plantar Toe Great: Conform/Kerlix Dressing Change Frequency: Wound #1 Left,Plantar Toe Great: Change dressing every day. Follow-up Appointments: Wound #1 Left,Plantar Toe Great: Return Appointment in 2 weeks. Off-Loading: Wound #1 Left,Plantar  Toe Great: Other: - Darco shoe with peg assist [name], [name] T. ([id number]) I'm gonna recommend that we continue with the above wound care measures. The patient is in agreement the plan. If anything changes or worsens meantime he will contact the office and let me know. Please see above for specific wound care orders. We will see patient for re-evaluation in 1 week(s) here in the clinic. If anything worsens or changes patient will contact our office for additional recommendations. Electronic Signature(s) Signed: 02/19/2018 12:50:09 AM By: Worthy Keeler PA-C Entered By: Worthy Keeler on 02/16/2018 09:32:01 [name], [name] ([id number]) -------------------------------------------------------------------------------- ROS/PFSH Details Patient Name: [name] Date of Service: 02/16/2018 8:00 AM Medical Record Number: [id number] Patient Account Number: [id number] Date of Birth/Sex: [birth year] (52 y.o. M) Treating RN: Montey Hora Primary Care Provider: SYSTEM, PCP Other Clinician: Referring Provider: Referral, Self Treating Provider/Extender: STONE III, HOYT Weeks in Treatment: 7 Information Obtained From Patient Wound History Do you currently have one or more open woundso Yes How many open wounds do you currently haveo 1 Approximately how long have you had your woundso 2 months How have you been treating your wound(s) until nowo neosporin Has your wound(s) ever healed and then re-openedo No Have you had any lab work done in the past montho Yes Who ordered the lab work doneo Dr. Ivar Bury Have you tested positive for an antibiotic resistant organism (MRSA, VRE)o No Have you tested positive for osteomyelitis (bone infection)o No Have you had any tests for circulation on your legso No Constitutional Symptoms (General Health) Complaints and Symptoms: Negative for: Fever; Chills Eyes Medical History: Positive for: Cataracts - Left Negative for: Glaucoma; Optic  Neuritis Ear/Nose/Mouth/Throat Medical History: Negative for: Chronic sinus problems/congestion; Middle ear problems Hematologic/Lymphatic Medical History: Negative for: Anemia; Hemophilia; Human Immunodeficiency Virus; Sickle Cell Disease Respiratory Complaints and Symptoms: No Complaints or Symptoms Medical History: Positive for: Asthma; Chronic Obstructive Pulmonary Disease (COPD); Sleep Apnea - C-pap Negative for: Aspiration; Pneumothorax; Tuberculosis Cardiovascular Complaints and Symptoms: No Complaints or Symptoms Askin, Zuri T. ([id number]) Medical History: Negative for: Angina; Arrhythmia; Congestive Heart Failure; Coronary Artery Disease; Deep Vein Thrombosis; Hypertension; Hypotension; Myocardial Infarction; Peripheral Arterial Disease; Peripheral Venous Disease; Phlebitis; Vasculitis Gastrointestinal Medical History: Negative for: Cirrhosis ; Colitis; Crohnos; Hepatitis A; Hepatitis B; Hepatitis C Endocrine Medical History: Negative for: Type I Diabetes; Type II Diabetes Past Medical History Notes: Borderline Integumentary (Skin) Medical History: Negative for: History of Burn; History of pressure  wounds Musculoskeletal Medical History: Negative for: Gout; Rheumatoid Arthritis; Osteoarthritis; Osteomyelitis Neurologic Medical History: Positive for: Neuropathy - Feet Negative for: Dementia; Quadriplegia; Paraplegia; Seizure Disorder Oncologic Medical History: Negative for: Received Chemotherapy; Received Radiation Psychiatric Complaints and Symptoms: No Complaints or Symptoms Medical History: Negative for: Anorexia/bulimia; Confinement Anxiety HBO Extended History Items Eyes: Cataracts Immunizations Pneumococcal Vaccine: Received Pneumococcal Vaccination: No Implantable Devices Family and Social History Cancer: Yes - Mother,Father; Diabetes: Yes - Mother; Hypertension: Yes - Father; Kidney Disease: No; Lung Disease: No; Seizures: No; Stroke: No;  Thyroid Problems: No; Tuberculosis: No; Current every day smoker - 15 years; Marital Status - [name], [name] T. ([id number]) Single; Alcohol Use: Moderate; Drug Use: No History; Caffeine Use: Never; Advanced Directives: No; Patient does not want information on Advanced Directives; Do not resuscitate: No; Living Will: No; Medical Power of Attorney: No Physician Affirmation I have reviewed and agree with the above information. Electronic Signature(s) Signed: 02/16/2018 3:45:44 PM By: Montey Hora Signed: 02/19/2018 12:50:09 AM By: Worthy Keeler PA-C Entered By: Worthy Keeler on 02/16/2018 08:31:01 [name], [name] ([id number]) -------------------------------------------------------------------------------- SuperBill Details Patient Name: [name] Date of Service: 02/16/2018 Medical Record Number: [id number] Patient Account Number: [id number] Date of Birth/Sex: 02-23-1966 (51 y.o. 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 I10 Essential (primary) hypertension G47.30 Sleep apnea, unspecified Facility Procedures CPT4 Code: [id number] Description: [place] VISIT-LEV 3 EST PT Modifier: Quantity: 1 CPT4 Code: 76283151 Description: Real - DEB SUBQ TISSUE 20 SQ CM/< ICD-10 Diagnosis Description L97.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 of other part of left foot with fat Modifier: layer exposed Quantity: 1 Physician Procedures CPT4 Code: 7616073 Description: 11042 - WC PHYS SUBQ TISS 20 SQ CM ICD-10 Diagnosis Description L97.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 of other part of left foot with fat Modifier: layer exposed Quantity: 1 Electronic  Signature(s) Signed: 02/19/2018 12:50:09 AM By: Worthy Keeler PA-C Entered By: Worthy Keeler on 02/16/2018 09:32:08

## 2018-02-22 NOTE — Progress Notes (Signed)
SAVVAS, ROPER ([id number]) Visit Report for 02/16/2018 Arrival Information Details Patient Name: [name], [name] Date of Service: 02/16/2018 8:00 AM Medical Record Number: [id number] Patient Account Number: [id number] Date of Birth/Sex: 12/23/1965 (52 y.o. M) Treating RN: Montey Hora Primary Care Asa Baudoin: SYSTEM, PCP Other Clinician: Referring Bee Marchiano: Referral, Self Treating Madigan Rosensteel/Extender: STONE III, HOYT Weeks in Treatment: 7 Visit Information History Since Last Visit Added or deleted any medications: No Patient Arrived: Ambulatory Any new allergies or adverse reactions: No Arrival Time: 08:07 Had a fall or experienced change in No Accompanied By: self activities of daily living that may affect Transfer Assistance: None risk of falls: Patient Identification Verified: Yes Signs or symptoms of abuse/neglect since last visito No Secondary Verification Process Yes Hospitalized since last visit: No Completed: Implantable device outside of the clinic excluding No Patient Has Alerts: Yes cellular tissue based products placed in the center Patient Alerts: Borderline since last visit: Diabetic Has Dressing in Place as Prescribed: Yes Pain Present Now: No Electronic Signature(s) Signed: 02/16/2018 4:44:00 PM By: Lorine Bears RCP, RRT, CHT Entered By: Lorine Bears on 02/16/2018 08:08:11 Iott, Carin Hock ([id number]) -------------------------------------------------------------------------------- Clinic Level of Care Assessment Details Patient Name: [name] Date of Service: 02/16/2018 8:00 AM Medical Record Number: [id number] Patient Account Number: [id number] Date of Birth/Sex: 07/16/1965 (52 y.o. M) Treating RN: Montey Hora Primary Care Calahan Pak: SYSTEM, PCP Other Clinician: Referring Omarie Parcell: Referral, Self Treating Alex Mcmanigal/Extender: STONE III, HOYT Weeks in Treatment: 7 Clinic Level of Care Assessment Items TOOL 4  Quantity Score []  - Use when only an EandM is performed on FOLLOW-UP visit 0 ASSESSMENTS - Nursing Assessment / Reassessment X - Reassessment of Co-morbidities (includes updates in patient status) 1 10 X- 1 5 Reassessment of Adherence to Treatment Plan ASSESSMENTS - Wound and Skin Assessment / Reassessment X - Simple Wound Assessment / Reassessment - one wound 1 5 []  - 0 Complex Wound Assessment / Reassessment - multiple wounds []  - 0 Dermatologic / Skin Assessment (not related to wound area) ASSESSMENTS - Focused Assessment []  - Circumferential Edema Measurements - multi extremities 0 []  - 0 Nutritional Assessment / Counseling / Intervention X- 1 5 Lower Extremity Assessment (monofilament, tuning fork, pulses) []  - 0 Peripheral Arterial Disease Assessment (using hand held doppler) ASSESSMENTS - Ostomy and/or Continence Assessment and Care []  - Incontinence Assessment and Management 0 []  - 0 Ostomy Care Assessment and Management (repouching, etc.) PROCESS - Coordination of Care X - Simple Patient / Family Education for ongoing care 1 15 []  - 0 Complex (extensive) Patient / Family Education for ongoing care []  - 0 Staff obtains Programmer, systems, Records, Test Results / Process Orders []  - 0 Staff telephones HHA, Nursing Homes / Clarify orders / etc []  - 0 Routine Transfer to another Facility (non-emergent condition) []  - 0 Routine Hospital Admission (non-emergent condition) []  - 0 New Admissions / Biomedical engineer / Ordering NPWT, Apligraf, etc. []  - 0 Emergency Hospital Admission (emergent condition) X- 1 10 Simple Discharge Coordination Nardone, Suhaan T. ([id number]) []  - 0 Complex (extensive) Discharge Coordination PROCESS - Special Needs []  - Pediatric / Minor Patient Management 0 []  - 0 Isolation Patient Management []  - 0 Hearing / Language / Visual special needs []  - 0 Assessment of Community assistance (transportation, D/C planning, etc.) []  - 0 Additional  assistance / Altered mentation []  - 0 Support Surface(s) Assessment (bed, cushion, seat, etc.) INTERVENTIONS - Wound Cleansing / Measurement X - Simple Wound Cleansing - one wound 1 5 []  -  0 Complex Wound Cleansing - multiple wounds X- 1 5 Wound Imaging (photographs - any number of wounds) []  - 0 Wound Tracing (instead of photographs) X- 1 5 Simple Wound Measurement - one wound []  - 0 Complex Wound Measurement - multiple wounds INTERVENTIONS - Wound Dressings X - Small Wound Dressing one or multiple wounds 1 10 []  - 0 Medium Wound Dressing one or multiple wounds []  - 0 Large Wound Dressing one or multiple wounds []  - 0 Application of Medications - topical []  - 0 Application of Medications - injection INTERVENTIONS - Miscellaneous []  - External ear exam 0 []  - 0 Specimen Collection (cultures, biopsies, blood, body fluids, etc.) []  - 0 Specimen(s) / Culture(s) sent or taken to Lab for analysis []  - 0 Patient Transfer (multiple staff / Civil Service fast streamer / Similar devices) []  - 0 Simple Staple / Suture removal (25 or less) []  - 0 Complex Staple / Suture removal (26 or more) []  - 0 Hypo / Hyperglycemic Management (close monitor of Blood Glucose) []  - 0 Ankle / Brachial Index (ABI) - do not check if billed separately X- 1 5 Vital Signs Doyle, Jastin T. ([id number]) Has the patient been seen at the hospital within the last three years: Yes Total Score: 80 Level Of Care: New/Established - Level 3 Electronic Signature(s) Signed: 02/16/2018 3:45:44 PM By: Montey Hora Entered By: Montey Hora on 02/16/2018 08:39:24 Beaumont, Carin Hock ([id number]) -------------------------------------------------------------------------------- Encounter Discharge Information Details Patient Name: [name] Date of Service: 02/16/2018 8:00 AM Medical Record Number: [id number] Patient Account Number: [id number] Date of Birth/Sex: May 08, 1965 (52 y.o. M) Treating RN: Montey Hora Primary  Care Torris House: SYSTEM, PCP Other Clinician: Referring Keyion Knack: Referral, Self Treating Tamico Mundo/Extender: STONE III, HOYT Weeks in Treatment: 7 Encounter Discharge Information Items Post Procedure Vitals Discharge Condition: Stable Temperature (F): 97.5 Ambulatory Status: Ambulatory Pulse (bpm): 51 Discharge Destination: Home Respiratory Rate (breaths/min): 18 Transportation: Private Auto Blood Pressure (mmHg): 128/90 Accompanied By: self Schedule Follow-up Appointment: Yes Clinical Summary of Care: Electronic Signature(s) Signed: 02/16/2018 9:01:24 AM By: Montey Hora Entered By: Montey Hora on 02/16/2018 09:01:24 Ngu, Carin Hock ([id number]) -------------------------------------------------------------------------------- Lower Extremity Assessment Details Patient Name: [name] Date of Service: 02/16/2018 8:00 AM Medical Record Number: [id number] Patient Account Number: [id number] Date of Birth/Sex: December 06, 1965 (52 y.o. M) Treating RN: Harold Barban Primary Care Clayvon Parlett: SYSTEM, PCP Other Clinician: Referring Windsor Goeken: Referral, Self Treating Margarete Horace/Extender: STONE III, HOYT Weeks in Treatment: 7 Electronic Signature(s) Signed: 02/20/2018 5:05:35 PM By: Harold Barban Entered By: Harold Barban on 02/16/2018 08:18:28 Carrender, Carin Hock ([id number]) -------------------------------------------------------------------------------- Multi Wound Chart Details Patient Name: [name] Date of Service: 02/16/2018 8:00 AM Medical Record Number: [id number] Patient Account Number: [id number] Date of Birth/Sex: 1965/12/22 (52 y.o. 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] [N/A:N/A] Comorbid History: [1:Cataracts, Asthma, Chronic Obstructive Pulmonary Disease (COPD), Sleep Apnea, Neuropathy] [N/A:N/A] Date Acquired: [1:10/12/2017] [N/A:N/A] Weeks of Treatment: [1:7] [N/A:N/A] Wound Status: [1:Open] [N/A:N/A] Measurements L x W x D [1:0.4x0.3x0.1] [N/A:N/A] (cm) Area (cm) : [1:0.094] [N/A:N/A] Volume (cm) : [1:0.009] [N/A:N/A] % Reduction in Area: [1:92.50%] [N/A:N/A] % Reduction in Volume: [1:96.40%] [N/A:N/A] Classification: [1:Full Thickness Without Exposed  Support Structures] [N/A:N/A] Exudate Amount: [1:Small] [N/A:N/A] Exudate Type: [1:Serous] [N/A:N/A] Exudate Color: [1:amber] [N/A:N/A] Wound Margin: [1:Flat and Intact] [N/A:N/A] Granulation Amount: [1:None Present (0%)] [N/A:N/A] Necrotic Amount: [1:None Present (0%)] [N/A:N/A] Exposed Structures: [1:Fat Layer (Subcutaneous Tissue) Exposed: Yes Fascia: No Tendon: No Muscle: No Joint: No Bone: No] [N/A:N/A] Epithelialization: [1:None] [N/A:N/A] Periwound Skin Texture: [1:Callus: Yes Excoriation: No Induration: No] [N/A:N/A] Crepitus: No Rash: No Scarring: No Periwound Skin Moisture: Maceration: Yes N/A N/A Dry/Scaly: No Periwound Skin Color: Atrophie Blanche: No N/A N/A Cyanosis: No Ecchymosis: No Erythema: No Hemosiderin Staining: No Mottled: No Pallor: No Rubor: No Temperature: No Abnormality N/A N/A Tenderness on Palpation: Yes N/A N/A Wound Preparation: Ulcer Cleansing: N/A N/A Rinsed/Irrigated with Saline Topical Anesthetic Applied: Other: lidocaine 4% Treatment Notes Electronic Signature(s) Signed: 02/16/2018 3:45:44 PM By: Montey Hora Entered By: Montey Hora on 02/16/2018 08:24:08 MCSWAINCarin Hock  ([id number]) -------------------------------------------------------------------------------- [place] Details Patient Name: [name] Date of Service: 02/16/2018 8:00 AM Medical Record Number: [id number] Patient Account Number: [id number] Date of Birth/Sex: [birth year] (52 y.o. M) Treating RN: Montey Hora Primary Care Ilze Roselli: SYSTEM, PCP Other Clinician: Referring Dreyah Montrose: Referral, Self Treating Tenecia Ignasiak/Extender: STONE III, HOYT Weeks in Treatment: 7 Active Inactive Abuse / Safety / Falls / Self Care Management Nursing Diagnoses: History of Falls Goals: Patient will remain injury free related to falls Date Initiated: 12/29/2017 Target Resolution Date: 03/09/2018 Goal Status: Active Interventions: Assess fall risk on admission and as needed Notes: Orientation to the Wound Care Program Nursing Diagnoses: Knowledge deficit related to the wound healing center program Goals: Patient/caregiver will verbalize understanding of the [facility] Program Date Initiated: 12/29/2017 Target Resolution Date: 03/09/2018 Goal Status: Active Interventions: Provide education on orientation to the wound center Notes: Wound/Skin Impairment Nursing Diagnoses: Impaired tissue integrity Goals: Ulcer/skin breakdown will heal within 14 weeks Date Initiated: 12/29/2017 Target Resolution Date: 03/09/2018 Goal Status: Active Interventions: Assess patient/caregiver ability to obtain necessary supplies Eades, Torian T. ([id number]) Assess patient/caregiver ability to perform ulcer/skin care regimen upon admission and as needed Assess ulceration(s) every visit Notes: Electronic Signature(s) Signed: 02/16/2018 3:45:44 PM By: Montey Hora Entered By: Montey Hora on 02/16/2018 08:23:54 Matarazzo, Carin Hock ([id number]) -------------------------------------------------------------------------------- Pain Assessment Details Patient Name: [name] Date of Service: 02/16/2018 8:00 AM Medical Record Number: [id number] Patient Account Number: [id number] Date of Birth/Sex: 06/09/1965 (52 y.o. M) Treating RN: Montey Hora Primary Care Corynn Solberg: SYSTEM, PCP Other Clinician: Referring Keeven Matty: Referral, Self Treating Lynasia Meloche/Extender: STONE III, HOYT Weeks in Treatment: 7 Active Problems Location of Pain Severity and Description of Pain Patient Has Paino No Site Locations Pain Management and Medication Current Pain Management: Electronic Signature(s) Signed: 02/16/2018 3:45:44 PM By: Montey Hora Signed: 02/16/2018 4:44:00 PM By: Lorine Bears RCP, RRT, CHT Entered By: Lorine Bears on 02/16/2018 08:08:18 Pelcher, Carin Hock ([id number]) -------------------------------------------------------------------------------- Patient/Caregiver Education Details Patient Name: [name] Date of Service: 02/16/2018 8:00 AM Medical Record Number: [id number] Patient Account Number: [id number] Date of Birth/Gender: 01/08/1966 (52 y.o. M) Treating RN: Montey Hora Primary Care Physician: SYSTEM, PCP Other Clinician: Referring Physician: Referral, Self Treating Physician/Extender: Melburn Hake, HOYT Weeks in Treatment: 7 Education Assessment Education Provided To: Patient Education Topics Provided Wound/Skin Impairment: Handouts: Other: wound care as ordered Methods: Demonstration, Explain/Verbal Responses: State content correctly Electronic Signature(s) Signed: 02/16/2018 3:45:44 PM By: Montey Hora Entered By: Montey Hora on 02/16/2018 09:00:37 Snider, Carin Hock ([id number]) -------------------------------------------------------------------------------- Wound Assessment Details Patient Name: [name] Date of Service: 02/16/2018 8:00 AM Medical Record  Number: [id number] Patient Account Number: [id number] Date of Birth/Sex: 1965-12-08 (52 y.o. M) Treating RN: Harold Barban Primary Care  Kauri Garson: SYSTEM, PCP Other Clinician: Referring Merrick Maggio: Referral, Self Treating Kabao Leite/Extender: STONE III, HOYT Weeks in Treatment: 7 Wound Status Wound Number: 1 Primary Neuropathic Ulcer-Non Diabetic Etiology: Wound Location: Left Toe Great - Plantar Wound Open Wounding Event: Gradually Appeared Status: Date Acquired: 10/12/2017 Comorbid Cataracts, Asthma, Chronic Obstructive Weeks Of Treatment: 7 History: Pulmonary Disease (COPD), Sleep Apnea, Clustered Wound: No Neuropathy Photos Photo Uploaded By: Harold Barban on 02/16/2018 08:42:48 Wound Measurements Length: (cm) 0.4 Width: (cm) 0.3 Depth: (cm) 0.1 Area: (cm) 0.094 Volume: (cm) 0.009 % Reduction in Area: 92.5% % Reduction in Volume: 96.4% Epithelialization: None Tunneling: No Undermining: No Wound Description Full Thickness Without Exposed Support Classification: Structures Wound Margin: Flat and Intact Exudate Small Amount: Exudate Type: Serous Exudate Color: amber Foul Odor After Cleansing: No Slough/Fibrino Yes Wound Bed Granulation Amount: None Present (0%) Exposed Structure Necrotic Amount: None Present (0%) Fascia Exposed: No Fat Layer (Subcutaneous Tissue) Exposed: Yes Tendon Exposed: No Muscle Exposed: No Joint Exposed: No Bone Exposed: No Grays, Howie T. ([id number]) Periwound Skin Texture Texture Color No Abnormalities Noted: No No Abnormalities Noted: No Callus: Yes Atrophie Blanche: No Crepitus: No Cyanosis: No Excoriation: No Ecchymosis: No Induration: No Erythema: No Rash: No Hemosiderin Staining: No Scarring: No Mottled: No Pallor: No Moisture Rubor: No No Abnormalities Noted: No Dry / Scaly: No Temperature / Pain Maceration: Yes Temperature: No Abnormality Tenderness on Palpation: Yes Wound Preparation Ulcer Cleansing: Rinsed/Irrigated with Saline Topical Anesthetic Applied: Other: lidocaine 4%, Treatment Notes Wound #1 (Left, Plantar Toe  Great) Notes prisma, gauze/conform with darco with peg assist and felt for offloading Electronic Signature(s) Signed: 02/20/2018 5:05:35 PM By: Harold Barban Entered By: Harold Barban on 02/16/2018 08:18:16 Ledwell, Carin Hock ([id number]) -------------------------------------------------------------------------------- Vitals Details Patient Name: [name] Date of Service: 02/16/2018 8:00 AM Medical Record Number: [id number] Patient Account Number: [id number] Date of Birth/Sex: 03/06/1966 (52 y.o. M) Treating RN: Montey Hora Primary Care Silverio Hagan: SYSTEM, PCP Other Clinician: Referring Ellen Mayol: Referral, Self Treating Theophile Harvie/Extender: STONE III, HOYT Weeks in Treatment: 7 Vital Signs Time Taken: 08:08 Temperature (F): 97.5 Height (in): 74 Pulse (bpm): 51 Weight (lbs): 252 Respiratory Rate (breaths/min): 16 Body Mass Index (BMI): 32.4 Blood Pressure (mmHg): 128/90 Reference Range: 80 - 120 mg / dl Electronic Signature(s) Signed: 02/16/2018 4:44:00 PM By: Lorine Bears RCP, RRT, CHT Entered By: Lorine Bears on 02/16/2018 08:10:47

## 2018-02-23 ENCOUNTER — Encounter: Payer: Medicaid Other | Admitting: Physician Assistant

## 2018-02-23 DIAGNOSIS — E11621 Type 2 diabetes mellitus with foot ulcer: Secondary | ICD-10-CM | POA: Diagnosis not present

## 2018-02-25 NOTE — Progress Notes (Signed)
SRIMAN, TALLY ([id number]) Visit Report for 02/23/2018 Chief Complaint Document Details Patient Name: [name], [name] Date of Service: 02/23/2018 8:00 AM Medical Record Number: [id number] Patient Account Number: [id number] Date of Birth/Sex: 01/24/1966 (52 y.o. M) Treating RN: Montey Hora Primary Care Provider: SYSTEM, PCP Other Clinician: Referring Provider: Referral, Self Treating Provider/Extender: Melburn Hake, Valincia Touch Weeks in Treatment: 8 Information Obtained from: Patient Chief Complaint Left 1st toe ulcer Electronic Signature(s) Signed: 02/23/2018 8:01:46 PM By: Worthy Keeler PA-C Entered By: Worthy Keeler on 02/23/2018 08:27:36 Germond, Carin Hock ([id number]) -------------------------------------------------------------------------------- Debridement Details Patient Name: [name] Date of Service: 02/23/2018 8:00 AM Medical Record Number: [id number] Patient Account Number: [id number] Date of Birth/Sex: 1965/08/14 (52 y.o. M) Treating RN: Montey Hora Primary Care Provider: SYSTEM, PCP Other Clinician: Referring Provider: Referral, Self Treating Provider/Extender: STONE III, Chea Malan Weeks in Treatment: 8 Debridement Performed for Wound #1 Left,Plantar Toe Great Assessment: Performed By: Physician STONE III, Salvatore Shear E., PA-C Debridement Type: Debridement Level of Consciousness (Pre- Awake and Alert procedure): Pre-procedure Verification/Time Yes - 08:31 Out Taken: Start Time: 08:31 Pain Control: Lidocaine 4% Topical Solution Total Area Debrided (L x W): 0.3 (cm) x 0.2 (cm) = 0.06 (cm) Tissue and other material Viable, Non-Viable, Callus, Slough, Subcutaneous, Slough debrided: Level: Skin/Subcutaneous Tissue Debridement Description: Excisional Instrument: Curette Bleeding: Minimum Hemostasis Achieved: Pressure End Time: 08:38 Procedural Pain: 0 Post Procedural Pain: 0 Response to Treatment: Procedure was tolerated well Level of  Consciousness Awake and Alert (Post-procedure): Post Debridement Measurements of Total Wound Length: (cm) 0.3 Width: (cm) 0.2 Depth: (cm) 0.3 Volume: (cm) 0.014 Character of Wound/Ulcer Post Debridement: Improved Post Procedure Diagnosis Same as Pre-procedure Electronic Signature(s) Signed: 02/23/2018 5:23:14 PM By: Montey Hora Signed: 02/23/2018 8:01:46 PM By: Worthy Keeler PA-C Entered By: Montey Hora on 02/23/2018 08:38:33 Regal, Carin Hock ([id number]) -------------------------------------------------------------------------------- HPI Details Patient Name: [name] Date of Service: 02/23/2018 8:00 AM Medical Record Number: [id number] Patient Account Number: [id number] Date of Birth/Sex: 22-Sep-1965 (52 y.o. M) Treating RN: Montey Hora Primary Care Provider: SYSTEM, PCP Other Clinician: Referring Provider: Referral, Self Treating Provider/Extender: STONE III, Jeno Calleros Weeks in Treatment: 8 History of Present Illness HPI Description: 12/29/17 on evaluation today patient presents for an injury due to a trauma on the left great toe plantar aspect. 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 We did review his notes and records as well and there appears to be no evidence of a formal diagnosis of diabetes. 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 At this point the patient has no pain in regard to the wound on his left great toe. In general he seems to be doing excellent in this regard. Nonetheless he does have a wound with callous surrounding there appears to be some new epithelialization but in general the healing seems to be very slow according to the patient. He was seen in the ER at [facility] on 11/24/17 where he was prescribed Bactrim along with Keflex for infection. That has  been completed at this point. No fevers, chills, nausea, or vomiting noted at this time. 01/05/18 on evaluation today patient actually appears to be doing rather well in regard to the left great toe ulcer. He has been tolerating the dressing changes without complication. Fortunately there does not appear to be any evidence  of infection at this time. No fevers, chills, nausea, or vomiting noted at this time. I did review patient's x-ray which she did have in the interim since I last saw him. This was negative for any signs of acute osteomyelitis or bony injury. 01/19/18 upon evaluation today patient actually appears to be doing very well in regard to his toe ulcer. The show signs of some good epithelialization he does have some callous noted on the plantar aspect of the wound location but again this is minimal compared to previous. Overall I feel like he's making good progress. Fortunately there does not appear to be any evidence of infection at this time he's wearing the offloading shoe and from the standpoint of the overall appearance of the wound I would say he's wearing this on a regular basis. Fortunately his x-ray did come back negative for any evidence of a bone abnormality. 02/02/18 on evaluation today patient actually appears to be doing better in regard to his foot ulcer. He continues to show signs of improvement. I do believe that our offloading measures have been for the most part successful. Overall I'm very pleased in this regard. No fevers, chills, nausea, or vomiting noted at this time. 02/16/18 on evaluation today patient appears to actually be doing rather well in regard to the overall appearance of his toe ulcer. He does have some callous on the wound bed which did require sharp debridement today. There was some Slough noted on the surface as well. No fevers, chills, nausea, or vomiting noted at this time. 02/23/18 on evaluation today patient appears to be doing rather well in  regard to his toe ulcer. He definitely seems to be doing what we've recommended an overall things seem to have healed quite nicely. It is not completely closed yet but he is definitely on the right track and I feel it will close over the next 1 to 2 weeks potentially. Electronic Signature(s) Signed: 02/23/2018 8:01:46 PM By: Worthy Keeler PA-C Entered By: Worthy Keeler on 02/23/2018 08:43:16 Guidice, Carin Hock ([id number]) -------------------------------------------------------------------------------- Physical Exam Details Patient Name: [name] Date of Service: 02/23/2018 8:00 AM Medical Record Number: [id number] Patient Account Number: [id number] Date of Birth/Sex: 1965-11-18 (52 y.o. M) Treating RN: Montey Hora Primary Care Provider: SYSTEM, PCP Other Clinician: Referring Provider: Referral, Self Treating Provider/Extender: STONE III, Jakyrie Totherow Weeks in Treatment: 8 Constitutional Well-nourished and well-hydrated in no acute distress. Respiratory normal breathing without difficulty. Psychiatric this patient is able to make decisions and demonstrates good insight into disease process. Alert and Oriented x 3. pleasant and cooperative. Notes Upon inspection today patient's wound bed shows signs of significant improvement which is excellent news. He has been tolerating the dressing changes without complication. I do believe he is doing what we have recommended which is excellent news. No fevers, chills, nausea, or vomiting noted at this time. Sharp debridement was performed to clear away callous and some of the Epiboly which was happening right at the edge of the wound. He tolerated this without complication. Electronic Signature(s) Signed: 02/23/2018 8:01:46 PM By: Worthy Keeler PA-C Entered By: Worthy Keeler on 02/23/2018 08:44:05 Mingle, Carin Hock ([id number]) -------------------------------------------------------------------------------- Physician Orders  Details Patient Name: [name] Date of Service: 02/23/2018 8:00 AM Medical Record Number: [id number] Patient Account Number: [id number] Date of Birth/Sex: 10-06-1965 (52 y.o. M) Treating RN: Montey Hora Primary Care Provider: SYSTEM, PCP Other Clinician: Referring Provider: Referral, Self Treating Provider/Extender: STONE III, Shayna Eblen Weeks in Treatment: 8 Verbal / Phone  Orders: No Diagnosis Coding ICD-10 Coding Code Description G90.09 Other idiopathic peripheral autonomic neuropathy L97.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 of other part of left foot with fat layer exposed I10 Essential (primary) hypertension G47.30 Sleep apnea, unspecified Wound Cleansing Wound #1 Left,Plantar Toe Great o Clean wound with Normal Saline. o May Shower, gently pat wound dry prior to applying new dressing. Anesthetic (add to Medication List) Wound #1 Left,Plantar Toe Great o Topical Lidocaine 4% cream applied to wound bed prior to debridement (In Clinic Only). Primary Wound Dressing Wound #1 Left,Plantar Toe Great o Silver Collagen Secondary Dressing Wound #1 Left,Plantar Toe Great o Conform/Kerlix Dressing Change Frequency Wound #1 Left,Plantar Toe Great o Change dressing every day. Follow-up Appointments Wound #1 Left,Plantar Toe Great o Return Appointment in 1 week. Off-Loading Wound #1 Left,Plantar Toe Great o Other: - Darco shoe with peg assist Electronic Signature(s) Signed: 02/23/2018 5:23:14 PM By: Montey Hora Signed: 02/23/2018 8:01:46 PM By: Irean Hong Cefalu, [place] ([id number]) Entered By: Montey Hora on 02/23/2018 08:43:21 Tapper, Carin Hock ([id number]) -------------------------------------------------------------------------------- Problem List Details Patient Name: [name] Date of Service: 02/23/2018 8:00 AM Medical Record Number: [id number] Patient Account Number: [id number] Date of Birth/Sex: 08-05-1965 (52 y.o. M) Treating  RN: Montey Hora Primary Care Provider: SYSTEM, PCP Other Clinician: Referring Provider: Referral, Self Treating Provider/Extender: STONE III, Calieb Lichtman Weeks in Treatment: 8 Active Problems ICD-10 Evaluated Encounter Code Description Active Date Today Diagnosis G90.09 Other idiopathic peripheral autonomic neuropathy 12/29/2017 No Yes L97.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 of other part of left foot with fat 12/29/2017 No Yes layer exposed Har[place]ld (primary) hypertension 12/29/2017 No Yes G47.30 Sleep apnea, unspecified 12/29/2017 No Yes Inactive Problems Resolved Problems Electronic Signature(s) Signed: 02/23/2018 8:01:46 PM By: Worthy Keeler PA-C Entered By: Worthy Keeler on 02/23/2018 08:27:29 Freilich, Carin Hock ([id number]) -------------------------------------------------------------------------------- Progress Note Details Patient Name: [name] Date of Service: 02/23/2018 8:00 AM Medical Record Number: [id number] Patient Account Number: [id number] Date of Birth/Sex: 1965-05-11 (52 y.o. M) Treating RN: Montey Hora Primary Care Provider: SYSTEM, PCP Other Clinician: Referring Provider: Referral, Self Treating Provider/Extender: Melburn Hake, Merl Bommarito Weeks in Treatment: 8 Subjective Chief Complaint Information obtained from Patient Left 1st toe ulcer History of Present Illness (HPI) 12/29/17 on evaluation today patient presents for an injury due to a trauma on the left great toe plantar aspect. 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 We did review his notes and records as well and there appears to be no evidence of a formal diagnosis of diabetes. 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 At this point the patient has no pain in regard to the wound on his left great toe. In  general he seems to be doing excellent in this regard. Nonetheless he does have a wound with callous surrounding there appears to be some new epithelialization but in general the healing seems to be very slow according to the patient. He was seen in the ER at [facility] on 11/24/17 where he was prescribed Bactrim along with Keflex for infection. That has been completed at this point. No fevers, chills, nausea, or vomiting noted at this time. 01/05/18 on evaluation today patient actually appears to be doing rather well in regard to the left great toe ulcer. He has been tolerating the dressing changes without complication.  Fortunately there does not appear to be any evidence of infection at this time. No fevers, chills, nausea, or vomiting noted at this time. I did review patient's x-ray which she did have in the interim since I last saw him. This was negative for any signs of acute osteomyelitis or bony injury. 01/19/18 upon evaluation today patient actually appears to be doing very well in regard to his toe ulcer. The show signs of some good epithelialization he does have some callous noted on the plantar aspect of the wound location but again this is minimal compared to previous. Overall I feel like he's making good progress. Fortunately there does not appear to be any evidence of infection at this time he's wearing the offloading shoe and from the standpoint of the overall appearance of the wound I would say he's wearing this on a regular basis. Fortunately his x-ray did come back negative for any evidence of a bone abnormality. 02/02/18 on evaluation today patient actually appears to be doing better in regard to his foot ulcer. He continues to show signs of improvement. I do believe that our offloading measures have been for the most part successful. Overall I'm very pleased in this regard. No fevers, chills, nausea, or vomiting noted at this time. 02/16/18 on evaluation today patient appears to actually  be doing rather well in regard to the overall appearance of his toe ulcer. He does have some callous on the wound bed which did require sharp debridement today. There was some Slough noted on the surface as well. No fevers, chills, nausea, or vomiting noted at this time. 02/23/18 on evaluation today patient appears to be doing rather well in regard to his toe ulcer. He definitely seems to be doing what we've recommended an overall things seem to have healed quite nicely. It is not completely closed yet but he is definitely on the right track and I feel it will close over the next 1 to 2 weeks potentially. Patient History Information obtained from Patient. Family History Cancer - Mother,Father, Diabetes - Mother, Hypertension - Father, No family history of Kidney Disease, Lung Disease, Seizures, Stroke, Thyroid Problems, Tuberculosis. Monestime, OLON RUSS ([id number]) Social History Current every day smoker - 15 years, Marital Status - Single, Alcohol Use - Moderate, Drug Use - No History, Caffeine Use - Never. Medical And Surgical History Notes Endocrine Borderline Review of Systems (ROS) Constitutional Symptoms (General Health) Denies complaints or symptoms of Fever, Chills. Respiratory The patient has no complaints or symptoms. Cardiovascular The patient has no complaints or symptoms. Psychiatric The patient has no complaints or symptoms. Objective Constitutional Well-nourished and well-hydrated in no acute distress. Vitals Time Taken: 8:09 AM, Height: 74 in, Weight: 252 lbs, BMI: 32.4, Temperature: 97.6 F, Pulse: 60 bpm, Respiratory Rate: 16 breaths/min, Blood Pressure: 116/90 mmHg. Respiratory normal breathing without difficulty. Psychiatric this patient is able to make decisions and demonstrates good insight into disease process. Alert and Oriented x 3. pleasant and cooperative. General Notes: Upon inspection today patient's wound bed shows signs of significant improvement  which is excellent news. He has been tolerating the dressing changes without complication. I do believe he is doing what we have recommended which is excellent news. No fevers, chills, nausea, or vomiting noted at this time. Sharp debridement was performed to clear away callous and some of the Epiboly which was happening right at the edge of the wound. He tolerated this without complication. Integumentary (Hair, Skin) Wound #1 status is Open. Original cause of wound was  Gradually Appeared. The wound is located on the [employer]. The wound measures 0.3cm length x 0.2cm width x 0.1cm depth; 0.047cm^2 area and 0.005cm^3 volume. There is Fat Layer (Subcutaneous Tissue) Exposed exposed. There is no tunneling or undermining noted. There is a small amount of serous drainage noted. The wound margin is flat and intact. There is no granulation within the wound bed. There is no necrotic tissue within the wound bed. The periwound skin appearance exhibited: Callus, Maceration. The periwound skin appearance did not exhibit: Crepitus, Excoriation, Induration, Rash, Scarring, Dry/Scaly, Atrophie Blanche, Cyanosis, Ecchymosis, Hemosiderin Staining, Mottled, Pallor, Rubor, Erythema. Periwound temperature was noted as No Abnormality. Kadlec, Bufford T. ([id number]) The periwound has tenderness on palpation. Assessment Active Problems ICD-10 Other idiopathic peripheral autonomic neuropathy Non-pressure chronic ulcer of other part of left foot with fat layer exposed Essential (primary) hypertension Sleep apnea, unspecified Procedures Wound #1 Pre-procedure diagnosis of Wound #1 is a Neuropathic Ulcer-Non Diabetic located on the Left,Plantar Toe Great . There was a Excisional Skin/Subcutaneous Tissue Debridement with a total area of 0.06 sq cm performed by STONE III, Yajayra Feldt E., PA-C. With the following instrument(s): Curette to remove Viable and Non-Viable tissue/material. Material removed  includes Callus, Subcutaneous Tissue, and Slough after achieving pain control using Lidocaine 4% Topical Solution. No specimens were taken. A time out was conducted at 08:31, prior to the start of the procedure. A Minimum amount of bleeding was controlled with Pressure. The procedure was tolerated well with a pain level of 0 throughout and a pain level of 0 following the procedure. Post Debridement Measurements: 0.3cm length x 0.2cm width x 0.3cm depth; 0.014cm^3 volume. Character of Wound/Ulcer Post Debridement is improved. Post procedure Diagnosis Wound #1: Same as Pre-Procedure Plan Wound Cleansing: Wound #1 Left,Plantar Toe Great: Clean wound with Normal Saline. May Shower, gently pat wound dry prior to applying new dressing. Anesthetic (add to Medication List): Wound #1 Left,Plantar Toe Great: Topical Lidocaine 4% cream applied to wound bed prior to debridement (In Clinic Only). Primary Wound Dressing: Wound #1 Left,Plantar Toe Great: Silver Collagen Secondary Dressing: Wound #1 Left,Plantar Toe Great: Conform/Kerlix Dressing Change Frequency: Wound #1 Left,Plantar Toe Great: Change dressing every day. Follow-up Appointments: Wound #1 Left,Plantar Toe GreatKUMAR, FALWELL ([id number]) Return Appointment in 1 week. Off-Loading: Wound #1 Left,Plantar Toe Great: Other: - Darco shoe with peg assist Patient's wound bed appears to be doing much better this point which is great news. Overall very pleased with how things stand. We will subsequently see him back for reevaluation in one weeks time to see were things stand. Follow Please see above for specific wound care orders. We will see patient for re-evaluation in 1 week(s) here in the clinic. If anything worsens or changes patient will contact our office for additional recommendations. Electronic Signature(s) Signed: 02/23/2018 8:01:46 PM By: Worthy Keeler PA-C Entered By: Worthy Keeler on 02/23/2018 08:44:37 Seales,  Carin Hock ([id number]) -------------------------------------------------------------------------------- ROS/PFSH Details Patient Name: [name] Date of Service: 02/23/2018 8:00 AM Medical Record Number: [id number] Patient Account Number: [id number] Date of Birth/Sex: December 10, 1965 (52 y.o. M) Treating RN: Montey Hora Primary Care Provider: SYSTEM, PCP Other Clinician: Referring Provider: Referral, Self Treating Provider/Extender: STONE III, Kelby Adell Weeks in Treatment: 8 Information Obtained From Patient Wound History Do you currently have one or more open woundso Yes How many open wounds do you currently haveo 1 Approximately how long have you had your woundso 2 months How have you been treating your wound(s) until nowo neosporin  Has your wound(s) ever healed and then re-openedo No Have you had any lab work done in the past montho Yes Who ordered the lab work doneo Dr. Ivar Bury Have you tested positive for an antibiotic resistant organism (MRSA, VRE)o No Have you tested positive for osteomyelitis (bone infection)o No Have you had any tests for circulation on your legso No Constitutional Symptoms (General Health) Complaints and Symptoms: Negative for: Fever; Chills Eyes Medical History: Positive for: Cataracts - Left Negative for: Glaucoma; Optic Neuritis Ear/Nose/Mouth/Throat Medical History: Negative for: Chronic sinus problems/congestion; Middle ear problems Hematologic/Lymphatic Medical History: Negative for: Anemia; Hemophilia; Human Immunodeficiency Virus; Sickle Cell Disease Respiratory Complaints and Symptoms: No Complaints or Symptoms Medical History: Positive for: Asthma; Chronic Obstructive Pulmonary Disease (COPD); Sleep Apnea - C-pap Negative for: Aspiration; Pneumothorax; Tuberculosis Cardiovascular Complaints and Symptoms: No Complaints or Symptoms Achorn, Contrell T. ([id number]) Medical History: Negative for: Angina; Arrhythmia; Congestive Heart  Failure; Coronary Artery Disease; Deep Vein Thrombosis; Hypertension; Hypotension; Myocardial Infarction; Peripheral Arterial Disease; Peripheral Venous Disease; Phlebitis; Vasculitis Gastrointestinal Medical History: Negative for: Cirrhosis ; Colitis; Crohnos; Hepatitis A; Hepatitis B; Hepatitis C Endocrine Medical History: Negative for: Type I Diabetes; Type II Diabetes Past Medical History Notes: Borderline Integumentary (Skin) Medical History: Negative for: History of Burn; History of pressure wounds Musculoskeletal Medical History: Negative for: Gout; Rheumatoid Arthritis; Osteoarthritis; Osteomyelitis Neurologic Medical History: Positive for: Neuropathy - Feet Negative for: Dementia; Quadriplegia; Paraplegia; Seizure Disorder Oncologic Medical History: Negative for: Received Chemotherapy; Received Radiation Psychiatric Complaints and Symptoms: No Complaints or Symptoms Medical History: Negative for: Anorexia/bulimia; Confinement Anxiety HBO Extended History Items Eyes: Cataracts Immunizations Pneumococcal Vaccine: Received Pneumococcal Vaccination: No Implantable Devices Family and Social History Cancer: Yes - Mother,Father; Diabetes: Yes - Mother; Hypertension: Yes - Father; Kidney Disease: No; Lung Disease: No; Seizures: No; Stroke: No; Thyroid Problems: No; Tuberculosis: No; Current every day smoker - 15 years; Marital Status - Fosdick, Josuel T. ([id number]) Single; Alcohol Use: Moderate; Drug Use: No History; Caffeine Use: Never; Advanced Directives: No; Patient does not want information on Advanced Directives; Do not resuscitate: No; Living Will: No; Medical Power of Attorney: No Physician Affirmation I have reviewed and agree with the above information. Electronic Signature(s) Signed: 02/23/2018 5:23:14 PM By: Montey Hora Signed: 02/23/2018 8:01:46 PM By: Worthy Keeler PA-C Entered By: Worthy Keeler on 02/23/2018 08:43:42 Dolinar, Carin Hock  ([id number]) -------------------------------------------------------------------------------- SuperBill Details Patient Name: [name] Date of Service: 02/23/2018 Medical Record Number: [id number] Patient Account Number: [id number] Date of Birth/Sex: Jun 19, 1965 (52 y.o. M) Treating RN: Montey Hora Primary Care Provider: SYSTEM, PCP Other Clinician: Referring Provider: Referral, Self Treating Provider/Extender: STONE III, Kellis Mcadam Weeks in Treatment: 8 Diagnosis Coding ICD-10 Codes Code Description G90.09 Other idiopathic peripheral autonomic neuropathy L97.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 of other part of left foot with fat layer exposed I10 Essential (primary) hypertension G47.30 Sleep apnea, unspecified Facility Procedures CPT4 Code: 82505397 Description: 67341 - DEB SUBQ TISSUE 20 SQ CM/< ICD-10 Diagnosis Description L97.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 of other part of left foot with fat Modifier: layer exposed Quantity: 1 Physician Procedures CPT4 Code: 9379024 Description: 11042 - WC PHYS SUBQ TISS 20 SQ CM ICD-10 Diagnosis Description L97.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 of other part of left foot with fat Modifier: layer exposed Quantity: 1 Electronic Signature(s) Signed: 02/23/2018 8:01:46 PM By: Worthy Keeler PA-C Entered By: Worthy Keeler on 02/23/2018 08:44:52

## 2018-03-02 ENCOUNTER — Ambulatory Visit: Payer: Medicaid Other | Admitting: Physician Assistant

## 2018-03-02 ENCOUNTER — Encounter: Payer: Medicaid Other | Admitting: Physician Assistant

## 2018-03-02 DIAGNOSIS — E11621 Type 2 diabetes mellitus with foot ulcer: Secondary | ICD-10-CM | POA: Diagnosis not present

## 2018-03-04 NOTE — Progress Notes (Signed)
MARKALE, BIRDSELL ([id number]) Visit Report for 03/02/2018 Chief Complaint Document Details Patient Name: [name], [name] Date of Service: 03/02/2018 8:15 AM Medical Record Number: [id number] Patient Account Number: [id number] Date of Birth/Sex: Apr 22, 1965 (52 y.o. M) Treating RN: Montey Hora Primary Care Provider: SYSTEM, PCP Other Clinician: Referring Provider: Referral, Self Treating Provider/Extender: Melburn Hake, HOYT Weeks in Treatment: 9 Information Obtained from: Patient Chief Complaint Left 1st toe ulcer Electronic Signature(s) Signed: 03/03/2018 12:35:32 AM By: Worthy Keeler PA-C Entered By: Worthy Keeler on 03/02/2018 08:28:04 Lewey, [name] ([id number]) -------------------------------------------------------------------------------- HPI Details Patient Name: [name] Date of Service: 03/02/2018 8:15 AM Medical Record Number: [id number] Patient Account Number: [id number] Date of Birth/Sex: [birth year] (52 y.o. M) Treating RN: Montey Hora Primary Care Provider: SYSTEM, PCP Other Clinician: Referring Provider: Referral, Self Treating Provider/Extender: STONE III, HOYT Weeks in Treatment: 9 History of Present Illness HPI Description: 12/29/17 on evaluation today patient presents for an injury due to a trauma on the left great toe plantar aspect. 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 We did review his notes and records as well and there appears to be no evidence of a formal diagnosis of diabetes. 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 At this point the patient has no pain in regard to the wound on his left great toe. In general he seems to be doing excellent in this regard. Nonetheless he does have a wound with callous surrounding there appears to be some  new epithelialization but in general the healing seems to be very slow according to the patient. He was seen in the ER at [facility] on 11/24/17 where he was prescribed Bactrim along with Keflex for infection. That has been completed at this point. No fevers, chills, nausea, or vomiting noted at this time. 01/05/18 on evaluation today patient actually appears to be doing rather well in regard to the left great toe ulcer. He has been tolerating the dressing changes without complication. Fortunately there does not appear to be any evidence of infection at this time. No fevers, chills, nausea, or vomiting noted at this time. I did review patient's x-ray which she did have in the interim since I last saw him. This was negative for any signs of acute osteomyelitis or bony injury. 01/19/18 upon evaluation today patient actually appears to be doing very well in regard to his toe ulcer. The show signs of some good epithelialization he does have some callous noted on the plantar aspect of the wound location but again this is minimal compared to previous. Overall I feel like he's making good progress. Fortunately there does not appear to be any evidence of infection at this time he's wearing the offloading shoe and from the standpoint of the overall appearance of the wound I would say he's wearing this on a regular basis. Fortunately his x-ray did come back negative for any evidence of a bone abnormality. 02/02/18 on evaluation today patient actually appears to be doing better in regard to his foot ulcer. He continues to show signs of improvement. I do believe that our offloading measures have been for the most part successful. Overall I'm very pleased in this regard. No fevers, chills, nausea, or vomiting noted at this time. 02/16/18 on evaluation today patient appears to actually be doing rather  well in regard to the overall appearance of his toe ulcer. He does have some callous on the wound bed which did require  sharp debridement today. There was some Slough noted on the surface as well. No fevers, chills, nausea, or vomiting noted at this time. 02/23/18 on evaluation today patient appears to be doing rather well in regard to his toe ulcer. He definitely seems to be doing what we've recommended an overall things seem to have healed quite nicely. It is not completely closed yet but he is definitely on the right track and I feel it will close over the next 1 to 2 weeks potentially. 03/02/18 on evaluation today patient appears to be doing very well in regard to his great toe ulcer. In fact this appears to be completely healed. He states he really has not had any drainage from the site at this point. No fevers, chills, nausea, or vomiting noted at this time. Electronic Signature(s) Signed: 03/03/2018 12:35:32 AM By: Worthy Keeler PA-C Entered By: Worthy Keeler on 03/02/2018 08:54:42 [name], [name] ([id number]) -------------------------------------------------------------------------------- Physical Exam Details Patient Name: [name] Date of Service: 03/02/2018 8:15 AM Medical Record Number: [id number] Patient Account Number: [id number] Date of Birth/Sex: [birth year] (52 y.o. M) Treating RN: Montey Hora Primary Care Provider: SYSTEM, PCP Other Clinician: Referring Provider: Referral, Self Treating Provider/Extender: STONE III, HOYT Weeks in Treatment: 9 Constitutional Well-nourished and well-hydrated in no acute distress. Respiratory normal breathing without difficulty. Psychiatric this patient is able to make decisions and demonstrates good insight into disease process. Alert and Oriented x 3. pleasant and cooperative. Notes On evaluation today the patient actually appears to be doing excellent in regard to the toe ulcer and again he shows what appears to be complete epithelialization at this point. I'm very pleased in this regard. Electronic Signature(s) Signed: 03/03/2018  12:35:32 AM By: Worthy Keeler PA-C Entered By: Worthy Keeler on 03/02/2018 08:55:12 [name], [name] ([id number]) -------------------------------------------------------------------------------- Physician Orders Details Patient Name: [name] Date of Service: 03/02/2018 8:15 AM Medical Record Number: [id number] Patient Account Number: [id number] Date of Birth/Sex: [birth year] (52 y.o. 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 I10 Essential (primary) hypertension G47.30 Sleep apnea, unspecified Discharge From [facility] Services o Discharge from [place] Signature(s) Signed: 03/02/2018 5:12:41 PM By: Montey Hora Signed: 03/03/2018 12:35:32 AM By: Worthy Keeler PA-C Entered By: Montey Hora on 03/02/2018 08:37:22 Belcastro, [name] ([id number]) -------------------------------------------------------------------------------- Problem List Details Patient Name: [name] Date of Service: 03/02/2018 8:15 AM Medical Record Number: [id number] Patient Account Number: [id number] Date of Birth/Sex: 02-26-1966 (52 y.o. M) Treating RN: Montey Hora Primary Care Provider: SYSTEM, PCP Other Clinician: Referring Provider: Referral, Self Treating Provider/Extender: STONE III, HOYT Weeks in Treatment: 9 Active Problems ICD-10 Evaluated Encounter Code Description Active Date Today Diagnosis G90.09 Other idiopathic peripheral autonomic neuropathy 12/29/2017 No Yes L97.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 of other part of left foot with fat 12/29/2017 No Yes layer exposed [name] (primary)  hypertension 12/29/2017 No Yes G47.30 Sleep apnea, unspecified 12/29/2017 No Yes Inactive Problems Resolved Problems Electronic Signature(s) Signed: 03/03/2018 12:35:32 AM By: Worthy Keeler PA-C Entered By: Worthy Keeler on 03/02/2018 08:27:53 [name], [name] ([id number]) --------------------------------------------------------------------------------  Progress Note Details Patient Name: [name], [name] Date of Service: 03/02/2018 8:15 AM Medical Record Number: [id number] Patient Account Number: [id number] Date of Birth/Sex: 1965-12-15 (52 y.o. M) Treating RN: Montey Hora Primary Care Provider: SYSTEM, PCP Other Clinician: Referring Provider: Referral, Self Treating Provider/Extender: STONE III, HOYT Weeks in Treatment: 9 Subjective Chief Complaint Information obtained from Patient Left 1st toe ulcer History of Present Illness (HPI) 12/29/17 on evaluation today patient presents for an injury due to a trauma on the left great toe plantar aspect. 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 We did review his notes and records as well and there appears to be no evidence of a formal diagnosis of diabetes. 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 At this point the patient has no pain in regard to the wound on his left great toe. In general he seems to be doing excellent in this regard. Nonetheless he does have a wound with callous surrounding there appears to be some new epithelialization but in general the healing seems to be very slow according to the patient. He was seen in the ER at [facility] on 11/24/17 where he was prescribed Bactrim along with Keflex for infection. That has been completed at this point. No fevers, chills, nausea, or vomiting noted at this time. 01/05/18 on evaluation today  patient actually appears to be doing rather well in regard to the left great toe ulcer. He has been tolerating the dressing changes without complication. Fortunately there does not appear to be any evidence of infection at this time. No fevers, chills, nausea, or vomiting noted at this time. I did review patient's x-ray which she did have in the interim since I last saw him. This was negative for any signs of acute osteomyelitis or bony injury. 01/19/18 upon evaluation today patient actually appears to be doing very well in regard to his toe ulcer. The show signs of some good epithelialization he does have some callous noted on the plantar aspect of the wound location but again this is minimal compared to previous. Overall I feel like he's making good progress. Fortunately there does not appear to be any evidence of infection at this time he's wearing the offloading shoe and from the standpoint of the overall appearance of the wound I would say he's wearing this on a regular basis. Fortunately his x-ray did come back negative for any evidence of a bone abnormality. 02/02/18 on evaluation today patient actually appears to be doing better in regard to his foot ulcer. He continues to show signs of improvement. I do believe that our offloading measures have been for the most part successful. Overall I'm very pleased in this regard. No fevers, chills, nausea, or vomiting noted at this time. 02/16/18 on evaluation today patient appears to actually be doing rather well in regard to the overall appearance of his toe ulcer. He does have some callous on the wound bed which did require sharp debridement today. There was some Slough noted on the surface as well. No fevers, chills, nausea, or vomiting noted at this time. 02/23/18 on evaluation today patient appears to be doing rather well in regard to his toe ulcer. He definitely seems to be doing what we've recommended an overall things seem to have healed quite  nicely. It is not  completely closed yet but he is definitely on the right track and I feel it will close over the next 1 to 2 weeks potentially. 03/02/18 on evaluation today patient appears to be doing very well in regard to his great toe ulcer. In fact this appears to be completely healed. He states he really has not had any drainage from the site at this point. No fevers, chills, nausea, or vomiting noted at this time. Patient History Information obtained from Patient. [name], [name] ([id number]) Family History Cancer - Mother,Father, Diabetes - Mother, Hypertension - Father, No family history of Kidney Disease, Lung Disease, Seizures, Stroke, Thyroid Problems, Tuberculosis. Social History Current every day smoker - 15 years, Marital Status - Single, Alcohol Use - Moderate, Drug Use - No History, Caffeine Use - Never. Medical And Surgical History Notes Endocrine Borderline Review of Systems (ROS) Constitutional Symptoms (General Health) Denies complaints or symptoms of Fever, Chills. Respiratory The patient has no complaints or symptoms. Cardiovascular The patient has no complaints or symptoms. Psychiatric The patient has no complaints or symptoms. Objective Constitutional Well-nourished and well-hydrated in no acute distress. Vitals Time Taken: 8:13 AM, Height: 74 in, Weight: 252 lbs, BMI: 32.4, Temperature: 97.6 F, Pulse: 56 bpm, Respiratory Rate: 16 breaths/min, Blood Pressure: 137/90 mmHg. Respiratory normal breathing without difficulty. Psychiatric this patient is able to make decisions and demonstrates good insight into disease process. Alert and Oriented x 3. pleasant and cooperative. General Notes: On evaluation today the patient actually appears to be doing excellent in regard to the toe ulcer and again he shows what appears to be complete epithelialization at this point. I'm very pleased in this regard. Integumentary (Hair, Skin) Wound #1 status is Healed -  Epithelialized. Original cause of wound was Gradually Appeared. The wound is located on the [employer]. The wound measures 0cm length x 0cm width x 0cm depth; 0cm^2 area and 0cm^3 volume. There is Fat Layer (Subcutaneous Tissue) Exposed exposed. There is a small amount of serous drainage noted. The wound margin is flat and intact. There is no granulation within the wound bed. There is no necrotic tissue within the wound bed. The periwound skin appearance exhibited: Callus, Maceration. The periwound skin appearance did not exhibit: Crepitus, Excoriation, Induration, Rash, Scarring, Dry/Scaly, Atrophie Blanche, Cyanosis, Ecchymosis, Hemosiderin Staining, Mottled, Pallor, [place], Teandre T. ([id number]) Rubor, Erythema. Periwound temperature was noted as No Abnormality. The periwound has tenderness on palpation. 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 Also would like to get them into a podiatrist to see about potentially getting a two brace possibly a gel type in order to help keep the toe from rubbing. I think this will go a long way in preventing this from reoccurring. He is in agreement this plan will make the referral to podiatry for him. If he does not hear from them in a week I told him to call us in order to see were things stand. Electronic Signature(s) Signed: 03/03/2018 12:35:32 AM By: Worthy Keeler PA-C Entered By: Worthy Keeler on 03/02/2018 08:55:56 [name], [name]  ([id number]) --------------------------------------------------------------------------------  ROS/PFSH Details Patient Name: KOICHI, PLATTE Date of Service: 03/02/2018 8:15 AM Medical Record Number: [id number] Patient Account Number: [id number] Date of Birth/Sex: 19-Oct-1965 (52 y.o. M) Treating RN: Montey Hora Primary Care Provider: SYSTEM, PCP Other Clinician: Referring Provider: Referral, Self Treating Provider/Extender: STONE III, HOYT Weeks in Treatment: 9 Information Obtained From Patient Wound History Do you currently have one or more open woundso Yes How many open wounds do you currently haveo 1 Approximately how long have you had your woundso 2 months How have you been treating your wound(s) until nowo neosporin Has your wound(s) ever healed and then re-openedo No Have you had any lab work done in the past montho Yes Who ordered the lab work doneo Dr. Ivar Bury Have you tested positive for an antibiotic resistant organism (MRSA, VRE)o No Have you tested positive for osteomyelitis (bone infection)o No Have you had any tests for circulation on your legso No Constitutional Symptoms (General Health) Complaints and Symptoms: Negative for: Fever; Chills Eyes Medical History: Positive for: Cataracts - Left Negative for: Glaucoma; Optic Neuritis Ear/Nose/Mouth/Throat Medical History: Negative for: Chronic sinus problems/congestion; Middle ear problems Hematologic/Lymphatic Medical History: Negative for: Anemia; Hemophilia; Human Immunodeficiency Virus; Sickle Cell Disease Respiratory Complaints and Symptoms: No Complaints or Symptoms Medical History: Positive for: Asthma; Chronic Obstructive Pulmonary Disease (COPD); Sleep Apnea - C-pap Negative for: Aspiration; Pneumothorax; Tuberculosis Cardiovascular Complaints and Symptoms: No Complaints or Symptoms [name], [name] T. ([id number]) Medical History: Negative for: Angina; Arrhythmia; Congestive Heart Failure; Coronary  Artery Disease; Deep Vein Thrombosis; Hypertension; Hypotension; Myocardial Infarction; Peripheral Arterial Disease; Peripheral Venous Disease; Phlebitis; Vasculitis Gastrointestinal Medical History: Negative for: Cirrhosis ; Colitis; Crohnos; Hepatitis A; Hepatitis B; Hepatitis C Endocrine Medical History: Negative for: Type I Diabetes; Type II Diabetes Past Medical History Notes: Borderline Integumentary (Skin) Medical History: Negative for: History of Burn; History of pressure wounds Musculoskeletal Medical History: Negative for: Gout; Rheumatoid Arthritis; Osteoarthritis; Osteomyelitis Neurologic Medical History: Positive for: Neuropathy - Feet Negative for: Dementia; Quadriplegia; Paraplegia; Seizure Disorder Oncologic Medical History: Negative for: Received Chemotherapy; Received Radiation Psychiatric Complaints and Symptoms: No Complaints or Symptoms Medical History: Negative for: Anorexia/bulimia; Confinement Anxiety HBO Extended History Items Eyes: Cataracts Immunizations Pneumococcal Vaccine: Received Pneumococcal Vaccination: No Implantable Devices Family and Social History Cancer: Yes - Mother,Father; Diabetes: Yes - Mother; Hypertension: Yes - Father; Kidney Disease: No; Lung Disease: No; Seizures: No; Stroke: No; Thyroid Problems: No; Tuberculosis: No; Current every day smoker - 15 years; Marital Status - [name], [name] T. ([id number]) Single; Alcohol Use: Moderate; Drug Use: No History; Caffeine Use: Never; Advanced Directives: No; Patient does not want information on Advanced Directives; Do not resuscitate: No; Living Will: No; Medical Power of Attorney: No Physician Affirmation I have reviewed and agree with the above information. Electronic Signature(s) Signed: 03/02/2018 5:12:41 PM By: Montey Hora Signed: 03/03/2018 12:35:32 AM By: Worthy Keeler PA-C Entered By: Worthy Keeler on 03/02/2018 08:54:57 [name], [name]  ([id number]) -------------------------------------------------------------------------------- SuperBill Details Patient Name: [name] Date of Service: 03/02/2018 Medical Record Number: [id number] Patient Account Number: [id number] Date of Birth/Sex: 1965-11-28 (52 y.o. 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 I10 Essential (primary) hypertension G47.30 Sleep apnea, unspecified Facility Procedures CPT4 Code: [id number] Description: 06301 - WOUND CARE VISIT-LEV 2 EST  PT Modifier: Quantity: 1 Physician Procedures CPT4 Code: [id number] Description: 30092 - WC PHYS LEVEL 3 - EST PT ICD-10 Diagnosis Description G90.09 Other idiopathic peripheral 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

## 2018-03-07 NOTE — Progress Notes (Signed)
[name], [name] ([id number]) Visit Report for 03/02/2018 Arrival Information Details Patient Name: [name], [name] Date of Service: 03/02/2018 8:15 AM Medical Record Number: [id number] Patient Account Number: [id number] Date of Birth/Sex: 04-25-1965 (52 y.o. M) Treating RN: Montey Hora Primary Care Ambriel Gorelick: SYSTEM, PCP Other Clinician: Referring Airabella Barley: Referral, Self Treating Labrandon Knoch/Extender: STONE III, HOYT Weeks in Treatment: 9 Visit Information History Since Last Visit Added or deleted any medications: No Patient Arrived: Ambulatory Any new allergies or adverse reactions: No Arrival Time: 08:12 Had a fall or experienced change in No Accompanied By: self activities of daily living that may affect Transfer Assistance: None risk of falls: Patient Identification Verified: Yes Signs or symptoms of abuse/neglect since last visito No Secondary Verification Process Yes Hospitalized since last visit: No Completed: Implantable device outside of the clinic excluding No Patient Has Alerts: Yes cellular tissue based products placed in the center Patient Alerts: Borderline since last visit: Diabetic Has Dressing in Place as Prescribed: No Pain Present Now: No Electronic Signature(s) Signed: 03/02/2018 11:54:57 AM By: Lorine Bears RCP, RRT, CHT Entered By: Lorine Bears on 03/02/2018 08:12:49 [name], [name] ([id number]) -------------------------------------------------------------------------------- Clinic Level of Care Assessment Details Patient Name: [name] Date of Service: 03/02/2018 8:15 AM Medical Record Number: [id number] Patient Account Number: [id number] Date of Birth/Sex: 09-Apr-1965 (52 y.o. M) Treating RN: Montey Hora Primary Care Reika Callanan: SYSTEM, PCP Other Clinician: Referring Marlen Mollica: Referral, Self Treating Burma Ketcher/Extender: STONE III, HOYT Weeks in Treatment: 9 Clinic Level of Care Assessment Items TOOL 4  Quantity Score []  - Use when only an EandM is performed on FOLLOW-UP visit 0 ASSESSMENTS - Nursing Assessment / Reassessment X - Reassessment of Co-morbidities (includes updates in patient status) 1 10 X- 1 5 Reassessment of Adherence to Treatment Plan ASSESSMENTS - Wound and Skin Assessment / Reassessment X - Simple Wound Assessment / Reassessment - one wound 1 5 []  - 0 Complex Wound Assessment / Reassessment - multiple wounds []  - 0 Dermatologic / Skin Assessment (not related to wound area) ASSESSMENTS - Focused Assessment []  - Circumferential Edema Measurements - multi extremities 0 []  - 0 Nutritional Assessment / Counseling / Intervention X- 1 5 Lower Extremity Assessment (monofilament, tuning fork, pulses) []  - 0 Peripheral Arterial Disease Assessment (using hand held doppler) ASSESSMENTS - Ostomy and/or Continence Assessment and Care []  - Incontinence Assessment and Management 0 []  - 0 Ostomy Care Assessment and Management (repouching, etc.) PROCESS - Coordination of Care X - Simple Patient / Family Education for ongoing care 1 15 []  - 0 Complex (extensive) Patient / Family Education for ongoing care []  - 0 Staff obtains Programmer, systems, Records, Test Results / Process Orders []  - 0 Staff telephones HHA, Nursing Homes / Clarify orders / etc []  - 0 Routine Transfer to another Facility (non-emergent condition) []  - 0 Routine Hospital Admission (non-emergent condition) []  - 0 New Admissions / Biomedical engineer / Ordering NPWT, Apligraf, etc. []  - 0 Emergency Hospital Admission (emergent condition) X- 1 10 Simple Discharge Coordination [name], [name] T. ([id number]) []  - 0 Complex (extensive) Discharge Coordination PROCESS - Special Needs []  - Pediatric / Minor Patient Management 0 []  - 0 Isolation Patient Management []  - 0 Hearing / Language / Visual special needs []  - 0 Assessment of Community assistance (transportation, D/C planning, etc.) []  - 0 Additional  assistance / Altered mentation []  - 0 Support Surface(s) Assessment (bed, cushion, seat, etc.) INTERVENTIONS - Wound Cleansing / Measurement X - Simple Wound Cleansing - one wound 1 5 []  -  0 Complex Wound Cleansing - multiple wounds X- 1 5 Wound Imaging (photographs - any number of wounds) []  - 0 Wound Tracing (instead of photographs) X- 1 5 Simple Wound Measurement - one wound []  - 0 Complex Wound Measurement - multiple wounds INTERVENTIONS - Wound Dressings []  - Small Wound Dressing one or multiple wounds 0 []  - 0 Medium Wound Dressing one or multiple wounds []  - 0 Large Wound Dressing one or multiple wounds []  - 0 Application of Medications - topical []  - 0 Application of Medications - injection INTERVENTIONS - Miscellaneous []  - External ear exam 0 []  - 0 Specimen Collection (cultures, biopsies, blood, body fluids, etc.) []  - 0 Specimen(s) / Culture(s) sent or taken to Lab for analysis []  - 0 Patient Transfer (multiple staff / Civil Service fast streamer / Similar devices) []  - 0 Simple Staple / Suture removal (25 or less) []  - 0 Complex Staple / Suture removal (26 or more) []  - 0 Hypo / Hyperglycemic Management (close monitor of Blood Glucose) []  - 0 Ankle / Brachial Index (ABI) - do not check if billed separately X- 1 5 Vital Signs [name], [name] T. ([id number]) Has the patient been seen at the hospital within the last three years: Yes Total Score: 70 Level Of Care: New/Established - Level 2 Electronic Signature(s) Signed: 03/02/2018 5:12:41 PM By: Montey Hora Entered By: Montey Hora on 03/02/2018 08:43:48 [name], [name] ([id number]) -------------------------------------------------------------------------------- Encounter Discharge Information Details Patient Name: [name] Date of Service: 03/02/2018 8:15 AM Medical Record Number: [id number] Patient Account Number: [id number] Date of Birth/Sex: 08-30-1965 (52 y.o. M) Treating RN: Montey Hora Primary  Care Natalynn Pedone: SYSTEM, PCP Other Clinician: Referring Veronia Laprise: Referral, Self Treating Yetta Marceaux/Extender: STONE III, HOYT Weeks in Treatment: 9 Encounter Discharge Information Items Discharge Condition: Stable Ambulatory Status: Ambulatory Discharge Destination: Home Transportation: Private Auto Accompanied By: self Schedule Follow-up Appointment: No Clinical Summary of Care: Electronic Signature(s) Signed: 03/02/2018 5:12:41 PM By: Montey Hora Entered By: Montey Hora on 03/02/2018 08:44:33 [name], [name] ([id number]) -------------------------------------------------------------------------------- Lower Extremity Assessment Details Patient Name: [name] Date of Service: 03/02/2018 8:15 AM Medical Record Number: [id number] Patient Account Number: [id number] Date of Birth/Sex: May 12, 1965 (52 y.o. M) Treating RN: Harold Barban Primary Care Rook Maue: SYSTEM, PCP Other Clinician: Referring Cannon Arreola: Referral, Self Treating Remijio Holleran/Extender: STONE III, HOYT Weeks in Treatment: 9 Electronic Signature(s) Signed: 03/06/2018 1:37:43 PM By: Harold Barban Entered By: Harold Barban on 03/02/2018 08:25:47 [name], [name] ([id number]) -------------------------------------------------------------------------------- [place] Details Patient Name: [name] Date of Service: 03/02/2018 8:15 AM Medical Record Number: [id number] Patient Account Number: [id number] Date of Birth/Sex: 1965/08/09 (52 y.o. M) Treating RN: Montey Hora Primary Care Alishah Schulte: SYSTEM, PCP Other Clinician: Referring Killian Ress: Referral, Self Treating Ladasha Schnackenberg/Extender: STONE III, HOYT Weeks in Treatment: 9 Active Inactive Electronic Signature(s) Signed: 03/02/2018 5:12:41 PM By: Montey Hora Entered By: Montey Hora on 03/02/2018 08:35:31 [name], [name] ([id number]) -------------------------------------------------------------------------------- Pain  Assessment Details Patient Name: [name] Date of Service: 03/02/2018 8:15 AM Medical Record Number: [id number] Patient Account Number: [id number] Date of Birth/Sex: [birth year] (52 y.o. M) Treating RN: Montey Hora Primary Care Saahas Hidrogo: SYSTEM, PCP Other Clinician: Referring Ariana Cavenaugh: Referral, Self Treating Tron Flythe/Extender: STONE III, HOYT Weeks in Treatment: 9 Active Problems Location of Pain Severity and Description of Pain Patient Has Paino No Site Locations Pain Management and Medication Current Pain Management: Electronic Signature(s) Signed: 03/02/2018 11:54:57 AM By: Paulla Fore, RRT, CHT Signed: 03/02/2018 5:12:41 PM By: Montey Hora Entered By: Lorine Bears  on 03/02/2018 08:12:57 HENERY, BETZOLD ([id number]) -------------------------------------------------------------------------------- Patient/Caregiver Education Details Patient Name: [name] Date of Service: 03/02/2018 8:15 AM Medical Record Number: [id number] Patient Account Number: [id number] Date of Birth/Gender: 1965-09-20 (52 y.o. M) Treating RN: Montey Hora Primary Care Physician: SYSTEM, PCP Other Clinician: Referring Physician: Referral, Self Treating Physician/Extender: Melburn Hake, HOYT Weeks in Treatment: 9 Education Assessment Education Provided To: Patient Education Topics Provided Basic Hygiene: Handouts: Other: care of newly healed ulcer site Methods: Explain/Verbal Responses: State content correctly Electronic Signature(s) Signed: 03/02/2018 5:12:41 PM By: Montey Hora Entered By: Montey Hora on 03/02/2018 08:44:15 [name], [name] ([id number]) -------------------------------------------------------------------------------- Wound Assessment Details Patient Name: [name] Date of Service: 03/02/2018 8:15 AM Medical Record Number: [id number] Patient Account Number: [id number] Date of Birth/Sex: [birth year] (52 y.o.  M) Treating RN: Montey Hora Primary Care Edd Reppert: SYSTEM, PCP Other Clinician: Referring Andrey Mccaskill: Referral, Self Treating Lindsy Cerullo/Extender: STONE III, HOYT Weeks in Treatment: 9 Wound Status Wound Number: 1 Primary Neuropathic Ulcer-Non Diabetic Etiology: Wound Location: Left, Plantar Toe Great Wound Healed - Epithelialized Wounding Event: Gradually Appeared Status: Date Acquired: 10/12/2017 Comorbid Cataracts, Asthma, Chronic Obstructive Weeks Of Treatment: 9 History: Pulmonary Disease (COPD), Sleep Apnea, Clustered Wound: No Neuropathy Wound Measurements Length: (cm) Width: (cm) Depth: (cm) Area: (cm) Volume: (cm) 0 % Reduction in Area: 100% 0 % Reduction in Volume: 100% 0 Epithelialization: None 0 0 Wound Description Full Thickness Without Exposed Support Classification: Structures Wound Margin: Flat and Intact Exudate Small Amount: Exudate Type: Serous Exudate Color: amber Foul Odor After Cleansing: No Slough/Fibrino Yes Wound Bed Granulation Amount: None Present (0%) Exposed Structure Necrotic Amount: None Present (0%) Fascia Exposed: No Fat Layer (Subcutaneous Tissue) Exposed: Yes Tendon Exposed: No Muscle Exposed: No Joint Exposed: No Bone Exposed: No Periwound Skin Texture Texture Color No Abnormalities Noted: No No Abnormalities Noted: No Callus: Yes Atrophie Blanche: No Crepitus: No Cyanosis: No Excoriation: No Ecchymosis: No Induration: No Erythema: No Rash: No Hemosiderin Staining: No Scarring: No Mottled: No Pallor: No Moisture Rubor: No No Abnormalities Noted: No [name], [name] T. ([id number]) Dry / Scaly: No Temperature / Pain Maceration: Yes Temperature: No Abnormality Tenderness on Palpation: Yes Wound Preparation Ulcer Cleansing: Rinsed/Irrigated with Saline Topical Anesthetic Applied: Other: lidocaine 4%, Electronic Signature(s) Signed: 03/02/2018 5:12:41 PM By: Montey Hora Entered By: Montey Hora on  03/02/2018 08:34:14 [name], [name] ([id number]) -------------------------------------------------------------------------------- Vitals Details Patient Name: [name] Date of Service: 03/02/2018 8:15 AM Medical Record Number: [id number] Patient Account Number: [id number] Date of Birth/Sex: [birth year] (52 y.o. M) Treating RN: Montey Hora Primary Care Durrel Mcnee: SYSTEM, PCP Other Clinician: Referring Francille Wittmann: Referral, Self Treating Renly Roots/Extender: STONE III, HOYT Weeks in Treatment: 9 Vital Signs Time Taken: 08:13 Temperature (F): 97.6 Height (in): 74 Pulse (bpm): 56 Weight (lbs): 252 Respiratory Rate (breaths/min): 16 Body Mass Index (BMI): 32.4 Blood Pressure (mmHg): 137/90 Reference Range: 80 - 120 mg / dl Electronic Signature(s) Signed: 03/02/2018 11:54:57 AM By: Lorine Bears RCP, RRT, CHT Entered By: Becky Sax, Amado Nash on 03/02/2018 08:16:02

## 2018-03-21 ENCOUNTER — Ambulatory Visit: Payer: Medicaid Other

## 2018-03-21 ENCOUNTER — Ambulatory Visit (INDEPENDENT_AMBULATORY_CARE_PROVIDER_SITE_OTHER): Payer: Medicaid Other

## 2018-03-21 ENCOUNTER — Encounter: Payer: Self-pay | Admitting: Podiatry

## 2018-03-21 ENCOUNTER — Ambulatory Visit: Payer: Medicaid Other | Admitting: Podiatry

## 2018-03-21 DIAGNOSIS — L97521 Non-pressure chronic ulcer of other part of left foot limited to breakdown of skin: Secondary | ICD-10-CM | POA: Diagnosis not present

## 2018-03-21 DIAGNOSIS — E119 Type 2 diabetes mellitus without complications: Secondary | ICD-10-CM | POA: Diagnosis not present

## 2018-03-21 NOTE — Progress Notes (Signed)
He presents today for follow-up of the ulceration left hallux.  He was sent over from wound care.  He has a history of diabetes and trauma to the hallux.  States that they are really not getting anywhere with the wound.  Is been this way for the past 4 months.  He denies fever chills nausea vomiting muscle aches pains calf pain back pain chest pain shortness of breath.  States that he is completely neuropathic.  States his last hemoglobin A1c was at 6.0.  Review of systems he denies fever chills nausea muscle aches pains calf pain back pain chest pain shortness of breath.  Objective: Vital signs are stable alert and oriented x3 pulses are strong palpable.  He has a flail hallux left with strong plantar flexion.  No extension whatsoever.  This is resulted in a skin breakdown distally.  It is also demonstrated a dissecting abscess.  I was able to resect this in total today this does not probe to bone and radiographs do not demonstrate any signs of osteomyelitis.  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  Plan: Discussed etiology pathology conservative surgical therapies at this point in time placed collagen therapy on the wound he will do this every day he already has it at home.  I showed him how to buffer the toe so the tip of the toe will rub in his shoe.  He will continue the Darco shoe and he will follow-up with Dr. Amalia Hailey.  More than likely this will require a hallux interphalangeal joint fusion once the wound is healed.

## 2018-04-10 ENCOUNTER — Encounter: Payer: Self-pay | Admitting: Podiatry

## 2018-04-10 ENCOUNTER — Ambulatory Visit: Payer: Medicaid Other | Admitting: Podiatry

## 2018-04-10 DIAGNOSIS — I70245 Atherosclerosis of native arteries of left leg with ulceration of other part of foot: Secondary | ICD-10-CM

## 2018-04-10 DIAGNOSIS — E0843 Diabetes mellitus due to underlying condition with diabetic autonomic (poly)neuropathy: Secondary | ICD-10-CM

## 2018-04-10 DIAGNOSIS — L97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 of other part of left foot with fat layer exposed: Secondary | ICD-10-CM

## 2018-04-17 NOTE — Progress Notes (Signed)
   Subjective:  53 year old male presenting today for follow up evaluation of an ulceration of the left hallux. He states the wound is improving. He has been using Prisma with a dry sterile dressing daily as directed. There are no modifying factors noted. Patient is here for further evaluation and treatment.   No past medical history on file.    Objective/Physical Exam General: The patient is alert and oriented x3 in no acute distress.  Dermatology:  Wound #1 noted to the left hallux measuring 0.3 x 0.2 x 0.1 cm (LxWxD).   To the noted ulceration(s), there is no eschar. There is a moderate amount of slough, fibrin, and necrotic tissue noted. Granulation tissue and wound base is red. There is a minimal amount of serosanguineous drainage noted. There is no exposed bone muscle-tendon ligament or joint. There is no malodor. Periwound integrity is intact. Skin is warm, dry and supple bilateral lower extremities.  Vascular: Palpable pedal pulses bilaterally. No edema or erythema noted. Capillary refill within normal limits.  Neurological: Epicritic and protective threshold diminished bilaterally.   Musculoskeletal Exam: Range of motion within normal limits to all pedal and ankle joints bilateral. Muscle strength 5/5 in all groups bilateral.   Assessment: #1 ulceration of the left hallux secondary to diabetes mellitus #2 diabetes mellitus w/ peripheral neuropathy   Plan of Care:  #1 Patient was evaluated. #2 medically necessary excisional debridement including subcutaneous tissue was performed using a tissue nipper and a chisel blade. Excisional debridement of all the necrotic nonviable tissue down to healthy bleeding viable tissue was performed with post-debridement measurements same as pre-. #3 the wound was cleansed and dry sterile dressing applied. #4 Continue using Prisma daily with a dry sterile dressing.  #5 patient is to return to clinic in 3 weeks.   [name], DPM [facility]  Dr. [name], [address]                                        [place], [place] [address]                Office [phone]  Fax [phone]

## 2018-05-01 ENCOUNTER — Encounter: Payer: Self-pay | Admitting: Podiatry

## 2018-05-01 ENCOUNTER — Ambulatory Visit: Payer: Medicaid Other | Admitting: Podiatry

## 2018-05-01 DIAGNOSIS — E0843 Diabetes mellitus due to underlying condition with diabetic autonomic (poly)neuropathy: Secondary | ICD-10-CM

## 2018-05-01 DIAGNOSIS — L97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 of other part of left foot with fat layer exposed: Secondary | ICD-10-CM | POA: Diagnosis not present

## 2018-05-01 DIAGNOSIS — I70245 Atherosclerosis of native arteries of left leg with ulceration of other part of foot: Secondary | ICD-10-CM

## 2018-05-03 NOTE — Progress Notes (Signed)
   Subjective:  53 year old male presenting today for follow up evaluation of an ulceration of the left hallux. He states he has improved and the wound is almost healed. He has been using Prisma as directed and denies any modifying factors. Patient is here for further evaluation and treatment.   No past medical history on file.    Objective/Physical Exam General: The patient is alert and oriented x3 in no acute distress.  Dermatology: Wound noted to the left hallux has healed. Complete re-epithelialization has occurred. No drainage noted.   Vascular: Palpable pedal pulses bilaterally. No edema or erythema noted. Capillary refill within normal limits.  Neurological: Epicritic and protective threshold diminished bilaterally.   Musculoskeletal Exam: Mallet toe noted to the left hallux. Range of motion within normal limits to all pedal and ankle joints bilateral. Muscle strength 5/5 in all groups bilateral.   Assessment: #1 ulceration of the left hallux secondary to diabetes mellitus - healed  #2 diabetes mellitus w/ peripheral neuropathy #3 Mallet toe left hallux    Plan of Care:  #1 Patient was evaluated. #2 Light debridement of the left hallux ulcer performed with a tissue nipper.  #3 Appointment with Liliane Channel, Pedorthist, for DM shoes and insoles. #4 Return to clinic as needed.      [name], DPM [facility]  Dr. [name], [address]                                        [place], [place] [address]                Office [phone]  Fax [phone]

## 2018-05-08 ENCOUNTER — Ambulatory Visit: Payer: Medicaid Other | Admitting: Podiatry

## 2018-05-08 DIAGNOSIS — B351 Tinea unguium: Secondary | ICD-10-CM

## 2018-05-08 DIAGNOSIS — M79676 Pain in unspecified toe(s): Secondary | ICD-10-CM

## 2018-05-08 DIAGNOSIS — E0843 Diabetes mellitus due to underlying condition with diabetic autonomic (poly)neuropathy: Secondary | ICD-10-CM

## 2018-05-11 NOTE — Progress Notes (Signed)
   SUBJECTIVE Patient with a history of diabetes mellitus presents to office today complaining of elongated, thickened nails that cause pain while ambulating in shoes. He is unable to trim his own nails. He is concerned about the left great toe swelling and turning red. There are no modifying factors noted and he has not done anything for treatment. Patient is here for further evaluation and treatment.   No past medical history on file.  OBJECTIVE General Patient is awake, alert, and oriented x 3 and in no acute distress. Derm No erythema or swelling noted to the left hallux Skin is dry and supple bilateral. Negative open lesions or macerations. Remaining integument unremarkable. Nails are tender, long, thickened and dystrophic with subungual debris, consistent with onychomycosis, 1-5 bilateral. No signs of infection noted. Vasc  DP and PT pedal pulses palpable bilaterally. Temperature gradient within normal limits.  Neuro Epicritic and protective threshold sensation diminished bilaterally.  Musculoskeletal Exam No symptomatic pedal deformities noted bilateral. Muscular strength within normal limits.  ASSESSMENT 1. Diabetes Mellitus w/ peripheral neuropathy 2. Onychomycosis of nail due to dermatophyte bilateral 3. Pain in foot bilateral  PLAN OF CARE 1. Patient evaluated today. 2. Instructed to maintain good pedal hygiene and foot care. Stressed importance of controlling blood sugar.  3. Mechanical debridement of nails 1-5 bilaterally performed using a nail nipper. Filed with dremel without incident.  4. Return to clinic in 3 mos.     [name], DPM [facility]  Dr. [name], [address]                                        [place], [place] [address]                Office [phone]  Fax [phone]

## 2018-05-23 ENCOUNTER — Ambulatory Visit: Payer: Medicaid Other | Admitting: Orthotics

## 2018-05-23 ENCOUNTER — Other Ambulatory Visit: Payer: Self-pay

## 2018-05-23 DIAGNOSIS — L97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 of other part of left foot with fat layer exposed: Secondary | ICD-10-CM

## 2018-05-23 DIAGNOSIS — E0843 Diabetes mellitus due to underlying condition with diabetic autonomic (poly)neuropathy: Secondary | ICD-10-CM

## 2018-05-23 DIAGNOSIS — L97521 Non-pressure chronic ulcer of other part of left foot limited to breakdown of skin: Secondary | ICD-10-CM

## 2018-05-23 NOTE — Progress Notes (Signed)
Patient has [place] [employer] and is not eligible for diabetic shoes.  Patient does not want them.

## 2018-05-25 ENCOUNTER — Other Ambulatory Visit: Payer: Self-pay

## 2018-05-25 ENCOUNTER — Ambulatory Visit (INDEPENDENT_AMBULATORY_CARE_PROVIDER_SITE_OTHER): Payer: Medicaid Other | Admitting: Podiatry

## 2018-05-25 ENCOUNTER — Encounter: Payer: Self-pay | Admitting: Podiatry

## 2018-05-25 DIAGNOSIS — E0843 Diabetes mellitus due to underlying condition with diabetic autonomic (poly)neuropathy: Secondary | ICD-10-CM

## 2018-05-25 DIAGNOSIS — L97521 Non-pressure chronic ulcer of other part of left foot limited to breakdown of skin: Secondary | ICD-10-CM

## 2018-05-25 DIAGNOSIS — M205X2 Other deformities of toe(s) (acquired), left foot: Secondary | ICD-10-CM

## 2018-05-25 NOTE — Patient Instructions (Signed)
Pre-Operative Instructions  Congratulations, you have decided to take an important step towards improving your quality of life.  You can be assured that the doctors and staff at [facility] will be with you every step of the way.  Here are some important things you should know:  1. Plan to be at the surgery center/hospital at least 1 (one) hour prior to your scheduled time, unless otherwise directed by the surgical center/hospital staff.  You must have a responsible adult accompany you, remain during the surgery and drive you home.  Make sure you have directions to the surgical center/hospital to ensure you arrive on time. 2. If you are having surgery at [facility] or [facility] hospitals, you will need a copy of your medical history and physical form from your family physician within one month prior to the date of surgery. We will give you a form for your primary physician to complete.  3. We make every effort to accommodate the date you request for surgery.  However, there are times where surgery dates or times have to be moved.  We will contact you as soon as possible if a change in schedule is required.   4. No aspirin/ibuprofen for one week before surgery.  If you are on aspirin, any non-steroidal anti-inflammatory medications (Mobic, Aleve, Ibuprofen) should not be taken seven (7) days prior to your surgery.  You make take Tylenol for pain prior to surgery.  5. Medications - If you are taking daily heart and blood pressure medications, seizure, reflux, allergy, asthma, anxiety, pain or diabetes medications, make sure you notify the surgery center/hospital before the day of surgery so they can tell you which medications you should take or avoid the day of surgery. 6. No food or drink after midnight the night before surgery unless directed otherwise by surgical center/hospital staff. 7. No alcoholic beverages 24-hours prior to surgery.  No smoking 24-hours prior or 24-hours after  surgery. 8. Wear loose pants or shorts. They should be loose enough to fit over bandages, boots, and casts. 9. Don't wear slip-on shoes. Sneakers are preferred. 10. Bring your boot with you to the surgery center/hospital.  Also bring crutches or a walker if your physician has prescribed it for you.  If you do not have this equipment, it will be provided for you after surgery. 11. If you have not been contacted by the surgery center/hospital by the day before your surgery, call to confirm the date and time of your surgery. 12. Leave-time from work may vary depending on the type of surgery you have.  Appropriate arrangements should be made prior to surgery with your employer. 13. Prescriptions will be provided immediately following surgery by your doctor.  Fill these as soon as possible after surgery and take the medication as directed. Pain medications will not be refilled on weekends and must be approved by the doctor. 14. Remove nail polish on the operative foot and avoid getting pedicures prior to surgery. 15. Wash the night before surgery.  The night before surgery wash the foot and leg well with water and the antibacterial soap provided. Be sure to pay special attention to beneath the toenails and in between the toes.  Wash for at least three (3) minutes. Rinse thoroughly with water and dry well with a towel.  Perform this wash unless told not to do so by your physician.  Enclosed: 1 Ice pack (please put in freezer the night before surgery)   1 Hibiclens skin cleaner     Pre-op instructions  If you have any questions regarding the instructions, please do not hesitate to call our office.  [place]: [address], [place], [place] [address] -- [phone]  [place]: [address] [place], [place] [address] -- [phone]  : [address]  , [place] [address] -- [phone]  [place]: [address], [address], [place],  [address] -- [phone]  Website:  [email or web address] 

## 2018-05-25 NOTE — Progress Notes (Signed)
   HPI: 53 year old male presents the office today for reevaluation regarding a previous ulcer that he had on the distal tuft of the left hallux.  Ulcer is currently healed and callused over but he does continue to have some pain to the distal tuft of the toe secondary to contracture.  He presents for further treatment options.  History reviewed. No pertinent past medical history.   Physical Exam: General: The patient is alert and oriented x3 in no acute distress.  Dermatology: Skin is warm, dry and supple bilateral lower extremities. Negative for open lesions or macerations.  There is some hyperkeratotic callus tissue noted to the distal tuft of the left toe with tenderness to palpation  Vascular: Palpable pedal pulses bilaterally. No edema or erythema noted. Capillary refill within normal limits.  Neurological: Epicritic and protective threshold grossly intact bilaterally.   Musculoskeletal Exam: Range of motion within normal limits to all pedal and ankle joints bilateral. Muscle strength 5/5 in all groups bilateral.  There is a reducible hallux malleus noted to the left hallux.  The toe is plantarflexed with an excessive amount of pressure to the distal tuft of the toe.  Tenderness to palpation also noted.  Assessment: 1.  Hallux malleus left 2.  Distal tuft callus left hallux 3.  h/o recurrent ulcerations left hallux 4.  Diabetes mellitus II -controlled, uncomplicated   Plan of Care:  1. Patient evaluated. 2.  Today I discussed with the patient his different treatment options both conservatively and surgically.  The patient would like to proceed with surgical care.  He says that insurance would not pay for diabetic shoes.  He would like to have surgical intervention to straighten out the big toe.  All possible complications and details of the procedure were explained.  No guarantees were expressed or implied.  All patient questions answered. 3.  Authorization for surgery initiated  today.  Surgery will consist of hallux IPJ arthrodesis left 4.  I did explain to the patient that surgery will be outpatient.  He will need a ride to transport him to and from the surgery center.  He says that he will work something out and he will call and schedule surgery with our surgery scheduler in [place].  Return to clinic 1 week postop      [name], DPM [facility]  Dr. [name], DPM    [address], [place] [address]                Office [phone]  Fax [phone]

## 2018-07-13 ENCOUNTER — Other Ambulatory Visit: Payer: Self-pay

## 2018-07-13 ENCOUNTER — Encounter: Payer: Self-pay | Admitting: Podiatry

## 2018-07-13 ENCOUNTER — Ambulatory Visit: Payer: Medicaid Other | Admitting: Podiatry

## 2018-07-13 VITALS — Temp 97.8°F

## 2018-07-13 DIAGNOSIS — L97521 Non-pressure chronic ulcer of other part of left foot limited to breakdown of skin: Secondary | ICD-10-CM | POA: Diagnosis not present

## 2018-07-13 DIAGNOSIS — B351 Tinea unguium: Secondary | ICD-10-CM

## 2018-07-13 DIAGNOSIS — E1143 Type 2 diabetes mellitus with diabetic autonomic (poly)neuropathy: Secondary | ICD-10-CM

## 2018-07-13 DIAGNOSIS — E0843 Diabetes mellitus due to underlying condition with diabetic autonomic (poly)neuropathy: Secondary | ICD-10-CM

## 2018-07-13 DIAGNOSIS — M79676 Pain in unspecified toe(s): Secondary | ICD-10-CM

## 2018-07-13 DIAGNOSIS — L989 Disorder of the skin and subcutaneous tissue, unspecified: Secondary | ICD-10-CM | POA: Diagnosis not present

## 2018-07-13 DIAGNOSIS — M205X2 Other deformities of toe(s) (acquired), left foot: Secondary | ICD-10-CM

## 2018-07-13 MED ORDER — GABAPENTIN 100 MG PO CAPS: 100.0000 mg | ORAL_CAPSULE | Freq: Two times a day (BID) | ORAL | 3 refills | Status: DC

## 2018-07-13 NOTE — Progress Notes (Signed)
   HPI: 53 year old male presents the office today for reevaluation regarding a previous ulcer that he had on the distal tuft of the left hallux.  Patient states he would like to hold off on surgery which was discussed last visit.  He presents today to have his nails trimmed they are long and tender and he is unable to trim his own nails.  Patient also states that he has pins-and-needles and tingling sensation to his bilateral feet that keeps him up at night.  This is been ongoing for several years and he has not done anything for treatment.  No past medical history on file.   Physical Exam: General: The patient is alert and oriented x3 in no acute distress.  Dermatology: Skin is warm, dry and supple bilateral lower extremities. Negative for open lesions or macerations.  There is some hyperkeratotic callus tissue noted to the distal tuft of the left toe with tenderness to palpation.  Elongated, thickened nails noted 1-5 bilateral  Vascular: Palpable pedal pulses bilaterally. No edema or erythema noted. Capillary refill within normal limits.  Neurological: Epicritic and protective threshold grossly intact bilaterally.   Musculoskeletal Exam: Range of motion within normal limits to all pedal and ankle joints bilateral. Muscle strength 5/5 in all groups bilateral.  There is a reducible hallux malleus noted to the left hallux.  The toe is plantarflexed with an excessive amount of pressure to the distal tuft of the toe.  Tenderness to palpation also noted.  Assessment: 1.  Hallux malleus left 2.  Distal tuft callus left hallux 3.  h/o recurrent ulcerations left hallux 4.  Diabetes mellitus II -controlled, uncomplicated 5.  Peripheral polyneuropathy secondary to diabetes mellitus   Plan of Care:  1. Patient evaluated. 2.  Mechanical debridement of nails 1-5 bilateral was performed using a nail nipper without incident or bleeding 3.  Excisional debridement of the hyperkeratotic pre-ulcerative  callus lesion performed to the plantar aspect of the left great toe without incident or bleeding using a chisel blade 4.  Prescription for gabapentin 100 mg twice daily 5.  Recommend good supportive shoe gear 6.  Return to clinic in 3 months for routine care     [name], DPM [facility]  Dr. [name], DPM    [address], [place] [address]                Office [phone]  Fax [phone]

## 2018-08-04 ENCOUNTER — Other Ambulatory Visit: Payer: Self-pay | Admitting: Podiatry

## 2018-08-09 ENCOUNTER — Ambulatory Visit: Payer: Medicaid Other | Admitting: Podiatry

## 2018-08-09 ENCOUNTER — Other Ambulatory Visit: Payer: Self-pay

## 2018-08-09 ENCOUNTER — Encounter: Payer: Self-pay | Admitting: Podiatry

## 2018-08-09 VITALS — Temp 97.7°F

## 2018-08-09 DIAGNOSIS — E0843 Diabetes mellitus due to underlying condition with diabetic autonomic (poly)neuropathy: Secondary | ICD-10-CM | POA: Diagnosis not present

## 2018-08-09 DIAGNOSIS — L97521 Non-pressure chronic ulcer of other part of left foot limited to breakdown of skin: Secondary | ICD-10-CM | POA: Diagnosis not present

## 2018-08-09 NOTE — Progress Notes (Signed)
This patient presents the office today for continued evaluation of an ulcer on the bottom of his left big toe.  He states that he had not ulcer which became a callus and now the callus is doing much better.  Patient is not having any pain or discomfort when he walks.  Patient denies any drainage from the site.  Vascular  Dorsalis pedis and posterior tibial pulses are palpable  B/L.  Capillary return  WNL.  Temperature gradient is  WNL.  Skin turgor  WNL  Sensorium  Senn Weinstein monofilament wire  WNL. Normal tactile sensation.  Nail Exam  Patient has normal nails with no evidence of bacterial or fungal infection.  Orthopedic  Exam  Muscle tone and muscle strength  WNL.  No limitations of motion feet  B/L.  No crepitus or joint effusion noted.  Foot type is unremarkable and digits show no abnormalities.  Hallux malleus  Left.  Skin  No open lesions.  Normal skin texture and turgor.  Healed ulcer left hallux.  ROV.  Patient was told to return to the office in 3 months for preventative foot care services   Gardiner Barefoot DPM

## 2018-09-27 ENCOUNTER — Other Ambulatory Visit: Payer: Self-pay

## 2018-09-27 DIAGNOSIS — Z20822 Contact with and (suspected) exposure to covid-19: Secondary | ICD-10-CM

## 2018-10-02 LAB — NOVEL CORONAVIRUS, NAA: SARS-CoV-2, NAA: NOT DETECTED

## 2018-10-08 ENCOUNTER — Telehealth: Payer: Self-pay

## 2018-10-08 NOTE — Telephone Encounter (Signed)
Patient is calling back to receive his negative COVID test results. Patient expressed understanding.

## 2018-10-18 ENCOUNTER — Encounter: Payer: Self-pay | Admitting: Podiatry

## 2018-10-18 ENCOUNTER — Other Ambulatory Visit: Payer: Self-pay

## 2018-10-18 ENCOUNTER — Ambulatory Visit (INDEPENDENT_AMBULATORY_CARE_PROVIDER_SITE_OTHER): Payer: Medicaid Other | Admitting: Podiatry

## 2018-10-18 VITALS — Temp 97.3°F

## 2018-10-18 DIAGNOSIS — E0843 Diabetes mellitus due to underlying condition with diabetic autonomic (poly)neuropathy: Secondary | ICD-10-CM

## 2018-10-18 DIAGNOSIS — M79676 Pain in unspecified toe(s): Secondary | ICD-10-CM | POA: Diagnosis not present

## 2018-10-18 DIAGNOSIS — L97521 Non-pressure chronic ulcer of other part of left foot limited to breakdown of skin: Secondary | ICD-10-CM | POA: Diagnosis not present

## 2018-10-18 DIAGNOSIS — B351 Tinea unguium: Secondary | ICD-10-CM | POA: Diagnosis not present

## 2018-10-18 NOTE — Progress Notes (Signed)
This patient presents the office today for continued evaluation of an ulcer on the bottom of his left big toe.  He states that he determined the ulcer was formed wearing leather shoes.  He has stopped wearing these shoes and the skin ulcer has healed.  He has pain and discomfort in both big toes  both feet. He presents for preventative foot care services.  Vascular  Dorsalis pedis and posterior tibial pulses are palpable  B/L.  Capillary return  WNL.  Temperature gradient is  WNL.  Skin turgor  WNL  Sensorium  Senn Weinstein monofilament wire  WNL. Normal tactile sensation.  Nail Exam  Patient has normal nails 2-5 B/L.  Thick painful ingrown toenails both feet.  Orthopedic  Exam  Muscle tone and muscle strength  WNL.  No limitations of motion feet  B/L.  No crepitus or joint effusion noted.  Foot type is unremarkable and digits show no abnormalities.  Hallux malleus  Left.  Skin  No open lesions.  Normal skin texture and turgor.  Healed ulcer left hallux.  Onychomycosis  X 2.  ROV.  Patient was told to return to the office in 3 months for preventative foot care services.  Debride nails  X 2.   Gardiner Barefoot DPM

## 2019-01-17 ENCOUNTER — Encounter: Payer: Self-pay | Admitting: Podiatry

## 2019-01-17 ENCOUNTER — Other Ambulatory Visit: Payer: Self-pay

## 2019-01-17 ENCOUNTER — Ambulatory Visit: Payer: Medicaid Other | Admitting: Podiatry

## 2019-01-17 DIAGNOSIS — M79676 Pain in unspecified toe(s): Secondary | ICD-10-CM | POA: Diagnosis not present

## 2019-01-17 DIAGNOSIS — B351 Tinea unguium: Secondary | ICD-10-CM

## 2019-01-17 DIAGNOSIS — E0843 Diabetes mellitus due to underlying condition with diabetic autonomic (poly)neuropathy: Secondary | ICD-10-CM

## 2019-01-17 NOTE — Progress Notes (Signed)
This patient presents the office today for continued evaluation of an ulcer on the bottom of his left big toe.  He states that he determined the ulcer was formed wearing leather shoes.    He has pain and discomfort in both big toes  both feet. He presents for preventative foot care services.  Vascular  Dorsalis pedis and posterior tibial pulses are palpable  B/L.  Capillary return  WNL.  Temperature gradient is  WNL.  Skin turgor  WNL  Sensorium  Senn Weinstein monofilament wire  WNL. Normal tactile sensation.  Nail Exam  Patient has normal nails 2-5 B/L.  Thick painful ingrown toenails both feet.  Orthopedic  Exam  Muscle tone and muscle strength  WNL.  No limitations of motion feet  B/L.  No crepitus or joint effusion noted.  Foot type is unremarkable and digits show no abnormalities.  Hallux malleus  Left.  Skin  No open lesions.  Normal skin texture and turgor.   Onychomycosis  X 2.  ROV.  Patient was told to return to the office in 3 months for preventative foot care services.  Debride nails  X 2.   Gardiner Barefoot DPM

## 2019-04-18 ENCOUNTER — Other Ambulatory Visit: Payer: Self-pay

## 2019-04-18 ENCOUNTER — Encounter: Payer: Self-pay | Admitting: Podiatry

## 2019-04-18 ENCOUNTER — Ambulatory Visit: Payer: Medicaid Other | Admitting: Podiatry

## 2019-04-18 DIAGNOSIS — M79676 Pain in unspecified toe(s): Secondary | ICD-10-CM

## 2019-04-18 DIAGNOSIS — B351 Tinea unguium: Secondary | ICD-10-CM | POA: Diagnosis not present

## 2019-04-18 DIAGNOSIS — M2032 Hallux varus (acquired), left foot: Secondary | ICD-10-CM

## 2019-04-18 DIAGNOSIS — M2030 Hallux varus (acquired), unspecified foot: Secondary | ICD-10-CM | POA: Insufficient documentation

## 2019-04-18 DIAGNOSIS — E0843 Diabetes mellitus due to underlying condition with diabetic autonomic (poly)neuropathy: Secondary | ICD-10-CM | POA: Diagnosis not present

## 2019-04-18 NOTE — Progress Notes (Signed)
Complaint:  Visit Type: Patient returns to my office for continued preventative foot care services. Complaint: Patient states" my nails have grown long and thick and become painful to walk and wear shoes" Patient has been diagnosed with DM with no foot complications. The patient presents for preventative foot care services.  Podiatric Exam: Vascular: dorsalis pedis and posterior tibial pulses are palpable bilateral. Capillary return is immediate. Temperature gradient is WNL. Skin turgor WNL  Sensorium: Normal Semmes Weinstein monofilament test. Normal tactile sensation bilaterally. Nail Exam: Pt has thick disfigured discolored nails with subungual debris noted bilateral hallux nails. Ulcer Exam: There is no evidence of ulcer or pre-ulcerative changes or infection. Orthopedic Exam: Muscle tone and strength are WNL. No limitations in general ROM. No crepitus or effusions noted. Foot type and digits show no abnormalities. Bony prominences are unremarkable. Skin: No Porokeratosis. No infection or ulcers  Diagnosis:  Onychomycosis, , Pain in right toe, pain in left toes  Treatment & Plan Procedures and Treatment: Consent by patient was obtained for treatment procedures.   Debridement of mycotic and hypertrophic toenails, 1 through 5 bilateral and clearing of subungual debris. No ulceration, no infection noted.  Return Visit-Office Procedure: Patient instructed to return to the office for a follow up visit 3 months for continued evaluation and treatment.    Gardiner Barefoot DPM

## 2019-06-24 DIAGNOSIS — Z7689 Persons encountering health services in other specified circumstances: Secondary | ICD-10-CM | POA: Insufficient documentation

## 2019-06-24 DIAGNOSIS — G629 Polyneuropathy, unspecified: Secondary | ICD-10-CM | POA: Insufficient documentation

## 2019-06-27 DIAGNOSIS — E785 Hyperlipidemia, unspecified: Secondary | ICD-10-CM | POA: Insufficient documentation

## 2019-07-18 ENCOUNTER — Ambulatory Visit: Payer: Medicaid Other | Admitting: Podiatry

## 2019-08-01 ENCOUNTER — Ambulatory Visit: Payer: Medicaid Other | Admitting: Podiatry

## 2019-08-19 ENCOUNTER — Other Ambulatory Visit: Payer: Self-pay

## 2019-08-19 ENCOUNTER — Encounter: Payer: Self-pay | Admitting: Podiatry

## 2019-08-19 ENCOUNTER — Ambulatory Visit: Payer: Medicaid Other | Admitting: Podiatry

## 2019-08-19 VITALS — Temp 98.0°F

## 2019-08-19 DIAGNOSIS — E0843 Diabetes mellitus due to underlying condition with diabetic autonomic (poly)neuropathy: Secondary | ICD-10-CM

## 2019-08-19 DIAGNOSIS — B351 Tinea unguium: Secondary | ICD-10-CM

## 2019-08-19 DIAGNOSIS — M79676 Pain in unspecified toe(s): Secondary | ICD-10-CM | POA: Diagnosis not present

## 2019-08-19 NOTE — Progress Notes (Signed)
Complaint:  Visit Type: Patient returns to my office for continued preventative foot care services. Complaint: Patient states" my nails have grown long and thick and become painful to walk and wear shoes" Patient has been diagnosed with DM with no foot complications. The patient presents for preventative foot care services.  Podiatric Exam: Vascular: dorsalis pedis and posterior tibial pulses are palpable bilateral. Capillary return is immediate. Temperature gradient is WNL. Skin turgor WNL  Sensorium: Normal Semmes Weinstein monofilament test. Normal tactile sensation bilaterally. Nail Exam: Pt has thick disfigured discolored nails with subungual debris noted bilateral hallux nails. Ulcer Exam: There is no evidence of ulcer or pre-ulcerative changes or infection. Orthopedic Exam: Muscle tone and strength are WNL. No limitations in general ROM. No crepitus or effusions noted. Foot type and digits show no abnormalities. Bony prominences are unremarkable. Skin: No Porokeratosis. No infection or ulcers.  Irregular healing laceration left great toe at site of hallux malleus.  Diagnosis:  Onychomycosis, , Pain in right toe, pain in left toes  Treatment & Plan Procedures and Treatment: Consent by patient was obtained for treatment procedures.   Debridement of mycotic and hypertrophic toenails, 1 through 5 bilateral and clearing of subungual debris. No ulceration, no infection noted.  Return Visit-Office Procedure: Patient instructed to return to the office for a follow up visit 3 months for continued evaluation and treatment.    Gardiner Barefoot DPM

## 2019-10-31 ENCOUNTER — Ambulatory Visit (INDEPENDENT_AMBULATORY_CARE_PROVIDER_SITE_OTHER): Payer: Medicaid Other | Admitting: Podiatry

## 2019-10-31 ENCOUNTER — Other Ambulatory Visit: Payer: Self-pay

## 2019-10-31 ENCOUNTER — Encounter: Payer: Self-pay | Admitting: Podiatry

## 2019-10-31 DIAGNOSIS — E0843 Diabetes mellitus due to underlying condition with diabetic autonomic (poly)neuropathy: Secondary | ICD-10-CM | POA: Diagnosis not present

## 2019-10-31 DIAGNOSIS — R234 Changes in skin texture: Secondary | ICD-10-CM | POA: Diagnosis not present

## 2019-11-01 ENCOUNTER — Encounter: Payer: Self-pay | Admitting: Podiatry

## 2019-11-01 NOTE — Progress Notes (Signed)
Subjective:  Patient ID: [name], male    DOB: 26-Aug-1965,  MRN: [id number]  Chief Complaint  Patient presents with  . Callouses    Patient presents today with new wound right hallux.  He says it started as a callous and he pulled the skin off about 3-4 weeks ago, now has split in skin, bloody drainage    54 y.o. male presents with the above complaint.  Patient presents with right hallux fissure with exposure of the dermal layer.  Patient states that this has happened over the last couple of days.  Patient states there is some bloody drainage that has been present.  There has been split in the distal tip of the skin.  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  He states he is used peroxide and Neosporin which has not helped to heal.  Patient is a diabetic with last A1c that is unknown.  He just wants to make sure he does not lose his toe.  He states there is pain to walk on it.  He denies any other acute complaints.  He has not seen anyone else for this prior to seeing me.   Review of Systems: Negative except as noted in the HPI. Denies N/V/F/Ch.  No past medical history on file.  Current Outpatient Medications:  .  albuterol (VENTOLIN HFA) 108 (90 Base) MCG/ACT inhaler, Inhale into the lungs., Disp: , Rfl:  .  brimonidine (ALPHAGAN) 0.2 % ophthalmic solution, Apply to eye., Disp: , Rfl:  .  citalopram (CELEXA) 20 MG tablet, Take by mouth., Disp: , Rfl:  .  clobetasol ointment (TEMOVATE) 0.05 %, Apply twice a day as needed for itchy rash on arm, Disp: , Rfl:  .  dorzolamide-timolol (COSOPT) 22.3-6.8 MG/ML ophthalmic solution, Apply to eye., Disp: , Rfl:  .  fesoterodine (TOVIAZ) 4 MG TB24 tablet, Take by mouth., Disp: , Rfl:  .  Fluticasone-Salmeterol (ADVAIR) 500-50 MCG/DOSE AEPB, Inhale into the lungs., Disp: , Rfl:  .  gabapentin (NEURONTIN) 100 MG capsule, Take by mouth., Disp: , Rfl:  .  gentamicin (GARAMYCIN) 0.3 % ophthalmic ointment, ,  Disp: , Rfl:  .  ketoconazole (NIZORAL) 2 % cream, Twice daily for rash on hands and forearms for 2-3 weeks., Disp: , Rfl:  .  metFORMIN (GLUCOPHAGE-XR) 500 MG 24 hr tablet, Take by mouth., Disp: , Rfl:  .  methazolamide (NEPTAZANE) 50 MG tablet, Take by mouth., Disp: , Rfl:  .  sildenafil (VIAGRA) 50 MG tablet, Take by mouth., Disp: , Rfl:  .  simvastatin (ZOCOR) 20 MG tablet, Take by mouth., Disp: , Rfl:  .  tamsulosin (FLOMAX) 0.4 MG CAPS capsule, Take by mouth., Disp: , Rfl:  .  tiotropium (SPIRIVA HANDIHALER) 18 MCG inhalation capsule, Place into inhaler and inhale., Disp: , Rfl:  .  tobramycin-dexamethasone (TOBRADEX) ophthalmic ointment, PLACE INTO THE RIGHT EYE 2 (TWO) TIMES DAILY, Disp: , Rfl:  .  traZODone (DESYREL) 50 MG tablet, TAKE 3 TABLETS (150 MG TOTAL) BY MOUTH NIGHTLY., Disp: , Rfl:  .  UNABLE TO FIND, Apply topically., Disp: , Rfl:  .  UNABLE TO FIND, Apply topically., Disp: , Rfl:  .  varenicline (CHANTIX) 1 MG tablet, Take by mouth., Disp: , Rfl:  .  verapamil (VERELAN PM) 180 MG 24 hr capsule, Take by mouth., Disp: , Rfl:   Social History   Tobacco Use  Smoking Status Current Every Day Smoker  . Packs/day: 4.00  .  Types: Cigarettes  Smokeless Tobacco Never Used  Tobacco Comment   4/day, currently trying to quit    No Known Allergies Objective:  There were no vitals filed for this visit. There is no height or weight on file to calculate BMI. Constitutional Well developed. Well nourished.  Vascular Dorsalis pedis pulses palpable bilaterally. Posterior tibial pulses palpable bilaterally. Capillary refill normal to all digits.  No cyanosis or clubbing noted. Pedal hair growth normal.  Neurologic Normal speech. Oriented to person, place, and time. Epicritic sensation to light touch grossly present bilaterally.  Dermatologic  right hallux distal tip fissure noted with exposure of the deep tissue leading to bleeding.  Does not exposed to bone.  No fat layer  exposed.  No clinical signs of infection noted.  Orthopedic: Normal joint ROM without pain or crepitus bilaterally. No visible deformities. No bony tenderness.   Radiographs: None Assessment:   1. Diabetes mellitus due to underlying condition with diabetic autonomic neuropathy, unspecified whether long term insulin use ([place])   2. Fissure in skin of foot    Plan:  Patient was evaluated and treated and all questions answered.  Right hallux fissure to the deep dermal tissue without exposure of the fat layer -I explained to patient the etiology of hallux distal tip fissure and various treatment options were discussed.  Clinically, patient should be able to heal it with aggressive offloading and local wound care.  I encouraged him to do not plan any water or fluid touch the wound site.  Patient states understanding.  He will also ambulate with surgical shoe.  At this time I will hold off on any antibiotics as there is no clinically infected.  I have encouraged him to use a bag when he is working to not allow debris to go into the wound.  Patient states understanding -Patient is a high risk of losing the digit given that he is diabetic with unknown A1c.  No follow-ups on file.

## 2019-11-19 ENCOUNTER — Encounter: Payer: Self-pay | Admitting: Podiatry

## 2019-11-19 ENCOUNTER — Ambulatory Visit: Payer: Medicaid Other | Admitting: Podiatry

## 2019-11-19 ENCOUNTER — Other Ambulatory Visit: Payer: Self-pay

## 2019-11-19 DIAGNOSIS — E0843 Diabetes mellitus due to underlying condition with diabetic autonomic (poly)neuropathy: Secondary | ICD-10-CM | POA: Diagnosis not present

## 2019-11-19 DIAGNOSIS — R234 Changes in skin texture: Secondary | ICD-10-CM

## 2019-11-19 NOTE — Progress Notes (Signed)
Subjective:  Patient ID: [name], male    DOB: 01/28/1966,  MRN: [id number]  No chief complaint on file.   54 y.o. male presents with the above complaint.  Patient presents for follow-up of right hallux fissure.  Patient states he has been doing Betadine wet-to-dry dressing change he has been keeping it clean dry and intact and has been wearing surgical shoe.  He states that he has been really babying it which helped decrease fissure and revealed the underlying layer.  He denies any other acute complaints.   Review of Systems: Negative except as noted in the HPI. Denies N/V/F/Ch.  No past medical history on file.  Current Outpatient Medications:  .  albuterol (VENTOLIN HFA) 108 (90 Base) MCG/ACT inhaler, Inhale into the lungs., Disp: , Rfl:  .  brimonidine (ALPHAGAN) 0.2 % ophthalmic solution, Apply to eye., Disp: , Rfl:  .  citalopram (CELEXA) 20 MG tablet, Take by mouth., Disp: , Rfl:  .  clobetasol ointment (TEMOVATE) 0.05 %, Apply twice a day as needed for itchy rash on arm, Disp: , Rfl:  .  dorzolamide-timolol (COSOPT) 22.3-6.8 MG/ML ophthalmic solution, Apply to eye., Disp: , Rfl:  .  fesoterodine (TOVIAZ) 4 MG TB24 tablet, Take by mouth., Disp: , Rfl:  .  Fluticasone-Salmeterol (ADVAIR) 500-50 MCG/DOSE AEPB, Inhale into the lungs., Disp: , Rfl:  .  gabapentin (NEURONTIN) 100 MG capsule, Take by mouth., Disp: , Rfl:  .  gentamicin (GARAMYCIN) 0.3 % ophthalmic ointment, , Disp: , Rfl:  .  ketoconazole (NIZORAL) 2 % cream, Twice daily for rash on hands and forearms for 2-3 weeks., Disp: , Rfl:  .  metFORMIN (GLUCOPHAGE-XR) 500 MG 24 hr tablet, Take by mouth., Disp: , Rfl:  .  methazolamide (NEPTAZANE) 50 MG tablet, Take by mouth., Disp: , Rfl:  .  sildenafil (VIAGRA) 50 MG tablet, Take by mouth., Disp: , Rfl:  .  simvastatin (ZOCOR) 20 MG tablet, Take by mouth., Disp: , Rfl:  .  tamsulosin (FLOMAX) 0.4 MG CAPS capsule, Take by mouth., Disp: , Rfl:  .  tiotropium (SPIRIVA  HANDIHALER) 18 MCG inhalation capsule, Place into inhaler and inhale., Disp: , Rfl:  .  tobramycin-dexamethasone (TOBRADEX) ophthalmic ointment, PLACE INTO THE RIGHT EYE 2 (TWO) TIMES DAILY, Disp: , Rfl:  .  traZODone (DESYREL) 50 MG tablet, TAKE 3 TABLETS (150 MG TOTAL) BY MOUTH NIGHTLY., Disp: , Rfl:  .  UNABLE TO FIND, Apply topically., Disp: , Rfl:  .  UNABLE TO FIND, Apply topically., Disp: , Rfl:  .  varenicline (CHANTIX) 1 MG tablet, Take by mouth., Disp: , Rfl:  .  verapamil (VERELAN PM) 180 MG 24 hr capsule, Take by mouth., Disp: , Rfl:   Social History   Tobacco Use  Smoking Status Current Every Day Smoker  . Packs/day: 4.00  . Types: Cigarettes  Smokeless Tobacco Never Used  Tobacco Comment   4/day, currently trying to quit    No Known Allergies Objective:  There were no vitals filed for this visit. There is no height or weight on file to calculate BMI. Constitutional Well developed. Well nourished.  Vascular Dorsalis pedis pulses palpable bilaterally. Posterior tibial pulses palpable bilaterally. Capillary refill normal to all digits.  No cyanosis or clubbing noted. Pedal hair growth normal.  Neurologic Normal speech. Oriented to person, place, and time. Epicritic sensation to light touch grossly present bilaterally.  Dermatologic  right hallux distal tip fissure which has completely reepithelialized without concern for deeper ulceration.  Does  not exposed to bone.  No fat layer exposed.  No clinical signs of infection noted.  Orthopedic: Normal joint ROM without pain or crepitus bilaterally. No visible deformities. No bony tenderness.   Radiographs: None Assessment:   1. Diabetes mellitus due to underlying condition with diabetic autonomic neuropathy, unspecified whether long term insulin use ([place])   2. Fissure in skin of foot    Plan:  Patient was evaluated and treated and all questions answered.  Right hallux fissure to the deep dermal tissue without  exposure of the fat layer -Clinically the fissure site has healed.  Upon debridement complete reepithelialization of the skin tissue noted.  No underlying ulceration noted.  I encouraged patient to return to regular sneakers however keep an monitor tender over the reoccurrence of the fissure.  Patient states understanding and he will do so. -Also discussed with him shoe gear modification as well.  Patient states understanding.  No follow-ups on file.

## 2019-11-25 ENCOUNTER — Ambulatory Visit: Payer: Medicaid Other | Admitting: Podiatry

## 2020-04-16 ENCOUNTER — Other Ambulatory Visit: Payer: Self-pay

## 2020-04-16 ENCOUNTER — Encounter: Payer: Self-pay | Admitting: Podiatry

## 2020-04-16 ENCOUNTER — Ambulatory Visit (INDEPENDENT_AMBULATORY_CARE_PROVIDER_SITE_OTHER): Payer: Medicaid Other | Admitting: Podiatry

## 2020-04-16 DIAGNOSIS — B351 Tinea unguium: Secondary | ICD-10-CM | POA: Diagnosis not present

## 2020-04-16 DIAGNOSIS — E0843 Diabetes mellitus due to underlying condition with diabetic autonomic (poly)neuropathy: Secondary | ICD-10-CM

## 2020-04-16 DIAGNOSIS — M79676 Pain in unspecified toe(s): Secondary | ICD-10-CM

## 2020-04-16 NOTE — Progress Notes (Signed)
Complaint:  Visit Type: Patient returns to my office for continued preventative foot care services. Complaint: Patient states" my nails have grown long and thick and become painful to walk and wear shoes" Patient has been diagnosed with DM with no foot complications. The patient presents for preventative foot care services.  Podiatric Exam: Vascular: dorsalis pedis and posterior tibial pulses are palpable bilateral. Capillary return is immediate. Temperature gradient is WNL. Skin turgor WNL  Sensorium: Normal Semmes Weinstein monofilament test. Normal tactile sensation bilaterally. Nail Exam: Pt has thick disfigured discolored nails with subungual debris noted bilateral hallux nails. Ulcer Exam: There is no evidence of ulcer or pre-ulcerative changes or infection. Orthopedic Exam: Muscle tone and strength are WNL. No limitations in general ROM. No crepitus or effusions noted. Foot type and digits show no abnormalities. Bony prominences are unremarkable. Skin: No Porokeratosis. No infection or ulcers.  Irregular healing laceration left great toe at site of hallux malleus.  Diagnosis:  Onychomycosis, , Pain in right toe, pain in left toes  Treatment & Plan Procedures and Treatment: Consent by patient was obtained for treatment procedures.   Debridement of mycotic and hypertrophic toenails, 1 through 5 bilateral and clearing of subungual debris. No ulceration, no infection noted.  Return Visit-Office Procedure: Patient instructed to return to the office for a follow up visit 3 months for continued evaluation and treatment.    Trayton Szabo DPM 

## 2020-07-16 ENCOUNTER — Other Ambulatory Visit: Payer: Self-pay

## 2020-07-16 ENCOUNTER — Ambulatory Visit (INDEPENDENT_AMBULATORY_CARE_PROVIDER_SITE_OTHER): Payer: Medicaid Other | Admitting: Podiatry

## 2020-07-16 ENCOUNTER — Encounter: Payer: Self-pay | Admitting: Podiatry

## 2020-07-16 DIAGNOSIS — M2032 Hallux varus (acquired), left foot: Secondary | ICD-10-CM | POA: Diagnosis not present

## 2020-07-16 DIAGNOSIS — M79676 Pain in unspecified toe(s): Secondary | ICD-10-CM | POA: Diagnosis not present

## 2020-07-16 DIAGNOSIS — E0843 Diabetes mellitus due to underlying condition with diabetic autonomic (poly)neuropathy: Secondary | ICD-10-CM | POA: Diagnosis not present

## 2020-07-16 DIAGNOSIS — B351 Tinea unguium: Secondary | ICD-10-CM | POA: Diagnosis not present

## 2020-07-16 NOTE — Progress Notes (Signed)
Complaint:  Visit Type: Patient returns to my office for continued preventative foot care services. Complaint: Patient states" my nails have grown long and thick and become painful to walk and wear shoes" Patient has been diagnosed with DM with no foot complications. The patient presents for preventative foot care services.  Podiatric Exam: Vascular: dorsalis pedis and posterior tibial pulses are palpable bilateral. Capillary return is immediate. Temperature gradient is WNL. Skin turgor WNL  Sensorium: Normal Semmes Weinstein monofilament test. Normal tactile sensation bilaterally. Nail Exam: Pt has thick disfigured discolored nails with subungual debris noted bilateral hallux nails. Ulcer Exam: There is no evidence of ulcer or pre-ulcerative changes or infection. Orthopedic Exam: Muscle tone and strength are WNL. No limitations in general ROM. No crepitus or effusions noted. Foot type and digits show no abnormalities. Bony prominences are unremarkable. Skin: No Porokeratosis. No infection or ulcers  Diagnosis:  Onychomycosis, , Pain in right toe, pain in left toes  Treatment & Plan Procedures and Treatment: Consent by patient was obtained for treatment procedures.   Debridement of mycotic and hypertrophic toenails, 1 through 5 bilateral and clearing of subungual debris. No ulceration, no infection noted.  Return Visit-Office Procedure: Patient instructed to return to the office for a follow up visit 3 months for continued evaluation and treatment.    Kaleya Douse DPM 

## 2020-09-04 IMAGING — CR DG TOE GREAT 2+V*L*
1 series · 4 of 4 positions shown · non-contrast
Comparison: None.

CLINICAL DATA: Left toe wound, not healing for 3 months.

EXAM:
LEFT GREAT TOE

[Series 2: x toes obl left · 0.14mm/px · 4 of 4 slices shown]
[im 1/4]
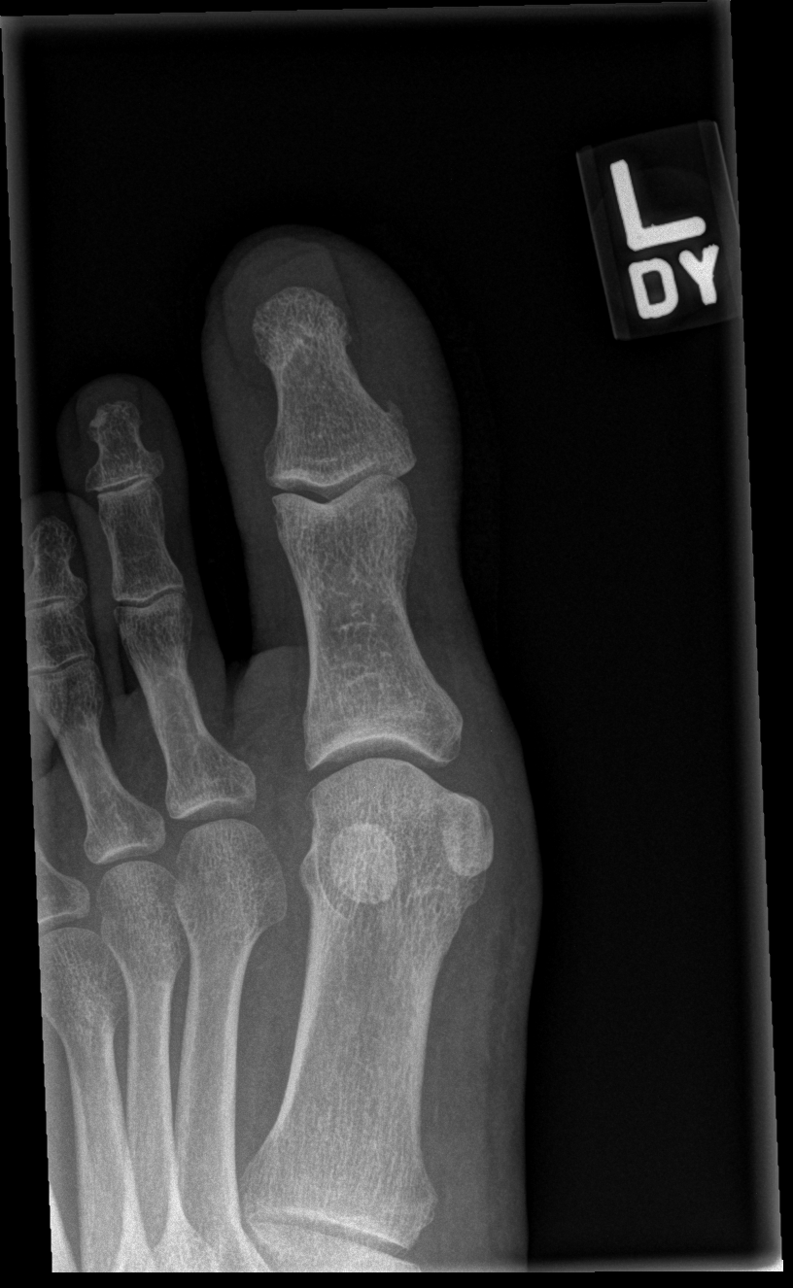
[im 2/4]
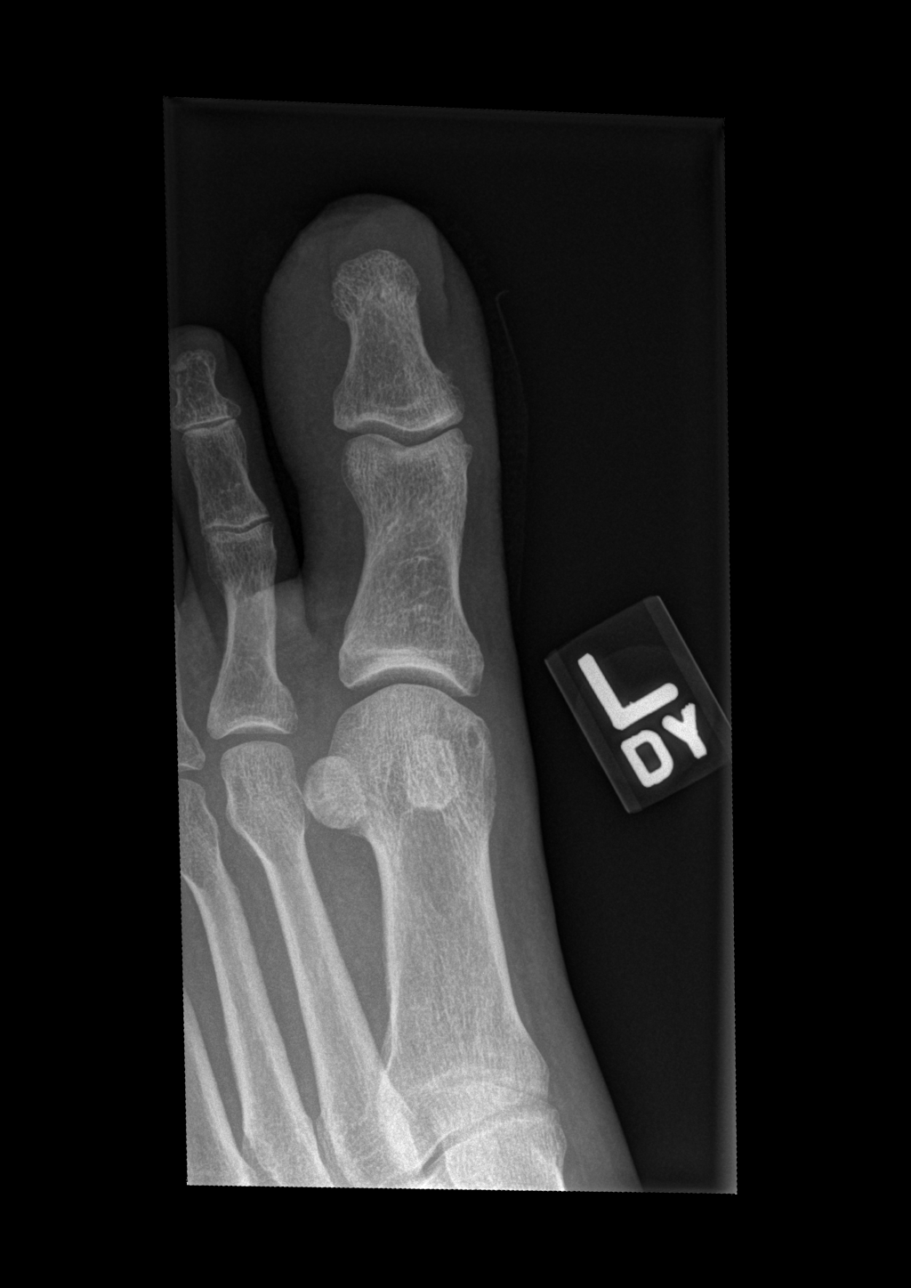
[im 3/4]
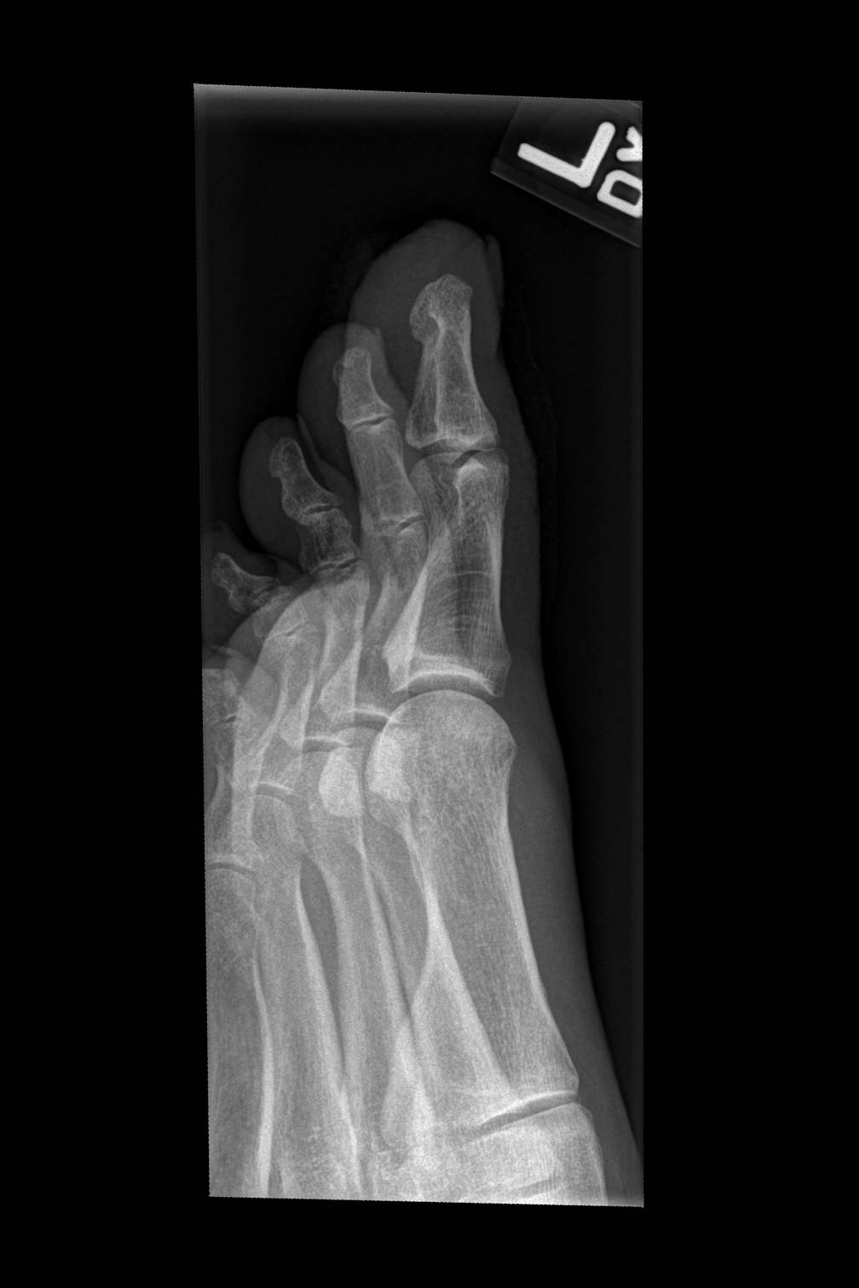
[im 4/4]
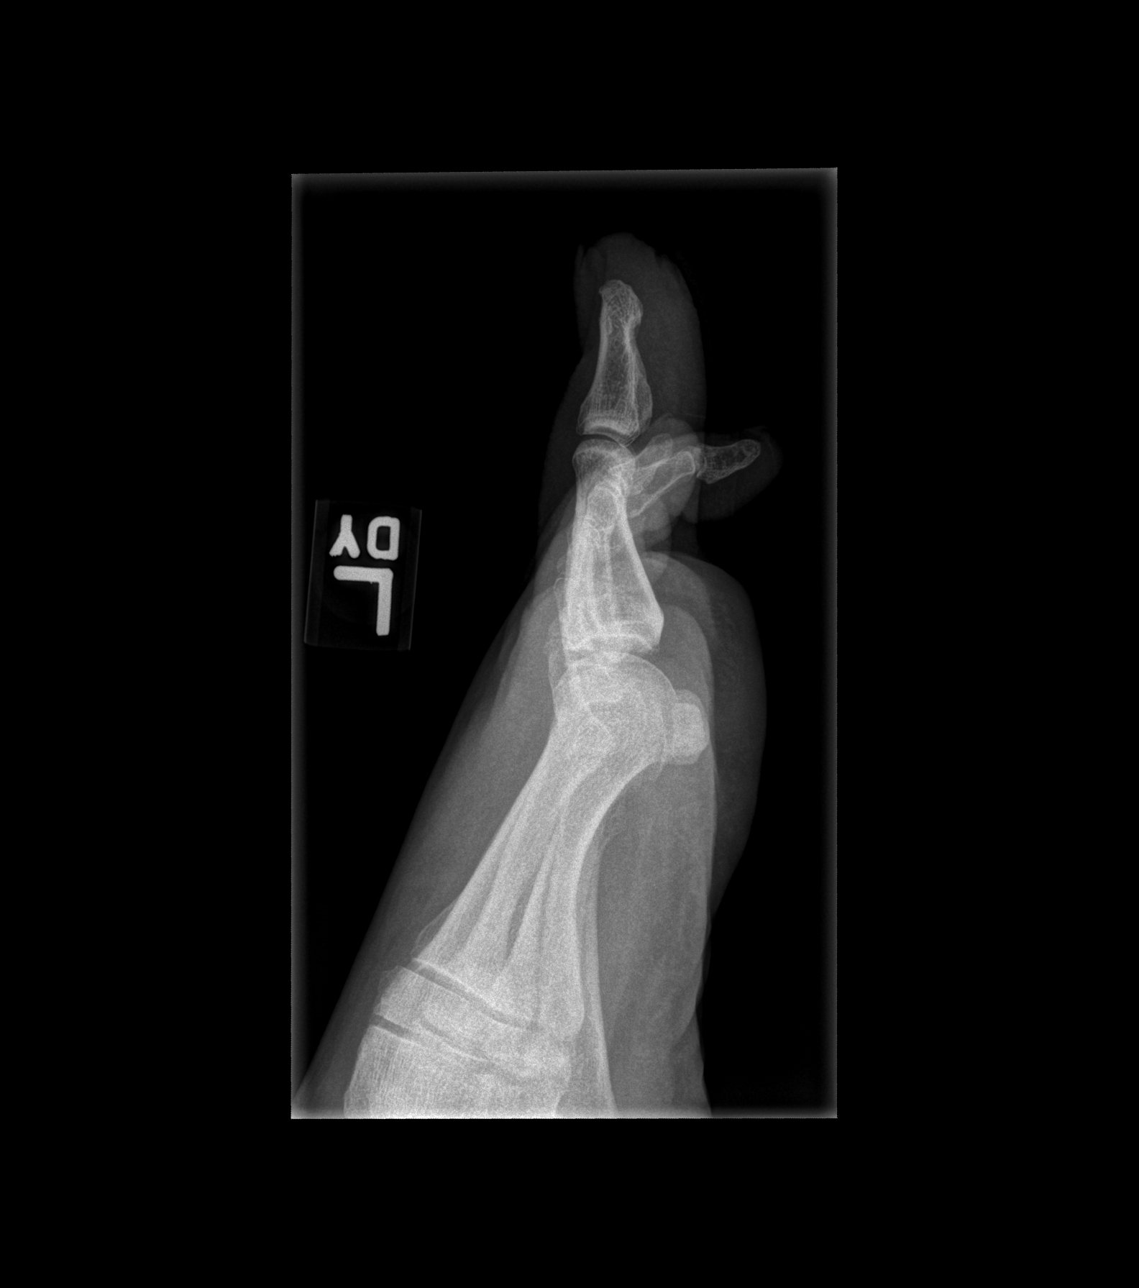

[4 of 4 positions shown; findings below may reference images not displayed]

FINDINGS: There is a soft tissue wound at the distal plantar aspect of the
left great toe. No osseous abnormality. No soft tissue gas.
IMPRESSION: No osseous abnormality of the left great toe. Plantar soft tissue
wound.

## 2020-10-14 ENCOUNTER — Ambulatory Visit: Payer: Medicaid Other | Admitting: Podiatry

## 2020-10-14 ENCOUNTER — Other Ambulatory Visit: Payer: Self-pay

## 2020-10-14 DIAGNOSIS — L97412 Non-pressure chronic ulcer of right heel and midfoot with fat layer exposed: Secondary | ICD-10-CM | POA: Diagnosis not present

## 2020-10-14 DIAGNOSIS — E0843 Diabetes mellitus due to underlying condition with diabetic autonomic (poly)neuropathy: Secondary | ICD-10-CM

## 2020-10-14 DIAGNOSIS — E11621 Type 2 diabetes mellitus with foot ulcer: Secondary | ICD-10-CM

## 2020-10-14 MED ORDER — MUPIROCIN 2 % EX OINT: 1.0000 "application " | TOPICAL_OINTMENT | Freq: Every day | CUTANEOUS | 2 refills | Status: DC

## 2020-10-14 NOTE — Patient Instructions (Signed)
In 1 week on 10/21/20, change the dressing after you are done working, remove it and shower, then apply the mupirocin ointment daily with a clean bandage.

## 2020-10-18 ENCOUNTER — Encounter: Payer: Self-pay | Admitting: Podiatry

## 2020-10-18 NOTE — Progress Notes (Signed)
  Subjective:  Patient ID: [name], male    DOB: 1965-12-14,  MRN: [id number]  Chief Complaint  Patient presents with   Foot Pain     *NEEDS MEDICAID ABN FOR DME* need right foot looked at    55 y.o. male presents with the above complaint. History confirmed with patient.  Wound is present for about a week he thinks.  His A1c is 5.8.  Objective:  Physical Exam: warm, good capillary refill, no trophic changes or ulcerative lesions, and normal DP and PT pulses.  Full-thickness ulceration with exposed subcutaneous tissue 1.0 x 0.7 x 0.2 cm ulceration with no exposed bone tendon or joint.  Fully granular wound bed.  No signs of infection      Assessment:   1. Diabetic ulcer of right midfoot associated with type 2 diabetes mellitus, with fat layer exposed ([place])   2. Diabetes mellitus due to underlying condition with diabetic autonomic neuropathy, unspecified whether long term insulin use ([place])      Plan:  Patient was evaluated and treated and all questions answered.  Patient educated on diabetes. Discussed proper diabetic foot care and discussed risks and complications of disease. Educated patient in depth on reasons to return to the office immediately should he/she discover anything concerning or new on the feet. All questions answered. Discussed proper shoes as well.   Ulcer right foot -We discussed the etiology and factors that are a part of the wound healing process.  We also discussed the risk of infection both soft tissue and osteomyelitis from open ulceration.  Discussed the risk of limb loss if this happens or worsens. -Advised to stop smoking to allow for wound healing and we discussed how this negatively impacts at -Debridement as below.  The wound bed was prepared and all devitalized tissue was removed.  A donated piece of PuraPly XT was applied to promote wound healing provide for collagen matrix scaffolding and reduce bioburden.  Lot number XT [id number] expiration  07/27/2021 -He will change in 1 week and apply mupirocin ointment this was prescribed for him -Continue off-loading with surgical shoe.  Procedure: Excisional Debridement of Wound Rationale: Removal of non-viable soft tissue from the wound to promote healing.  Anesthesia: none Post-Debridement Wound Measurements: 1.0 cm x 0.8 cm x 0.2 cm  Type of Debridement: Sharp Excisional Tissue Removed: Non-viable soft tissue Depth of Debridement: subcutaneous tissue. Technique: Sharp excisional debridement to bleeding, viable wound base.  Dressing: Dry, sterile, compression dressing. Disposition: Patient tolerated procedure well.       Return in about 2 weeks (around 10/28/2020) for wound care.

## 2020-10-22 ENCOUNTER — Encounter: Payer: Self-pay | Admitting: Podiatry

## 2020-10-22 ENCOUNTER — Ambulatory Visit: Payer: Medicaid Other | Admitting: Podiatry

## 2020-10-22 ENCOUNTER — Other Ambulatory Visit: Payer: Self-pay

## 2020-10-22 DIAGNOSIS — E0843 Diabetes mellitus due to underlying condition with diabetic autonomic (poly)neuropathy: Secondary | ICD-10-CM | POA: Diagnosis not present

## 2020-10-22 DIAGNOSIS — L97412 Non-pressure chronic ulcer of right heel and midfoot with fat layer exposed: Secondary | ICD-10-CM

## 2020-10-22 DIAGNOSIS — M79676 Pain in unspecified toe(s): Secondary | ICD-10-CM

## 2020-10-22 DIAGNOSIS — E11621 Type 2 diabetes mellitus with foot ulcer: Secondary | ICD-10-CM | POA: Diagnosis not present

## 2020-10-22 DIAGNOSIS — B351 Tinea unguium: Secondary | ICD-10-CM

## 2020-10-22 NOTE — Progress Notes (Signed)
Complaint:  Visit Type: Patient returns to my office for continued preventative foot care services. Complaint: Patient states" my nails have grown long and thick and become painful to walk and wear shoes" Patient has been diagnosed with DM with no foot complications. The patient presents for preventative foot care services.  Patient under treatment by Dr.  Sherryle Lis for diabetic ulcer right foot.  Podiatric Exam: Vascular: dorsalis pedis and posterior tibial pulses are palpable bilateral. Capillary return is immediate. Temperature gradient is WNL. Skin turgor WNL  Sensorium: Normal Semmes Weinstein monofilament test. Normal tactile sensation bilaterally. Nail Exam: Pt has thick disfigured discolored nails with subungual debris noted bilateral hallux nails. Ulcer Exam: There is no evidence of ulcer or pre-ulcerative changes or infection. Orthopedic Exam: Muscle tone and strength are WNL. No limitations in general ROM. No crepitus or effusions noted. Foot type and digits show no abnormalities. Bony prominences are unremarkable. Skin: No Porokeratosis. Healing diabetic ulcer right foot.  Diagnosis:  Onychomycosis, , Pain in right toe, pain in left toes  Treatment & Plan Procedures and Treatment: Consent by patient was obtained for treatment procedures.   Debridement of mycotic and hypertrophic toenails, 1 through 5 bilateral and clearing of subungual debris. No ulceration, no infection noted.  Return Visit-Office Procedure: Patient instructed to return to the office for a follow up visit 3 months for continued evaluation and treatment.    Gardiner Barefoot DPM

## 2020-10-28 ENCOUNTER — Ambulatory Visit: Payer: Medicaid Other | Admitting: Podiatry

## 2020-10-28 ENCOUNTER — Other Ambulatory Visit: Payer: Self-pay

## 2020-10-28 DIAGNOSIS — E08621 Diabetes mellitus due to underlying condition with foot ulcer: Secondary | ICD-10-CM | POA: Diagnosis not present

## 2020-10-28 DIAGNOSIS — L97519 Non-pressure chronic ulcer of other part of right foot with unspecified severity: Secondary | ICD-10-CM | POA: Diagnosis not present

## 2020-11-02 ENCOUNTER — Encounter: Payer: Self-pay | Admitting: Podiatry

## 2020-11-02 DIAGNOSIS — E08621 Diabetes mellitus due to underlying condition with foot ulcer: Secondary | ICD-10-CM | POA: Insufficient documentation

## 2020-11-02 DIAGNOSIS — L97519 Non-pressure chronic ulcer of other part of right foot with unspecified severity: Secondary | ICD-10-CM | POA: Insufficient documentation

## 2020-11-02 NOTE — Progress Notes (Signed)
  Subjective:  Patient ID: [name], male    DOB: [birth year],  MRN: [id number]  Chief Complaint  Patient presents with   Diabetic Ulcer    2 week follow up, diabetic ulcer right foot    55 y.o. male presents with the above complaint. History confirmed with patient.  Doing well has been using mupirocin ointment  Objective:  Physical Exam: warm, good capillary refill, no trophic changes or ulcerative lesions, and normal DP and PT pulses.  Full-thickness ulceration with exposed subcutaneous tissue 0.8x0.8 x 0.2 cm ulceration with no exposed bone tendon or joint.  Fully granular wound bed.  No signs of infection       Assessment:   1. Diabetic ulcer of right foot associated with diabetes mellitus due to underlying condition, unspecified part of foot, unspecified ulcer stage ([place])       Plan:  Patient was evaluated and treated and all questions answered.  Patient educated on diabetes. Discussed proper diabetic foot care and discussed risks and complications of disease. Educated patient in depth on reasons to return to the office immediately should he/she discover anything concerning or new on the feet. All questions answered. Discussed proper shoes as well.   Ulcer right foot -We discussed the etiology and factors that are a part of the wound healing process.  We also discussed the risk of infection both soft tissue and osteomyelitis from open ulceration.  Discussed the risk of limb loss if this happens or worsens. -Advised to stop smoking to allow for wound healing and we discussed how this negatively impacts at -He will change daily with mupirocin ointment this was prescribed for him -Continue off-loading with surgical shoe.  Procedure: Excisional Debridement of Wound Rationale: Removal of non-viable soft tissue from the wound to promote healing.  Anesthesia: none Post-Debridement Wound Measurements 0.8 x 0.8 x 0.3 cm Type of Debridement: Sharp Excisional Tissue Removed:  Non-viable soft tissue Depth of Debridement: subcutaneous tissue. Technique: Sharp excisional debridement to bleeding, viable wound base.  Dressing: Dry, sterile, compression dressing. Disposition: Patient tolerated procedure well.       Return in about 2 weeks (around 11/11/2020) for wound care.

## 2020-11-11 ENCOUNTER — Other Ambulatory Visit: Payer: Self-pay

## 2020-11-11 ENCOUNTER — Ambulatory Visit: Payer: Medicaid Other | Admitting: Podiatry

## 2020-11-11 DIAGNOSIS — L97519 Non-pressure chronic ulcer of other part of right foot with unspecified severity: Secondary | ICD-10-CM | POA: Diagnosis not present

## 2020-11-11 DIAGNOSIS — E08621 Diabetes mellitus due to underlying condition with foot ulcer: Secondary | ICD-10-CM

## 2020-11-11 NOTE — Progress Notes (Signed)
  Subjective:  Patient ID: [name], male    DOB: [birth year],  MRN: [id number]  Chief Complaint  Patient presents with   Diabetic Ulcer    wound care 2 weeks    55 y.o. male presents with the above complaint. History confirmed with patient.  Doing well has been using mupirocin ointment  Objective:  Physical Exam: warm, good capillary refill, no trophic changes or ulcerative lesions, and normal DP and PT pulses.  Full-thickness ulceration with exposed subcutaneous tissue 0.6x0.5 x 0.2 cm ulceration with no exposed bone tendon or joint.  Fully granular wound bed.  No signs of infection        Assessment:   1. Diabetic ulcer of right foot associated with diabetes mellitus due to underlying condition, unspecified part of foot, unspecified ulcer stage ([place])       Plan:  Patient was evaluated and treated and all questions answered.  Patient educated on diabetes. Discussed proper diabetic foot care and discussed risks and complications of disease. Educated patient in depth on reasons to return to the office immediately should he/she discover anything concerning or new on the feet. All questions answered. Discussed proper shoes as well.   Ulcer right foot -We discussed the etiology and factors that are a part of the wound healing process.  We also discussed the risk of infection both soft tissue and osteomyelitis from open ulceration.  Discussed the risk of limb loss if this happens or worsens. -Advised to stop smoking to allow for wound healing and we discussed how this negatively impacts at -He will change daily with mupirocin ointment  -Continue off-loading with surgical shoe.  Procedure: Excisional Debridement of Wound Rationale: Removal of non-viable soft tissue from the wound to promote healing.  Anesthesia: none Post-Debridement Wound Measurements 0.6x0.5 from 0.2 Type of Debridement: Sharp Excisional Tissue Removed: Non-viable soft tissue Depth of Debridement:  subcutaneous tissue. Technique: Sharp excisional debridement to bleeding, viable wound base.  Dressing: Dry, sterile, compression dressing. Disposition: Patient tolerated procedure well.       Return in about 3 weeks (around 12/02/2020) for wound care.

## 2020-12-14 ENCOUNTER — Ambulatory Visit: Payer: Medicaid Other | Admitting: Podiatry

## 2020-12-14 ENCOUNTER — Encounter (INDEPENDENT_AMBULATORY_CARE_PROVIDER_SITE_OTHER): Payer: Self-pay

## 2020-12-14 ENCOUNTER — Other Ambulatory Visit: Payer: Self-pay

## 2020-12-14 DIAGNOSIS — E08621 Diabetes mellitus due to underlying condition with foot ulcer: Secondary | ICD-10-CM | POA: Diagnosis not present

## 2020-12-14 DIAGNOSIS — L97519 Non-pressure chronic ulcer of other part of right foot with unspecified severity: Secondary | ICD-10-CM

## 2020-12-15 NOTE — Progress Notes (Signed)
  Subjective:  Patient ID: [name], male    DOB: 05/09/1965,  MRN: [id number]  Chief Complaint  Patient presents with   Diabetic Ulcer    4 week follow up diabetic ulcer right foot    55 y.o. male returns with the above complaint. History confirmed with patient.  Doing well has been using mupirocin ointment  Objective:  Physical Exam: warm, good capillary refill, no trophic changes or ulcerative lesions, and normal DP and PT pulses.  Full-thickness ulceration with exposed subcutaneous tissue 0.6x0.5 x 0.2 cm ulceration with no exposed bone tendon or joint.  Fully granular wound bed.  No signs of infection        Assessment:   1. Diabetic ulcer of right foot associated with diabetes mellitus due to underlying condition, unspecified part of foot, unspecified ulcer stage ([place])       Plan:  Patient was evaluated and treated and all questions answered.  Patient educated on diabetes. Discussed proper diabetic foot care and discussed risks and complications of disease. Educated patient in depth on reasons to return to the office immediately should he/she discover anything concerning or new on the feet. All questions answered. Discussed proper shoes as well.   Ulcer right foot -We discussed the etiology and factors that are a part of the wound healing process.  We also discussed the risk of infection both soft tissue and osteomyelitis from open ulceration.  Discussed the risk of limb loss if this happens or worsens. -Advised to stop smoking to allow for wound healing and we discussed how this negatively impacts at -He will change daily with mupirocin ointment  -Continue off-loading with surgical shoe.  Procedure: Excisional Debridement of Wound Rationale: Removal of non-viable soft tissue from the wound to promote healing.  Anesthesia: none Post-Debridement Wound Measurements 0.6x0.5 from 0.2 Type of Debridement: Sharp Excisional Tissue Removed: Non-viable soft  tissue Depth of Debridement: subcutaneous tissue. Technique: Sharp excisional debridement to bleeding, viable wound base.  Dressing: Dry, sterile, compression dressing. Disposition: Patient tolerated procedure well.       Return in about 2 weeks (around 12/28/2020) for wound care.

## 2020-12-23 ENCOUNTER — Emergency Department (HOSPITAL_COMMUNITY)
Admission: EM | Admit: 2020-12-23 | Discharge: 2020-12-24 | Disposition: A | Discharge status: Home / Self Care | Payer: Medicaid Other | Source: Home / Self Care | Attending: Emergency Medicine | Admitting: Emergency Medicine

## 2020-12-23 ENCOUNTER — Encounter: Payer: Self-pay | Admitting: Emergency Medicine

## 2020-12-23 ENCOUNTER — Ambulatory Visit: Payer: Medicaid Other | Admitting: Podiatry

## 2020-12-23 ENCOUNTER — Emergency Department
Admission: EM | Admit: 2020-12-23 | Discharge: 2020-12-23 | Disposition: A | Discharge status: Home / Self Care | Payer: Medicaid Other | Attending: Emergency Medicine | Admitting: Emergency Medicine

## 2020-12-23 ENCOUNTER — Other Ambulatory Visit: Payer: Self-pay

## 2020-12-23 ENCOUNTER — Emergency Department: Payer: Medicaid Other

## 2020-12-23 ENCOUNTER — Ambulatory Visit (INDEPENDENT_AMBULATORY_CARE_PROVIDER_SITE_OTHER): Payer: Medicaid Other

## 2020-12-23 DIAGNOSIS — L089 Local infection of the skin and subcutaneous tissue, unspecified: Secondary | ICD-10-CM | POA: Diagnosis not present

## 2020-12-23 DIAGNOSIS — E1140 Type 2 diabetes mellitus with diabetic neuropathy, unspecified: Secondary | ICD-10-CM | POA: Diagnosis not present

## 2020-12-23 DIAGNOSIS — E119 Type 2 diabetes mellitus without complications: Secondary | ICD-10-CM

## 2020-12-23 DIAGNOSIS — Z20822 Contact with and (suspected) exposure to covid-19: Secondary | ICD-10-CM | POA: Insufficient documentation

## 2020-12-23 DIAGNOSIS — E11621 Type 2 diabetes mellitus with foot ulcer: Secondary | ICD-10-CM | POA: Insufficient documentation

## 2020-12-23 DIAGNOSIS — L97412 Non-pressure chronic ulcer of right heel and midfoot with fat layer exposed: Secondary | ICD-10-CM

## 2020-12-23 DIAGNOSIS — Z7984 Long term (current) use of oral hypoglycemic drugs: Secondary | ICD-10-CM | POA: Insufficient documentation

## 2020-12-23 DIAGNOSIS — L97519 Non-pressure chronic ulcer of other part of right foot with unspecified severity: Secondary | ICD-10-CM

## 2020-12-23 DIAGNOSIS — L03031 Cellulitis of right toe: Secondary | ICD-10-CM | POA: Diagnosis not present

## 2020-12-23 DIAGNOSIS — H401190 Primary open-angle glaucoma, unspecified eye, stage unspecified: Secondary | ICD-10-CM

## 2020-12-23 DIAGNOSIS — E11628 Type 2 diabetes mellitus with other skin complications: Secondary | ICD-10-CM | POA: Diagnosis not present

## 2020-12-23 DIAGNOSIS — Z79899 Other long term (current) drug therapy: Secondary | ICD-10-CM | POA: Insufficient documentation

## 2020-12-23 DIAGNOSIS — F17210 Nicotine dependence, cigarettes, uncomplicated: Secondary | ICD-10-CM | POA: Insufficient documentation

## 2020-12-23 DIAGNOSIS — I10 Essential (primary) hypertension: Secondary | ICD-10-CM | POA: Insufficient documentation

## 2020-12-23 DIAGNOSIS — E08621 Diabetes mellitus due to underlying condition with foot ulcer: Secondary | ICD-10-CM | POA: Diagnosis not present

## 2020-12-23 DIAGNOSIS — G4733 Obstructive sleep apnea (adult) (pediatric): Secondary | ICD-10-CM | POA: Diagnosis not present

## 2020-12-23 DIAGNOSIS — M86171 Other acute osteomyelitis, right ankle and foot: Secondary | ICD-10-CM | POA: Diagnosis not present

## 2020-12-23 DIAGNOSIS — L97509 Non-pressure chronic ulcer of other part of unspecified foot with unspecified severity: Secondary | ICD-10-CM | POA: Diagnosis present

## 2020-12-23 DIAGNOSIS — T148XXA Other injury of unspecified body region, initial encounter: Secondary | ICD-10-CM

## 2020-12-23 DIAGNOSIS — R2241 Localized swelling, mass and lump, right lower limb: Secondary | ICD-10-CM | POA: Diagnosis present

## 2020-12-23 DIAGNOSIS — L02611 Cutaneous abscess of right foot: Secondary | ICD-10-CM

## 2020-12-23 HISTORY — DX: Type 2 diabetes mellitus without complications: E11.9

## 2020-12-23 HISTORY — DX: Type 2 diabetes mellitus with foot ulcer: L97.509

## 2020-12-23 HISTORY — DX: Type 2 diabetes mellitus with foot ulcer: E11.621

## 2020-12-23 HISTORY — DX: Tobacco use: Z72.0

## 2020-12-23 LAB — BASIC METABOLIC PANEL
Anion gap: 7 (ref 5–15)
BUN: 14 mg/dL (ref 6–20)
CO2: 25 mmol/L (ref 22–32)
Calcium: 9.5 mg/dL (ref 8.9–10.3)
Chloride: 103 mmol/L (ref 98–111)
Creatinine, Ser: 1.01 mg/dL (ref 0.61–1.24)
GFR, Estimated: >60 mL/min (ref >60)
Glucose, Bld: 146 mg/dL — ABNORMAL HIGH (ref 70–99)
Potassium: 4 mmol/L (ref 3.5–5.1)
Sodium: 135 mmol/L (ref 135–145)

## 2020-12-23 LAB — CBC WITH DIFFERENTIAL/PLATELET
Abs Immature Granulocytes: 0.05 10*3/uL (ref 0.00–0.07)
Basophils Absolute: 0.1 10*3/uL (ref 0.0–0.1)
Basophils Relative: 1 %
Eosinophils Absolute: 0.4 10*3/uL (ref 0.0–0.5)
Eosinophils Relative: 4 %
HCT: 42.8 % (ref 39.0–52.0)
Hemoglobin: 15.1 g/dL (ref 13.0–17.0)
Immature Granulocytes: 1 %
Lymphocytes Relative: 18 %
Lymphs Abs: 1.5 10*3/uL (ref 0.7–4.0)
MCH: 32.8 pg (ref 26.0–34.0)
MCHC: 35.3 g/dL (ref 30.0–36.0)
MCV: 93 fL (ref 80.0–100.0)
Monocytes Absolute: 0.6 10*3/uL (ref 0.1–1.0)
Monocytes Relative: 7 %
Neutro Abs: 6 10*3/uL (ref 1.7–7.7)
Neutrophils Relative %: 69 %
Platelets: 202 10*3/uL (ref 150–400)
RBC: 4.6 MIL/uL (ref 4.22–5.81)
RDW: 13.1 % (ref 11.5–15.5)
WBC: 8.6 10*3/uL (ref 4.0–10.5)
nRBC: 0 % (ref 0.0–0.2)

## 2020-12-23 LAB — LACTIC ACID, PLASMA: Lactic Acid, Venous: 1.4 mmol/L (ref 0.5–1.9)

## 2020-12-23 LAB — RESP PANEL BY RT-PCR (FLU A&B, COVID) ARPGX2
Influenza A by PCR: NEGATIVE
Influenza B by PCR: NEGATIVE
SARS Coronavirus 2 by RT PCR: NEGATIVE

## 2020-12-23 MED ORDER — SODIUM CHLORIDE 0.9 % IV SOLN
2.0000 g | Freq: Once | INTRAVENOUS | Status: AC
  Administered 2020-12-23: 2 g via INTRAVENOUS
  Filled 2020-12-23: qty 2

## 2020-12-23 MED ORDER — GADOBUTROL 1 MMOL/ML IV SOLN
10.0000 mL | Freq: Once | INTRAVENOUS | Status: AC | PRN
  Administered 2020-12-23: 10 mL via INTRAVENOUS
  Filled 2020-12-23: qty 10

## 2020-12-23 MED ORDER — METRONIDAZOLE 500 MG PO TABS
500.0000 mg | ORAL_TABLET | ORAL | Status: AC
  Administered 2020-12-24: 500 mg via ORAL
  Filled 2020-12-23: qty 1

## 2020-12-23 MED ORDER — VANCOMYCIN HCL 2000 MG/400ML IV SOLN
2000.0000 mg | Freq: Once | INTRAVENOUS | Status: DC
  Filled 2020-12-23: qty 400

## 2020-12-23 MED ORDER — VANCOMYCIN HCL 2000 MG/400ML IV SOLN
2000.0000 mg | Freq: Once | INTRAVENOUS | Status: AC
  Administered 2020-12-24: 2000 mg via INTRAVENOUS
  Filled 2020-12-23: qty 400

## 2020-12-23 NOTE — Discharge Instructions (Addendum)
Please return to the emergency department right away this evening for further evaluation including the recommended antibiotics and MRI of your right foot.  Our current understanding and plan is that you plan to leave the hospital in order to lock up your home and feed her animals and return for a planned admission.  It is extremely important that you return tonight and as soon as possible.  As we discussed delays in starting antibiotics or diagnosis of severe infection in your right foot could result in severe or disabling injury such as loss of toes loss of the foot severe debility or even worsening of symptoms and death.  Return to the emergency room as soon as plausible.  You are welcome to return at any time, but plan to return within an hour after attending to your home prior to needed admission.

## 2020-12-23 NOTE — ED Provider Notes (Signed)
Admitted to Dr. Posey Pronto (hospitalist)   Delman Kitten, MD 12/23/20 2132

## 2020-12-23 NOTE — Consult Note (Signed)
PHARMACY -  BRIEF ANTIBIOTIC NOTE   Pharmacy has received consult(s) for cefepime and Vancomycin from an ED provider.  The patient's profile has been reviewed for ht/wt/allergies/indication/available labs.    One time order(s) placed for  Cefepime 2 gram Vancomycin 2 gram  Further antibiotics/pharmacy consults should be ordered by admitting physician if indicated.                       Thank you, Dorothe Pea, PharmD, BCPS Clinical Pharmacist   12/23/2020  8:48 PM

## 2020-12-23 NOTE — ED Provider Notes (Signed)
[facility] Emergency Department Provider Note   ____________________________________________   Event Date/Time   First MD Initiated Contact with Patient 12/23/20 2028     (approximate)  I have reviewed the triage vital signs and the nursing notes.   HISTORY  Chief Complaint Wound Infection    HPI [name] is a 55 y.o. male diabetes diabetic foot infections  Patient presents for concerns of a right foot infection.  He came to the ER earlier left AMA has he had to return to his home to lock his house he got to see his doctor today but did not anticipate that he would have to stay in the hospital.  He now returns just an hour or 2 later.  He denies any changes in the interim he did go home he got his house locked up and he understands he came back for an MRI and needs to be admitted for antibiotics and to see his podiatrist Dr. Sherryle Lis.  Today he noticed a area over his right toe that was weeping with an ulcer developing he has a chronic ulcer of his right heel but today noticed an ulcer and swelling noticed around his smallest toe on his right foot  No fevers or chills no chest pains.  Reports that he has no feeling in his feet so it could have be that he has had this wound for a few days time but he did not recognize it  He saw his podiatrist Dr. Sherryle Lis today at the clinic who recommended he come to the ER  History reviewed. No pertinent past medical history.  Patient Active Problem List   Diagnosis Date Noted   Diabetic ulcer of right foot associated with diabetes mellitus due to underlying condition ([place]) 11/02/2020   Hyperlipidemia 06/27/2019   Encounter to establish care with new doctor 06/24/2019   Neuropathy 06/24/2019   Hallux malleus 04/18/2019   Recurrent major depressive disorder ([place]) 11/30/2017   Type 2 diabetes mellitus without complication, without long-term current use of insulin ([place]) 11/30/2017   Presence of intraocular  lens 09/05/2017   Spinal stenosis of lumbar region 06/14/2017   Primary open-angle glaucoma 11/30/2016   Hypersensitivity pneumonitis ([place]) 06/02/2016   Skin ulcer of left foot with fat layer exposed ([place]) 01/09/2015   History of skin ulcer of lower extremity 01/09/2015   Benign localized hyperplasia of prostate with urinary obstruction 10/07/2014   Erectile dysfunction 10/07/2014   Nocturia 10/07/2014   Shortness of breath 06/26/2014   Alopecia areata 01/15/2014   Increased frequency of urination 08/13/2013   HTN (hypertension) 07/04/2012   OSA (obstructive sleep apnea) 07/04/2012   PVC's (premature ventricular contractions) 07/04/2012   Eczema of lower extremity 07/04/2012   Ventricular premature depolarization 07/04/2012   Chronic airway obstruction ([place]) 01/12/2011   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 No pertinent surgical history.  Prior to Admission medications   Medication Sig Start Date End Date Taking? Authorizing Provider  acyclovir (ZOVIRAX) 400 MG tablet Take 400 mg by mouth 2 (two) times daily. 12/12/20   [provider]  albuterol (VENTOLIN HFA) 108 (90 Base) MCG/ACT inhaler Inhale into the lungs. 06/24/19 06/23/20  [provider]  buPROPion (WELLBUTRIN SR) 100 MG 12 hr tablet Take by mouth. 11/04/19 02/14/20  [provider]  citalopram (CELEXA) 20 MG tablet Take by mouth. 06/24/19 06/23/20  [provider]  dorzolamide-timolol (COSOPT) 22.3-6.8 MG/ML ophthalmic solution Apply to eye. 05/15/18   [provider]  fesoterodine (TOVIAZ) 4 MG TB24 tablet Take by mouth. 04/26/19   [provider]  gabapentin (NEURONTIN) 100 MG capsule Take by mouth. 06/24/19   [provider]  metFORMIN (GLUCOPHAGE-XR) 500 MG 24 hr tablet Take by mouth. 06/24/19 06/23/20  [provider]  methazolamide (NEPTAZANE) 50 MG tablet Take by mouth. 05/17/13   [provider]  mupirocin ointment (BACTROBAN) 2 % Apply 1  application topically daily. 10/21/20   McDonald, Stephan Minister, DPM  simvastatin (ZOCOR) 20 MG tablet Take by mouth. 06/24/19   [provider]  tamsulosin (FLOMAX) 0.4 MG CAPS capsule Take by mouth. 06/24/19   [provider]  tiotropium (SPIRIVA HANDIHALER) 18 MCG inhalation capsule Place into inhaler and inhale. 06/24/19   [provider]  trazodone (DESYREL) 300 MG tablet Take 300 mg by mouth at bedtime. 12/12/20   [provider]  verapamil (CALAN-SR) 240 MG CR tablet Take 240 mg by mouth 2 (two) times daily. 09/17/20   [provider]    Allergies Patient has no known allergies.  History reviewed. No pertinent family history.  Social History Social History   Tobacco Use   Smoking status: Every Day    Packs/day: 4.00    Types: Cigarettes   Smokeless tobacco: Never   Tobacco comments:    4/day, currently trying to quit  Substance Use Topics   Alcohol use: Yes    Comment: social drinker   Drug use: Never    Review of Systems Constitutional: No fever/chills Eyes: No visual changes. ENT: No sore throat. Cardiovascular: Denies chest pain. Respiratory: Denies shortness of breath. Gastrointestinal: No abdominal pain.   Genitourinary: Negative for dysuria. Musculoskeletal: Negative for back pain. Skin: Negative for rash. Neurological: Negative for headaches, areas of focal weakness or numbness.    ____________________________________________   PHYSICAL EXAM:  VITAL SIGNS: ED Triage Vitals  Enc Vitals Group     BP 12/23/20 2007 116/79     Pulse Rate 12/23/20 2007 65     Resp 12/23/20 2007 18     Temp 12/23/20 2007 97.7 F (36.5 C)     Temp Source 12/23/20 2007 Oral     SpO2 12/23/20 2007 99 %     Weight 12/23/20 2010 225 lb (102.1 kg)     Height 12/23/20 2010 6\' 2"  (1.88 m)     Head Circumference --      Peak Flow --      Pain Score 12/23/20 2009 0     Pain Loc --      Pain Edu? --      Excl. in [place]? --     Constitutional:  Alert and oriented. Well appearing and in no acute distress.  Sitting pleasantly at side of bed.  Eyes: Conjunctivae are normal. Head: Atraumatic. Nose: No congestion/rhinnorhea. Mouth/Throat: Mucous membranes are moist. Neck: No stridor.  Cardiovascular: Normal rate, regular rhythm. Grossly normal heart sounds.  Good peripheral circulation. Respiratory: Normal respiratory effort.  No retractions. Lungs CTAB. Gastrointestinal: Soft and nontender. No distention. Musculoskeletal: No lower extremity tenderness nor edema.  Palpable dorsalis pedis pulse right lower extremity.  Right lower foot and toes appear well perfused but there is an area of swelling erythema and some superficial skin loss and ulceration involving primarily the fifth toe. Neurologic:  Normal speech and language. No gross focal neurologic deficits are appreciated.  Skin:  Skin is warm, dry and intact Ceptaz noted regarding right foot. No rash noted. Psychiatric: Mood and affect are normal. Speech and  behavior are normal.  ____________________________________________   LABS (all labs ordered are listed, but only abnormal results are displayed)  Labs Reviewed  RESP PANEL BY RT-PCR (FLU A&B, COVID) [id number]   ____________________________________________  EKG   ____________________________________________  RADIOLOGY  MRI right foot pending, will be followed upon by podiatry team and hospitalist ____________________________________________   PROCEDURES  Procedure(s) performed: None  Procedures  Critical Care performed: No  ____________________________________________   INITIAL IMPRESSION / ASSESSMENT AND PLAN / ED COURSE  Pertinent labs & imaging results that were available during my care of the patient were reviewed by me and considered in my medical decision making (see chart for details).   Patient presents for right foot swelling and redness is particular on the right foot fifth digit.  Reports noticing  this morning.  He is a diabetic.  Patient has evidence of diabetic foot ulcer but also had x-ray at clinic with Dr. Sherryle Lis Dr. Sherryle Lis advise he is concerned about erosion and possible signs of osteomyelitis developing around the right fifth phalanx  ----------------------------------------- 8:45 PM on 12/23/2020 ----------------------------------------- Discussed case with Dr. Sherryle Lis of podiatry, he is in agreement with admission for IV antibiotics MRI and podiatry consult to occur in the morning.  Advises n.p.o. at midnight but regular diet okay now.  Patient very understanding of the plan above he is agreeable to plan for admission and appears appropriate for admission to the hospital service with ongoing consult and MRI pending        ____________________________________________   FINAL CLINICAL IMPRESSION(S) / ED DIAGNOSES  Final diagnoses:  Wound infection  Right foot, diabetic foot infection.  Concern for possible osteomyelitis      Note:  This document was prepared using Dragon voice recognition software and may include unintentional dictation errors       Delman Kitten, MD 12/23/20 2046

## 2020-12-23 NOTE — Progress Notes (Signed)
  Subjective:  Patient ID: [name], male    DOB: 1966-01-18,  MRN: [id number]  Chief Complaint  Patient presents with   Diabetic Ulcer    2 week follow up diabetic ulcer right    55 y.o. male returns with the above complaint. History confirmed with patient.  Presents for urgent visit with a new issue on the fifth toe  Objective:  Physical Exam: warm, good capillary refill, no trophic changes or ulcerative lesions, and normal DP and PT pulses.  Full-thickness ulceration with exposed subcutaneous tissue submetatarsal 1 0.6x0.5 x 0.2 cm ulceration with no exposed bone tendon or joint.  Fully granular wound bed.  New full-thickness ulceration of the lateral fifth toe with cellulitis      New multiple view radiographs of the right foot show erosion of the distal portion of the proximal phalanx Not present on prior films  Assessment:   1. Type 2 diabetes mellitus with right diabetic foot infection ([name])   2. Cellulitis and abscess of toe of right foot   3. Diabetic ulcer of right midfoot associated with type 2 diabetes mellitus, with fat layer exposed ([name])       Plan:  Patient was evaluated and treated and all questions answered.  Patient educated on diabetes. Discussed proper diabetic foot care and discussed risks and complications of disease. Educated patient in depth on reasons to return to the office immediately should he/she discover anything concerning or new on the feet. All questions answered. Discussed proper shoes as well.   Ulcer right foot -Unfortunately he has a new severe ulceration on the outside of the fifth toe.  He has new erosions on his x-rays of the fifth toe proximal phalanx.  My chief concern would be septic arthritis as well as osteomyelitis of the fifth toe.  I recommended he go to the emergency room for admission for IV antibiotics MRI and possible surgical intervention.  Discussed with him he may end up losing the toe if there is osteomyelitis or  infection.     Return for after hospital .

## 2020-12-23 NOTE — ED Provider Notes (Cosign Needed)
Emergency Medicine Provider Triage Evaluation Note  [name] , a 55 y.o. male  was evaluated in triage.  Pt complains of right 5th toe cellulitis and concern for osteomyelitis. He presents from [facility] with request for MRI and IV abs. Patient denies pain due to DM neuropathy. He notes a weeping wound ot the lateral right foot  Review of Systems  Positive: Cellulitis, osteomyelitis Negative: FCS  Physical Exam  BP (!) 144/89   Pulse 61   Temp 97.6 F (36.4 C) (Oral)   Resp 19   Ht 6\' 2"  (1.88 m)   Wt 102.1 kg   SpO2 96%   BMI 28.89 kg/m  Gen:   Awake, no distress  NAD Resp:  Normal effort CTA MSK:   Moves extremities without difficulty R foot with lateral cellulitis Other:  CVS: RRR  Medical Decision Making  Medically screening exam initiated at 3:52 PM.  Appropriate orders placed.  [name] was informed that the remainder of the evaluation will be completed by another provider, this initial triage assessment does not replace that evaluation, and the importance of remaining in the ED until their evaluation is complete.  Patient with ED evaluation of RLE osteomyelitis.    [name] Needles, PA-C 12/23/20 1555

## 2020-12-23 NOTE — ED Triage Notes (Signed)
Pt here earlier, here for infected diabetic foot ulcer on the R, pt had been seen by PA-C in triage, pt left before any further work by MD was completed, Pt states he had to leave to feed animals. Advised pt that his spot was not held for him and that he would have to start the process over.

## 2020-12-23 NOTE — ED Triage Notes (Signed)
Pt comes pov from podiatry with poss osteomylitis from diabetic foot ulcer. See podiatry note. Orders placed by [facility] for sepsis work up. First set of cultures sent.

## 2020-12-23 NOTE — H&P (Signed)
History and Physical    [name] RPR:[id number] DOB: 01-30-1966 DOA: 12/23/2020  PCP: Marijean Heath, PA-C    Patient coming from:  Podiatry office Dr. Sherryle Lis   Chief Complaint:  Diabetic foot infection of right foot.    HPI: [name] is a 55 y.o. male seen in ed with complaints of  right foot pain found fifth MTP wound with concerns for osteomyelitis patient sent to the emergency room. Pt is a poor historian, reports that he felt his toe after removing dressing and there was sticky secretion so he went to his podiatrist who sent him here. Does not know how long his toe has been red or draining due to this neuropathy. Pt denies any chest pain sob palpitation.   Pt has past medical history of diabetic ulcer of the right foot, hyperlipidemia, neuropathy, diabetes mellitus type 2, glaucoma, shortness of breath, hypertension, sleep apnea, eczema, tobacco abuse. ED Course:  Vitals:   12/23/20 2007 12/23/20 2010  BP: 116/79   Pulse: 65   Resp: 18   Temp: 97.7 F (36.5 C)   TempSrc: Oral   SpO2: 99%   Weight:  102.1 kg  Height:  6\' 2"  (1.88 m)  In ed pt is alert,awake and oriented and stable, no white count, dr Sherryle Lis is going to see pt and was given vancomycin and cefepime and flagyl.labs shows glucose of 146, CBC is normal Normal creatinine and lft. Podiatry wants Mri and cont iv abx.   Review of Systems:  Review of Systems  Constitutional: Negative.   HENT: Negative.    Eyes: Negative.   Respiratory: Negative.    Cardiovascular: Negative.   Gastrointestinal: Negative.   Neurological: Negative.   Psychiatric/Behavioral: Negative.    All other systems reviewed and are negative.   History reviewed. No pertinent past medical history.  History reviewed. No pertinent surgical history.   reports that he has been smoking cigarettes. He has been smoking an average of 4 packs per day. He has never used smokeless tobacco. He reports current alcohol use. He  reports that he does not use drugs.  No Known Allergies  Family History  Problem Relation Age of Onset   Diabetes Mother    Hypertension Father     Prior to Admission medications   Medication Sig Start Date End Date Taking? Authorizing Provider  acyclovir (ZOVIRAX) 400 MG tablet Take 400 mg by mouth 2 (two) times daily. 12/12/20   [provider]  albuterol (VENTOLIN HFA) 108 (90 Base) MCG/ACT inhaler Inhale into the lungs. 06/24/19 06/23/20  [provider]  buPROPion (WELLBUTRIN SR) 100 MG 12 hr tablet Take by mouth. 11/04/19 02/14/20  [provider]  citalopram (CELEXA) 20 MG tablet Take by mouth. 06/24/19 06/23/20  [provider]  dorzolamide-timolol (COSOPT) 22.3-6.8 MG/ML ophthalmic solution Apply to eye. 05/15/18   [provider]  fesoterodine (TOVIAZ) 4 MG TB24 tablet Take by mouth. 04/26/19   [provider]  gabapentin (NEURONTIN) 100 MG capsule Take by mouth. 06/24/19   [provider]  metFORMIN (GLUCOPHAGE-XR) 500 MG 24 hr tablet Take by mouth. 06/24/19 06/23/20  [provider]  methazolamide (NEPTAZANE) 50 MG tablet Take by mouth. 05/17/13   [provider]  mupirocin ointment (BACTROBAN) 2 % Apply 1 application topically daily. 10/21/20   McDonald, Stephan Minister, DPM  simvastatin (ZOCOR) 20 MG tablet Take by mouth. 06/24/19   [provider]  tamsulosin (FLOMAX) 0.4 MG CAPS capsule Take by mouth. 06/24/19  [provider]  tiotropium (SPIRIVA HANDIHALER) 18 MCG inhalation capsule Place into inhaler and inhale. 06/24/19   [provider]  trazodone (DESYREL) 300 MG tablet Take 300 mg by mouth at bedtime. 12/12/20   [provider]  verapamil (CALAN-SR) 240 MG CR tablet Take 240 mg by mouth 2 (two) times daily. 09/17/20   [provider]    Physical Exam: Vitals:   12/23/20 2007 12/23/20 2010  BP: 116/79   Pulse: 65   Resp: 18   Temp: 97.7 F (36.5 C)   TempSrc: Oral    SpO2: 99%   Weight:  102.1 kg  Height:  6\' 2"  (1.88 m)   Physical Exam Vitals reviewed.  Constitutional:      General: He is not in acute distress.    Appearance: He is not ill-appearing.  HENT:     Head: Normocephalic and atraumatic.     Right Ear: External ear normal.     Left Ear: External ear normal.     Nose: Nose normal.  Eyes:     Extraocular Movements: Extraocular movements intact.     Comments: Blind in right eye.   Cardiovascular:     Rate and Rhythm: Normal rate and regular rhythm.     Pulses: Normal pulses.          Dorsalis pedis pulses are 2+ on the right side and 2+ on the left side.       Posterior tibial pulses are 2+ on the right side and 2+ on the left side.     Heart sounds: Normal heart sounds.  Pulmonary:     Effort: Pulmonary effort is normal.     Breath sounds: Normal breath sounds.  Abdominal:     General: Bowel sounds are normal. There is no distension.     Palpations: Abdomen is soft. There is no mass.     Tenderness: There is no abdominal tenderness. There is no guarding.     Hernia: No hernia is present.  Musculoskeletal:     Right lower leg: No edema.     Left lower leg: No edema.     Left foot: Normal range of motion.       Feet:  Feet:     Right foot:     Skin integrity: Skin breakdown, erythema and warmth present.  Skin:    General: Skin is warm.  Neurological:     General: No focal deficit present.     Mental Status: He is alert and oriented to person, place, and time.  Psychiatric:        Mood and Affect: Mood normal.        Behavior: Behavior normal.     Labs on Admission: I have personally reviewed following labs and imaging studies  No results for input(s): CKTOTAL, CKMB, TROPONINI in the last 72 hours. Lab Results  Component Value Date   WBC 8.6 12/23/2020   HGB 15.1 12/23/2020   HCT 42.8 12/23/2020   MCV 93.0 12/23/2020   PLT 202 12/23/2020    Recent Labs  Lab 12/23/20 1537  NA 135  K 4.0  CL 103  CO2 25   BUN 14  CREATININE 1.01  CALCIUM 9.5  GLUCOSE 146*   No results found for: CHOL, HDL, LDLCALC, TRIG No results found for: DDIMER Invalid input(s): POCBNP  Urinalysis No results found for: COLORURINE, APPEARANCEUR, LABSPEC, PHURINE, GLUCOSEU, HGBUR, BILIRUBINUR, KETONESUR, PROTEINUR, UROBILINOGEN, NITRITE, LEUKOCYTESUR  COVID-19 Labs No results for input(s): DDIMER, FERRITIN,  LDH, CRP in the last 72 hours.  Lab Results  Component Value Date   [place] 12/23/2020   [place] Not Detected 09/27/2018    Radiological Exams on Admission: DG Foot Complete Right  Result Date: 12/23/2020 Please see detailed radiograph report in office note.   EKG: Independently reviewed.  None   Assessment/Plan Principal Problem:   Diabetic ulcer of right foot associated with diabetes mellitus due to underlying condition (HCC) Active Problems:   Type 2 diabetes mellitus without complication, without long-term current use of insulin (HCC)   HTN (hypertension)   OSA (obstructive sleep apnea)   Primary open-angle glaucoma    DFU: We will admit to med surg and cont iv abx and obtain MRI for concern of IV and d/c once c/s able and podiatry consult to [place] per am team.  Wound care team.   DM II; Cont ssi/glycemic protocol.    HTN: Blood pressure 116/79, pulse 65, temperature 97.7 F (36.5 C), temperature source Oral, resp. rate 18, height 6\' 2"  (1.88 m), weight 102.1 kg, SpO2 99 %. BP is under control and verapamil continued.   OSA: Cpap per home settings.   Glaucoma: Cont metholazmide / alphagan. Cosopt once dose is available.        DVT prophylaxis:  Heparin    Code Status:  Full code    Family Communication:  Garver,PEGGY M (Relative)  [phone] (Home Phone)   Disposition Plan:  Home    Consults called:  Podiatry- [place].   Admission status: Inpatient     Para Skeans MD [facility] [phone] How to contact the [facility]  Attending or Consulting provider [place] or covering provider during after hours [place], for this patient.    Check the care team in [facility] and look for a) attending/consulting TR[facility] provider listed and b) the [facility] team listed Log into [email or web address] and use [place]'s universal password to access. If you do not have the password, please contact the hospital operator. Locate the [facility] provider you are looking for under [facility] and page to a number that you can be directly reached. If you still have difficulty reaching the provider, please page the [facility] (Director on Call) for the Hospitalists listed on amion for assistance. [email or web address] Password [facility] 12/24/2020, 12:39 AM

## 2020-12-23 NOTE — ED Provider Notes (Signed)
[facility] Emergency Department Provider Note   ____________________________________________   Event Date/Time   First MD Initiated Contact with Patient 12/23/20 1801     (approximate)  I have reviewed the triage vital signs and the nursing notes.   HISTORY  Chief Complaint Cellulitis    HPI AB LEAMING is a 55 y.o. male here for evaluation of right foot infection.  Patient reports this morning he noticed his right foot had an ulcer on it went and saw his podiatrist today Dr. Sherryle Lis.  Dr. Sherryle Lis referred him to the ER for concerns that his x-ray shows deep infection possibly in his bone.  After entering the patient's room within just a brief.  He tells me that he actually wants to leave and come back.  He has animals to 10 to at his house and needs to lock his house up and prepared if he needs to be in the hospital.  Patient requesting to be discharged.  He does assure me though that if he leaves he will come right back to get the rest of his testing done though he was not certain if his doctor wanted him admitted.  However I was able to reach Dr. Sherryle Lis his podiatrist and we will to have a conversation over the phone directly with patient and Dr. Sherryle Lis and patient understands plan is that he needs an MRI to be admitted for antibiotics and podiatry consult for concerns of bone infection or osteomyelitis.  After discussing this the patient is agreeable to leaving the hospital [place] but plans to come back within about an hour after he wraps up things at his house that he needs to prepare prior to admission.  States to both myself and Dr. Sherryle Lis he does not have anyone else who can assist him with this History reviewed. No pertinent past medical history.  Patient Active Problem List   Diagnosis Date Noted   Diabetic ulcer of right foot associated with diabetes mellitus due to underlying condition ([place]) 11/02/2020    Hyperlipidemia 06/27/2019   Encounter to establish care with new doctor 06/24/2019   Neuropathy 06/24/2019   Hallux malleus 04/18/2019   Recurrent major depressive disorder ([place]) 11/30/2017   Type 2 diabetes mellitus without complication, without long-term current use of insulin ([place]) 11/30/2017   Presence of intraocular lens 09/05/2017   Spinal stenosis of lumbar region 06/14/2017   Primary open-angle glaucoma 11/30/2016   Hypersensitivity pneumonitis ([place]) 06/02/2016   Skin ulcer of left foot with fat layer exposed ([place]) 01/09/2015   History of skin ulcer of lower extremity 01/09/2015   Benign localized hyperplasia of prostate with urinary obstruction 10/07/2014   Erectile dysfunction 10/07/2014   Nocturia 10/07/2014   Shortness of breath 06/26/2014   Alopecia areata 01/15/2014   Increased frequency of urination 08/13/2013   HTN (hypertension) 07/04/2012   OSA (obstructive sleep apnea) 07/04/2012   PVC's (premature ventricular contractions) 07/04/2012   Eczema of lower extremity 07/04/2012   Ventricular premature depolarization 07/04/2012   Chronic airway obstruction ([place]) 01/12/2011   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 No pertinent surgical history.  Prior to Admission medications   Medication Sig Start Date End Date Taking? Authorizing Provider  acyclovir (ZOVIRAX) 400 MG tablet Take 400 mg by mouth 2 (two) times daily. 12/12/20   [provider]  albuterol (VENTOLIN HFA) 108 (90 Base) MCG/ACT inhaler Inhale into the lungs. 06/24/19 06/23/20  [provider]  buPROPion (WELLBUTRIN SR) 100 MG  12 hr tablet Take by mouth. 11/04/19 02/14/20  [provider]  citalopram (CELEXA) 20 MG tablet Take by mouth. 06/24/19 06/23/20  [provider]  dorzolamide-timolol (COSOPT) 22.3-6.8 MG/ML ophthalmic solution Apply to eye. 05/15/18   [provider]  fesoterodine (TOVIAZ) 4 MG TB24 tablet Take by mouth. 04/26/19   [provider]   gabapentin (NEURONTIN) 100 MG capsule Take by mouth. 06/24/19   [provider]  metFORMIN (GLUCOPHAGE-XR) 500 MG 24 hr tablet Take by mouth. 06/24/19 06/23/20  [provider]  methazolamide (NEPTAZANE) 50 MG tablet Take by mouth. 05/17/13   [provider]  mupirocin ointment (BACTROBAN) 2 % Apply 1 application topically daily. 10/21/20   McDonald, Stephan Minister, DPM  simvastatin (ZOCOR) 20 MG tablet Take by mouth. 06/24/19   [provider]  tamsulosin (FLOMAX) 0.4 MG CAPS capsule Take by mouth. 06/24/19   [provider]  tiotropium (SPIRIVA HANDIHALER) 18 MCG inhalation capsule Place into inhaler and inhale. 06/24/19   [provider]  trazodone (DESYREL) 300 MG tablet Take 300 mg by mouth at bedtime. 12/12/20   [provider]  verapamil (CALAN-SR) 240 MG CR tablet Take 240 mg by mouth 2 (two) times daily. 09/17/20   [provider]    Allergies Patient has no known allergies.  History reviewed. No pertinent family history.  Social History Social History   Tobacco Use   Smoking status: Every Day    Packs/day: 4.00    Types: Cigarettes   Smokeless tobacco: Never   Tobacco comments:    4/day, currently trying to quit  Substance Use Topics   Alcohol use: Yes    Comment: social drinker   Drug use: Never    Review of Systems Constitutional: No fever/chills Cardiovascular: Denies chest pain. Respiratory: Denies shortness of breath. Gastrointestinal: No abdominal pain.   Musculoskeletal: Negative for back pain. Skin: Negative for rash except regarding right foot especially the small toe of the right foot. Neurological: Negative for headaches, areas of focal weakness or numbness.  Does report he has no sensation in his feet but that is chronic due to his    ____________________________________________   PHYSICAL EXAM:  VITAL SIGNS: ED Triage Vitals  Enc Vitals Group     BP 12/23/20 1532 (!) 144/89     Pulse Rate  12/23/20 1532 61     Resp 12/23/20 1532 19     Temp 12/23/20 1532 97.6 F (36.4 C)     Temp Source 12/23/20 1532 Oral     SpO2 12/23/20 1532 96 %     Weight 12/23/20 1536 225 lb (102.1 kg)     Height 12/23/20 1536 6\' 2"  (1.88 m)     Head Circumference --      Peak Flow --      Pain Score 12/23/20 1535 0     Pain Loc --      Pain Edu? --      Excl. in [place]? --     Constitutional: Alert and oriented. Well appearing and in no acute distress. Eyes: Conjunctivae are normal. Head: Atraumatic. Neck: No stridor.  Cardiovascular: Normal rate, regular rhythm. Grossly normal heart sounds.  Good peripheral circulation. Respiratory: Normal respiratory effort.  No retractions. Lungs CTAB. Gastrointestinal: Soft and nontender. No distention. Musculoskeletal: No lower extremity tenderness but does have that area of ulceration and slight erythema surrounding the right foot fifth digit.  It appears erythematous and slightly swollen.  There is also a somewhat superficial ulceration  of the skin overlying the fifth toe on R. Neurologic:  Normal speech and language. No gross focal neurologic deficits are appreciated.  Stocking glove neuropathy of the feet Skin:  Skin is warm, dry and intact. No rash noted. Psychiatric: Mood and affect are normal. Speech and behavior are normal.  ____________________________________________   LABS (all labs ordered are listed, but only abnormal results are displayed)  Labs Reviewed  BASIC METABOLIC PANEL - Abnormal; Notable for the following components:      Result Value   Glucose, Bld 146 (*)    All other components within normal limits  CULTURE, BLOOD (ROUTINE X 2)  CULTURE, BLOOD (ROUTINE X 2)  CBC WITH DIFFERENTIAL/PLATELET  LACTIC ACID, PLASMA  LACTIC ACID, PLASMA   ____________________________________________  EKG   ____________________________________________  RADIOLOGY   ____________________________________________   PROCEDURES  Procedure(s)  performed: None  Procedures  Critical Care performed: No  ____________________________________________   INITIAL IMPRESSION / ASSESSMENT AND PLAN / ED COURSE  Pertinent labs & imaging results that were available during my care of the patient were reviewed by me and considered in my medical decision making (see chart for details).   Wishes to leave [place].  He clearly understands and voices a plan to return and understands the risk of infection involving his right foot also that he by delaying care could lead to worsening of his condition and worsening of infection, and potential loss of limb or severe infection.  Patient understands agreeable with this but also patient is very clear with the plan to go home lock his house 10 to his animals and return to the ER to complete evaluation with a plan for admission as guided by Dr. Sherryle Lis  Patient agreeable to discharge [place], plans to return to the ER in about 1 to 2 hours when he is able after getting his house in order so that he can be admitted.  Advises he does not have anyone else who can help him with this at the time.  Patient has capacity, he is alert well oriented understanding of directions, and they clearly outlines that he will be leaving but then coming back to the ER.  He understands this will be a second visit  I discussed the case with the patient as well as Dr. Sherryle Lis on the phone simultaneously, and patient affirms plan.  I did certainly encourage him to stay obtain MRI and further evaluation but patient did make decision to leave AMA however does give me confident thought that he will return promptly once he is able to get his home situation in order to the point he would be able to be admitted to the hospital      ____________________________________________   FINAL CLINICAL IMPRESSION(S) / ED DIAGNOSES  Final diagnoses:  Acute osteomyelitis of right foot ([place])  Suspected        Note:  This document was prepared using Dragon voice recognition software and may include unintentional dictation errors       Delman Kitten, MD 12/23/20 2328

## 2020-12-24 ENCOUNTER — Encounter: Payer: Self-pay | Admitting: Internal Medicine

## 2020-12-24 ENCOUNTER — Ambulatory Visit: Payer: Medicaid Other | Admitting: Podiatry

## 2020-12-24 DIAGNOSIS — M86171 Other acute osteomyelitis, right ankle and foot: Secondary | ICD-10-CM | POA: Diagnosis not present

## 2020-12-24 DIAGNOSIS — E119 Type 2 diabetes mellitus without complications: Secondary | ICD-10-CM

## 2020-12-24 LAB — C-REACTIVE PROTEIN: CRP: 1.5 mg/dL — ABNORMAL HIGH (ref <1.0)

## 2020-12-24 LAB — HEMOGLOBIN A1C
Hgb A1c MFr Bld: 5.9 % — ABNORMAL HIGH (ref 4.8–5.6)
Mean Plasma Glucose: 123 mg/dL

## 2020-12-24 LAB — PREALBUMIN: Prealbumin: 23.3 mg/dL (ref 18–38)

## 2020-12-24 LAB — HIV ANTIBODY (ROUTINE TESTING W REFLEX): HIV Screen 4th Generation wRfx: NONREACTIVE

## 2020-12-24 LAB — SEDIMENTATION RATE: Sed Rate: 8 mm/hr (ref 0–20)

## 2020-12-24 LAB — CBG MONITORING, ED: Glucose-Capillary: 123 mg/dL — ABNORMAL HIGH (ref 70–99)

## 2020-12-24 SURGERY — AMPUTATION, TOE
Anesthesia: Choice | Site: Toe | Laterality: Right

## 2020-12-24 MED ORDER — METHAZOLAMIDE 25 MG PO TABS
50.0000 mg | ORAL_TABLET | Freq: Two times a day (BID) | ORAL | Status: DC
  Filled 2020-12-24: qty 2

## 2020-12-24 MED ORDER — SODIUM CHLORIDE 0.9 % IV SOLN: INTRAVENOUS | Status: DC

## 2020-12-24 MED ORDER — INSULIN ASPART 100 UNIT/ML IJ SOLN: 0.0000 [IU] | Freq: Three times a day (TID) | INTRAMUSCULAR | Status: DC

## 2020-12-24 MED ORDER — FESOTERODINE FUMARATE ER 4 MG PO TB24
4.0000 mg | ORAL_TABLET | Freq: Every day | ORAL | Status: DC
  Filled 2020-12-24: qty 1

## 2020-12-24 MED ORDER — MUPIROCIN 2 % EX OINT
1.0000 "application " | TOPICAL_OINTMENT | Freq: Every day | CUTANEOUS | Status: DC
  Filled 2020-12-24: qty 22

## 2020-12-24 MED ORDER — SIMVASTATIN 10 MG PO TABS: 20.0000 mg | ORAL_TABLET | Freq: Every day | ORAL | Status: DC

## 2020-12-24 MED ORDER — VANCOMYCIN HCL 1500 MG/300ML IV SOLN
1500.0000 mg | Freq: Two times a day (BID) | INTRAVENOUS | Status: DC
  Filled 2020-12-24: qty 300

## 2020-12-24 MED ORDER — ALBUTEROL SULFATE HFA 108 (90 BASE) MCG/ACT IN AERS: 1.0000 | INHALATION_SPRAY | Freq: Four times a day (QID) | RESPIRATORY_TRACT | Status: DC | PRN

## 2020-12-24 MED ORDER — TRAZODONE HCL 100 MG PO TABS: 300.0000 mg | ORAL_TABLET | Freq: Every day | ORAL | Status: DC

## 2020-12-24 MED ORDER — LORAZEPAM 0.5 MG PO TABS
0.5000 mg | ORAL_TABLET | Freq: Once | ORAL | Status: AC
  Administered 2020-12-24: 0.5 mg via ORAL
  Filled 2020-12-24: qty 1

## 2020-12-24 MED ORDER — VERAPAMIL HCL ER 240 MG PO TBCR
240.0000 mg | EXTENDED_RELEASE_TABLET | Freq: Two times a day (BID) | ORAL | Status: DC
  Filled 2020-12-24: qty 1

## 2020-12-24 MED ORDER — ALBUTEROL SULFATE (2.5 MG/3ML) 0.083% IN NEBU: 2.5000 mg | INHALATION_SOLUTION | RESPIRATORY_TRACT | Status: DC | PRN

## 2020-12-24 MED ORDER — TAMSULOSIN HCL 0.4 MG PO CAPS: 0.4000 mg | ORAL_CAPSULE | Freq: Every day | ORAL | Status: DC

## 2020-12-24 MED ORDER — DORZOLAMIDE HCL-TIMOLOL MAL 2-0.5 % OP SOLN
1.0000 [drp] | Freq: Two times a day (BID) | OPHTHALMIC | Status: DC
  Filled 2020-12-24: qty 10

## 2020-12-24 MED ORDER — BUPROPION HCL ER (SR) 100 MG PO TB12
100.0000 mg | ORAL_TABLET | Freq: Two times a day (BID) | ORAL | Status: DC
  Filled 2020-12-24: qty 1

## 2020-12-24 MED ORDER — ACYCLOVIR 200 MG PO CAPS
400.0000 mg | ORAL_CAPSULE | Freq: Two times a day (BID) | ORAL | Status: DC
  Filled 2020-12-24: qty 2

## 2020-12-24 MED ORDER — TIOTROPIUM BROMIDE MONOHYDRATE 18 MCG IN CAPS
18.0000 ug | ORAL_CAPSULE | Freq: Every day | RESPIRATORY_TRACT | Status: DC
  Filled 2020-12-24: qty 5

## 2020-12-24 MED ORDER — SODIUM CHLORIDE 0.9 % IV SOLN: 2.0000 g | Freq: Three times a day (TID) | INTRAVENOUS | Status: DC

## 2020-12-24 MED ORDER — HEPARIN SODIUM (PORCINE) 5000 UNIT/ML IJ SOLN
5000.0000 [IU] | Freq: Three times a day (TID) | INTRAMUSCULAR | Status: DC
  Administered 2020-12-24: 5000 [IU] via SUBCUTANEOUS
  Filled 2020-12-24: qty 1

## 2020-12-24 MED ORDER — GABAPENTIN 100 MG PO CAPS: 100.0000 mg | ORAL_CAPSULE | Freq: Two times a day (BID) | ORAL | Status: DC

## 2020-12-24 MED ORDER — BRIMONIDINE TARTRATE 0.2 % OP SOLN
1.0000 [drp] | Freq: Two times a day (BID) | OPHTHALMIC | Status: DC
  Filled 2020-12-24: qty 5

## 2020-12-24 MED ORDER — NICOTINE 14 MG/24HR TD PT24: 14.0000 mg | MEDICATED_PATCH | Freq: Every day | TRANSDERMAL | Status: DC

## 2020-12-24 MED ORDER — HYDRALAZINE HCL 20 MG/ML IJ SOLN: 10.0000 mg | Freq: Four times a day (QID) | INTRAMUSCULAR | Status: DC | PRN

## 2020-12-24 MED ORDER — CITALOPRAM HYDROBROMIDE 20 MG PO TABS: 20.0000 mg | ORAL_TABLET | Freq: Every day | ORAL | Status: DC

## 2020-12-24 NOTE — ED Notes (Signed)
Pt signed printed AMA form, this RN witnessed. Pt ao x 4, NAD, ambulatory.

## 2020-12-24 NOTE — [place] Notes (Addendum)
MD Patel instructed to give flagyl 500 tablet and ativan 0.5 mg oral once then place pt NPO. Will continue to monitor.

## 2020-12-24 NOTE — ED Notes (Addendum)
Pt called this RN and said he wants to leave now and get a second opinion at [facility] after talking to podiatry this morning. Asked RN to take out his IV now. Pt verbalized understanding of the risks leaving AMA. Dr. [place] notified, MD acknowledged.

## 2020-12-24 NOTE — Consult Note (Signed)
  Subjective:  Patient ID: [name], male    DOB: 03-08-1966,  MRN: [id number]  A 55 y.o. male presents with past medical history of hyperlipidemia neuropathy diabetes glaucoma hypertension tobacco abuse with right fifth digit osteomyelitis .  Patient sent to the emergency room. He was sent in by Dr. Sherryle Lis. He states that it has been being managed by Dr. Sherryle Lis. He denies any pain to the area.  Objective:   Vitals:   12/24/20 0545 12/24/20 0734  BP: 114/73 125/80  Pulse: 72 63  Resp: 20 16  Temp: 97.7 F (36.5 C)   SpO2: 95% 99%   General AA&O x3. Normal mood and affect.  Vascular Dorsalis pedis and posterior tibial pulses 2/4 bilat. Brisk capillary refill to all digits. Pedal hair present.  Neurologic Epicritic sensation grossly intact.  Dermatologic Right fifth digit ulceration on the lateral aspect probing down to bone.  Redness associated with the fifth digit.  No purulent drainage was expressed.  Mild malodor present.  Orthopedic: MMT 5/5 in dorsiflexion, plantarflexion, inversion, and eversion. Normal joint ROM without pain or crepitus.     MRI was obtained and shown 1. Small open wound along the lateral aspect of the fifth PIP joint with underlying osteomyelitis involving the proximal and middle phalanges of the fifth digit. Probable septic arthritis at the PIP joint. 2. Cellulitis and myofasciitis without findings for drainable abscess. 3. Moderate degenerative changes at the first MTP joint. Assessment & Plan:  Patient was evaluated and treated and all questions answered.  Right fifth digit osteomyelitis -All questions and concerns were addressed at bedside today by me. -I discussed with the patient given the MRI findings of osteomyelitis of the fifth digit patient will benefit from 1/5 digit amputation. -I discussed in extensive length that the infection can spread and could end up costing more than just the digit and possibly the foot or the leg.   Patient states understanding however he would like to get a second opinion at a different facility/[place].  I discussed with the patient that that is his right to obtain a second opinion however our recommendation is surgical amputation and IV antibiotics with local wound care.  Patient states understanding and would like to leave AMA to get a second opinion at [facility]. -I discussed local wound care with Betadine wet-to-dry dressing changes until he safely reaches to another facility -I once again discussed with him that he could lose his foot or leg if the infection spreads.  He states understanding would like to leave despite that. -He can be discharged on p.o. antibiotics if he is amenable. -Podiatry to sign off.  Felipa Furnace, DPM  Accessible via secure chat for questions or concerns.

## 2020-12-24 NOTE — Progress Notes (Signed)
Pharmacy Antibiotic Note  [name] is a 55 y.o. male admitted on 12/23/2020 with  osteomyelitis .  Pharmacy has been consulted for Vancomycin , Cefepime  dosing.  Plan: Cefepime 2 gm IV X 1 given in ED on 10/12 @ 2359. Cefepime 2 gm IV Q8H ordered to continue on 10/13 @ 0800.  Vancomycin 2 gm IV X 1 given in ED on 10/13 @ 0053. Vancomycin 1500 mg IV Q12H ordered to continue on 10/13 @ 1300.  AUC = 496 Vanc trough = 13.7   Height: 6\' 2"  (188 cm) Weight: 102.1 kg (225 lb) IBW/kg (Calculated) : 82.2  Temp (24hrs), Avg:97.7 F (36.5 C), Min:97.6 F (36.4 C), Max:97.7 F (36.5 C)  Recent Labs  Lab 12/23/20 1537  WBC 8.6  CREATININE 1.01  LATICACIDVEN 1.4    Estimated Creatinine Clearance: 103 mL/min (by C-G formula based on SCr of 1.01 mg/dL).    No Known Allergies  Antimicrobials this admission:   >>    >>   Dose adjustments this admission:   Microbiology results:  BCx:   UCx:    Sputum:    MRSA PCR:   Thank you for allowing pharmacy to be a part of this patient's care.  [name] D 12/24/2020 1:22 AM

## 2020-12-24 NOTE — Discharge Summary (Signed)
[facility] Physician Discharge Summary  [name] JQB:[id number] DOB: 1965/12/11 DOA: 12/23/2020  PCP: Doroteo Glassman, Hillary A, PA-C  Admit date: 12/23/2020 Discharge date: 12/24/2020  Admitted From: Home   Disposition: Patient left AMA  History of present illness:  [name] is a 55 year old male with past medical history significant for type 2 diabetes mellitus, hyperlipidemia, HTN, OSA, tobacco use disorder who presents to [facility] ED on 10/12 by direction of his podiatrist for new severe ulceration of the right fifth toe with concerning erosions on x-rays. Patient very poor historian, does not know how long this lesion has been present.  Denies chest pain, no shortness of breath, no palpitations.  In the ED, temperature 97.7 F, HR 65, RR 18, BP 116/79, SPO2 99% on room air.  Sodium 135, potassium 4.0, chloride 103, CO2 25, BUN 14, creatinine 1.01, glucose 146.  Lactic acid 1.4.  WBC 8.6, hemoglobin 15.1, platelets 202.  COVID-19 PCR negative peer influenza A/B PCR negative.  Right foot MRI with small open wound lateral aspect fifth PIP joint with underlying osteomyelitis involving the proximal/middle phalanges right fifth digit with probable septic arthritis of the PIP joint, cellulitis and mild fasciitis without findings of drainable abscess.  Blood cultures x2 ordered.  Patient was started on empiric antibiotics.  TRH consulted for further evaluation management of diabetic foot wound with underlying osteomyelitis and probable septic arthritis.  Hospital course:  Right fifth toe osteomyelitis Cellulitis Septic arthritis Patient presenting to ED at the request of his outpatient podiatrist for new severe ulceration the right fifth toe with concerning erosions on outpatient x-rays.  Patient is afebrile without leukocytosis.  MRI foot with small open wound lateral aspect fifth PIP joint with underlying osteomyelitis involving the proximal/middle phalanges right fifth digit with probable septic  arthritis of the PIP joint, cellulitis and mild fasciitis without findings of drainable abscess.  Patient was seen by podiatry inpatient, Dr. Posey Pronto with recommendations of right fifth ray amputation.  Patient decided to leave [place] to get a second opinion at [facility]  It was discussed with him by podiatry that he could lose his foot or leg if the infection spreads and he stated understanding and would like to leave despite that.  Reasonable efforts were made to advise the patient of the benefit of staying for evaluation as well as treatment. The patient had the decision-making capacity to refuse treatment at this time. We discussed the associated risks of leaving the hospital prior to completion of workup and treatment; and the patient voiced understanding. We discussed alternatives to current care plan, and the patient still voiced their decision to refuse treatment. Questions were answered and instructions for need of close follow-up was discussed. The AGAINST MEDICAL ADVICE paperwork was signed prior to departure from the hospital.   Discharge Diagnoses:  Principal Problem:   Diabetic ulcer of right foot associated with diabetes mellitus due to underlying condition ([place]) Active Problems:   HTN (hypertension)   OSA (obstructive sleep apnea)   Primary open-angle glaucoma   Type 2 diabetes mellitus without complication, without long-term current use of insulin [facility])    Discharge Instructions     No Known Allergies  Consultations: Podiatry, Dr. Posey Pronto   Procedures/Studies: MR FOOT RIGHT W WO CONTRAST  Result Date: 12/24/2020 CLINICAL DATA:  Diabetic foot ulcer. EXAM: MRI OF THE RIGHT FOREFOOT WITHOUT AND WITH CONTRAST TECHNIQUE: Multiplanar, multisequence MR imaging of the right foot was performed before and after the administration of intravenous contrast. CONTRAST:  49mL GADAVIST GADOBUTROL 1 MMOL/ML IV SOLN COMPARISON:  Radiographs, same date. FINDINGS:  Diffuse subcutaneous soft tissue swelling/edema/fluid mainly along the dorsum of the foot consistent with cellulitis. No discrete fluid collection to suggest a drainable abscess. There is also moderate diffuse myofasciitis without findings suspicious for pyomyositis. Small open wound is noted along the lateral aspect of the fifth PIP joint. There is associated abnormal marrow signal in the proximal and middle phalanges of the fifth digit and subsequent contrast enhancement consistent with osteomyelitis. There is likely septic arthritis at the PIP joint. The other bony structures are grossly intact. There are moderate degenerative changes at the first MTP joint with joint space narrowing, osteophytic spurring and subchondral cystic change. IMPRESSION: 1. Small open wound along the lateral aspect of the fifth PIP joint with underlying osteomyelitis involving the proximal and middle phalanges of the fifth digit. Probable septic arthritis at the PIP joint. 2. Cellulitis and myofasciitis without findings for drainable abscess. 3. Moderate degenerative changes at the first MTP joint. Electronically Signed   By: Marijo Sanes M.D.   On: 12/24/2020 05:03   DG Foot Complete Right  Result Date: 12/23/2020 Please see detailed radiograph report in office note.    Subjective: Patient left AMA  Discharge Exam: Vitals:   12/24/20 0545 12/24/20 0734  BP: 114/73 125/80  Pulse: 72 63  Resp: 20 16  Temp: 97.7 F (36.5 C)   SpO2: 95% 99%   Vitals:   12/23/20 2010 12/24/20 0227 12/24/20 0545 12/24/20 0734  BP:  (!) 132/96 114/73 125/80  Pulse:  (!) 56 72 63  Resp:  20 20 16   Temp:   97.7 F (36.5 C)   TempSrc:   Oral   SpO2:  98% 95% 99%  Weight: 102.1 kg     Height: 6\' 2"  (1.88 m)       No exam performed as patient left AMA prior to evaluation    The results of significant diagnostics from this hospitalization (including imaging, microbiology, ancillary and laboratory) are listed below for  reference.     Microbiology: Recent Results (from the past 240 hour(s))  Culture, blood (routine x 2)     Status: None (Preliminary result)   Collection Time: 12/23/20  3:37 PM   Specimen: BLOOD  Result Value Ref Range Status   Specimen Description BLOOD RIGHT ANTECUBITAL  Final   Special Requests   Final    BOTTLES DRAWN AEROBIC AND ANAEROBIC Blood Culture adequate volume   Culture   Final    NO GROWTH < 24 HOURS Performed at [facility], [address] [place], [place] [address]    Report Status PENDING  Incomplete  Culture, blood (routine x 2)     Status: None (Preliminary result)   Collection Time: 12/23/20  8:49 PM   Specimen: BLOOD  Result Value Ref Range Status   Specimen Description BLOOD RIGHT FOREARM  Final   Special Requests   Final    BOTTLES DRAWN AEROBIC AND ANAEROBIC Blood Culture adequate volume   Culture   Final    NO GROWTH < 12 HOURS Performed at [facility], [address] [place], [place] [address]    Report Status PENDING  Incomplete  Resp Panel by RT-PCR (Flu A&B, Covid) Nasopharyngeal Swab     Status: None   Collection Time: 12/23/20  8:49 PM   Specimen: Nasopharyngeal Swab; Nasopharyngeal(NP) swabs in vial transport medium  Result Value Ref Range Status   SARS Coronavirus 2 by RT  PCR NEGATIVE NEGATIVE Final    Comment: (NOTE) SARS-CoV-2 target nucleic acids are NOT DETECTED.  The SARS-CoV-2 RNA is generally detectable in upper respiratory specimens during the acute phase of infection. The lowest concentration of SARS-CoV-2 viral copies this assay can detect is 138 copies/mL. A negative result does not preclude SARS-Cov-2 infection and should not be used as the sole basis for treatment or other patient management decisions. A negative result may occur with  improper specimen collection/handling, submission of specimen other than nasopharyngeal swab, presence of viral mutation(s) within the areas targeted by this assay, and  inadequate number of viral copies(<138 copies/mL). A negative result must be combined with clinical observations, patient history, and epidemiological information. The expected result is Negative.  Fact Sheet for Patients:  EntrepreneurPulse.com.au  Fact Sheet for Healthcare Providers:  IncredibleEmployment.be  This test is no t yet approved or cleared by the [place] FDA and  has been authorized for detection and/or diagnosis of SARS-CoV-2 by FDA under an Emergency Use Authorization (EUA). This EUA will remain  in effect (meaning this test can be used) for the duration of the COVID-19 declaration under Section 564(b)(1) of the Act, 21 U.S.C.section 360bbb-3(b)(1), unless the authorization is terminated  or revoked sooner.       Influenza A by PCR NEGATIVE NEGATIVE Final   Influenza B by PCR NEGATIVE NEGATIVE Final    Comment: (NOTE) The Xpert Xpress SARS-CoV-2/FLU/RSV plus assay is intended as an aid in the diagnosis of influenza from Nasopharyngeal swab specimens and should not be used as a sole basis for treatment. Nasal washings and aspirates are unacceptable for Xpert Xpress SARS-CoV-2/FLU/RSV testing.  Fact Sheet for Patients: EntrepreneurPulse.com.au  Fact Sheet for Healthcare Providers: IncredibleEmployment.be  This test is not yet approved or cleared by the [place] FDA and has been authorized for detection and/or diagnosis of SARS-CoV-2 by FDA under an Emergency Use Authorization (EUA). This EUA will remain in effect (meaning this test can be used) for the duration of the COVID-19 declaration under Section 564(b)(1) of the Act, 21 U.S.C. section 360bbb-3(b)(1), unless the authorization is terminated or revoked.  Performed at [facility], [address] [place], [place] [address]      Labs: BNP (last 3 results) No results for input(s): BNP in the last 8760  hours. Basic Metabolic Panel: Recent Labs  Lab 12/23/20 1537  NA 135  K 4.0  CL 103  CO2 25  GLUCOSE 146*  BUN 14  CREATININE 1.01  CALCIUM 9.5   Liver Function Tests: No results for input(s): AST, ALT, ALKPHOS, BILITOT, PROT, ALBUMIN in the last 168 hours. No results for input(s): LIPASE, AMYLASE in the last 168 hours. No results for input(s): AMMONIA in the last 168 hours. CBC: Recent Labs  Lab 12/23/20 1537  WBC 8.6  NEUTROABS 6.0  HGB 15.1  HCT 42.8  MCV 93.0  PLT 202   Cardiac Enzymes: No results for input(s): CKTOTAL, CKMB, CKMBINDEX, TROPONINI in the last 168 hours. BNP: Invalid input(s): POCBNP CBG: Recent Labs  Lab 12/24/20 0732  GLUCAP 123*   D-Dimer No results for input(s): DDIMER in the last 72 hours. Hgb A1c No results for input(s): HGBA1C in the last 72 hours. Lipid Profile No results for input(s): CHOL, HDL, LDLCALC, TRIG, CHOLHDL, LDLDIRECT in the last 72 hours. Thyroid function studies No results for input(s): TSH, T4TOTAL, T3FREE, THYROIDAB in the last 72 hours.  Invalid input(s): FREET3 Anemia work up No results for input(s): VITAMINB12, FOLATE, FERRITIN, TIBC,  IRON, RETICCTPCT in the last 72 hours. Urinalysis No results found for: COLORURINE, APPEARANCEUR, [place], [place], [place], [place], [place], [place], [place], UROBILINOGEN, NITRITE, LEUKOCYTESUR Sepsis Labs Invalid input(s): PROCALCITONIN,  WBC,  LACTICIDVEN Microbiology Recent Results (from the past 240 hour(s))  Culture, blood (routine x 2)     Status: None (Preliminary result)   Collection Time: 12/23/20  3:37 PM   Specimen: BLOOD  Result Value Ref Range Status   Specimen Description BLOOD RIGHT ANTECUBITAL  Final   Special Requests   Final    BOTTLES DRAWN AEROBIC AND ANAEROBIC Blood Culture adequate volume   Culture   Final    NO GROWTH < 24 HOURS Performed at [facility], [address] [place], [place] [address]    Report Status PENDING   Incomplete  Culture, blood (routine x 2)     Status: None (Preliminary result)   Collection Time: 12/23/20  8:49 PM   Specimen: BLOOD  Result Value Ref Range Status   Specimen Description BLOOD RIGHT FOREARM  Final   Special Requests   Final    BOTTLES DRAWN AEROBIC AND ANAEROBIC Blood Culture adequate volume   Culture   Final    NO GROWTH < 12 HOURS Performed at [facility], [address], [place], [place] [address]    Report Status PENDING  Incomplete  Resp Panel by RT-PCR (Flu A&B, Covid) Nasopharyngeal Swab     Status: None   Collection Time: 12/23/20  8:49 PM   Specimen: Nasopharyngeal Swab; Nasopharyngeal(NP) swabs in vial transport medium  Result Value Ref Range Status   SARS Coronavirus 2 by RT PCR NEGATIVE NEGATIVE Final    Comment: (NOTE) SARS-CoV-2 target nucleic acids are NOT DETECTED.  The SARS-CoV-2 RNA is generally detectable in upper respiratory specimens during the acute phase of infection. The lowest concentration of SARS-CoV-2 viral copies this assay can detect is 138 copies/mL. A negative result does not preclude SARS-Cov-2 infection and should not be used as the sole basis for treatment or other patient management decisions. A negative result may occur with  improper specimen collection/handling, submission of specimen other than nasopharyngeal swab, presence of viral mutation(s) within the areas targeted by this assay, and inadequate number of viral copies(<138 copies/mL). A negative result must be combined with clinical observations, patient history, and epidemiological information. The expected result is Negative.  Fact Sheet for Patients:  EntrepreneurPulse.com.au  Fact Sheet for Healthcare Providers:  IncredibleEmployment.be  This test is no t yet approved or cleared by the [place] FDA and  has been authorized for detection and/or diagnosis of SARS-CoV-2 by FDA under an Emergency Use Authorization  (EUA). This EUA will remain  in effect (meaning this test can be used) for the duration of the COVID-19 declaration under Section 564(b)(1) of the Act, 21 U.S.C.section 360bbb-3(b)(1), unless the authorization is terminated  or revoked sooner.       Influenza A by PCR NEGATIVE NEGATIVE Final   Influenza B by PCR NEGATIVE NEGATIVE Final    Comment: (NOTE) The Xpert Xpress SARS-CoV-2/FLU/RSV plus assay is intended as an aid in the diagnosis of influenza from Nasopharyngeal swab specimens and should not be used as a sole basis for treatment. Nasal washings and aspirates are unacceptable for Xpert Xpress SARS-CoV-2/FLU/RSV testing.  Fact Sheet for Patients: EntrepreneurPulse.com.au  Fact Sheet for Healthcare Providers: IncredibleEmployment.be  This test is not yet approved or cleared by the [place] FDA and has been authorized for detection and/or diagnosis of SARS-CoV-2 by FDA under  an Emergency Use Authorization (EUA). This EUA will remain in effect (meaning this test can be used) for the duration of the COVID-19 declaration under Section 564(b)(1) of the Act, 21 U.S.C. section 360bbb-3(b)(1), unless the authorization is terminated or revoked.  Performed at [facility], [address], [place], [place] [address]      Time coordinating discharge: Over 30 minutes  SIGNED:   Shanara Schnieders J [place] DO  [facility] 12/24/2020, 7:56 AM

## 2020-12-28 ENCOUNTER — Ambulatory Visit: Payer: Medicaid Other | Admitting: Podiatry

## 2020-12-28 ENCOUNTER — Other Ambulatory Visit: Payer: Self-pay

## 2020-12-28 ENCOUNTER — Encounter: Payer: Self-pay | Admitting: Podiatry

## 2020-12-28 VITALS — BP 114/69 | HR 65 | Temp 98.6°F | Resp 24

## 2020-12-28 DIAGNOSIS — H401190 Primary open-angle glaucoma, unspecified eye, stage unspecified: Secondary | ICD-10-CM | POA: Diagnosis present

## 2020-12-28 DIAGNOSIS — Z23 Encounter for immunization: Secondary | ICD-10-CM

## 2020-12-28 DIAGNOSIS — Z8249 Family history of ischemic heart disease and other diseases of the circulatory system: Secondary | ICD-10-CM

## 2020-12-28 DIAGNOSIS — M009 Pyogenic arthritis, unspecified: Secondary | ICD-10-CM | POA: Diagnosis present

## 2020-12-28 DIAGNOSIS — E11622 Type 2 diabetes mellitus with other skin ulcer: Secondary | ICD-10-CM | POA: Diagnosis present

## 2020-12-28 DIAGNOSIS — Z833 Family history of diabetes mellitus: Secondary | ICD-10-CM

## 2020-12-28 DIAGNOSIS — L97519 Non-pressure chronic ulcer of other part of right foot with unspecified severity: Secondary | ICD-10-CM | POA: Diagnosis present

## 2020-12-28 DIAGNOSIS — M869 Osteomyelitis, unspecified: Secondary | ICD-10-CM

## 2020-12-28 DIAGNOSIS — Z20822 Contact with and (suspected) exposure to covid-19: Secondary | ICD-10-CM | POA: Diagnosis present

## 2020-12-28 DIAGNOSIS — I10 Essential (primary) hypertension: Secondary | ICD-10-CM | POA: Diagnosis present

## 2020-12-28 DIAGNOSIS — Z7984 Long term (current) use of oral hypoglycemic drugs: Secondary | ICD-10-CM

## 2020-12-28 DIAGNOSIS — M48061 Spinal stenosis, lumbar region without neurogenic claudication: Secondary | ICD-10-CM | POA: Diagnosis present

## 2020-12-28 DIAGNOSIS — N400 Enlarged prostate without lower urinary tract symptoms: Secondary | ICD-10-CM | POA: Diagnosis present

## 2020-12-28 DIAGNOSIS — E1140 Type 2 diabetes mellitus with diabetic neuropathy, unspecified: Secondary | ICD-10-CM | POA: Diagnosis present

## 2020-12-28 DIAGNOSIS — F32A Depression, unspecified: Secondary | ICD-10-CM | POA: Diagnosis present

## 2020-12-28 DIAGNOSIS — H5461 Unqualified visual loss, right eye, normal vision left eye: Secondary | ICD-10-CM | POA: Diagnosis present

## 2020-12-28 DIAGNOSIS — J449 Chronic obstructive pulmonary disease, unspecified: Secondary | ICD-10-CM | POA: Diagnosis present

## 2020-12-28 DIAGNOSIS — E785 Hyperlipidemia, unspecified: Secondary | ICD-10-CM | POA: Diagnosis present

## 2020-12-28 DIAGNOSIS — E1169 Type 2 diabetes mellitus with other specified complication: Principal | ICD-10-CM | POA: Diagnosis present

## 2020-12-28 DIAGNOSIS — Z7951 Long term (current) use of inhaled steroids: Secondary | ICD-10-CM

## 2020-12-28 DIAGNOSIS — Z79899 Other long term (current) drug therapy: Secondary | ICD-10-CM

## 2020-12-28 DIAGNOSIS — G4733 Obstructive sleep apnea (adult) (pediatric): Secondary | ICD-10-CM | POA: Diagnosis present

## 2020-12-28 DIAGNOSIS — F17210 Nicotine dependence, cigarettes, uncomplicated: Secondary | ICD-10-CM | POA: Diagnosis present

## 2020-12-28 LAB — COMPREHENSIVE METABOLIC PANEL
ALT: 16 U/L (ref 0–44)
AST: 17 U/L (ref 15–41)
Albumin: 4.1 g/dL (ref 3.5–5.0)
Alkaline Phosphatase: 82 U/L (ref 38–126)
Anion gap: 4 — ABNORMAL LOW (ref 5–15)
BUN: 16 mg/dL (ref 6–20)
CO2: 27 mmol/L (ref 22–32)
Calcium: 9.4 mg/dL (ref 8.9–10.3)
Chloride: 107 mmol/L (ref 98–111)
Creatinine, Ser: 0.95 mg/dL (ref 0.61–1.24)
GFR, Estimated: >60 mL/min (ref >60)
Glucose, Bld: 143 mg/dL — ABNORMAL HIGH (ref 70–99)
Potassium: 3.6 mmol/L (ref 3.5–5.1)
Sodium: 138 mmol/L (ref 135–145)
Total Bilirubin: 0.5 mg/dL (ref 0.3–1.2)
Total Protein: 6.7 g/dL (ref 6.5–8.1)

## 2020-12-28 LAB — CBC
HCT: 37.3 % — ABNORMAL LOW (ref 39.0–52.0)
Hemoglobin: 13.1 g/dL (ref 13.0–17.0)
MCH: 32.6 pg (ref 26.0–34.0)
MCHC: 35.1 g/dL (ref 30.0–36.0)
MCV: 92.8 fL (ref 80.0–100.0)
Platelets: 192 10*3/uL (ref 150–400)
RBC: 4.02 MIL/uL — ABNORMAL LOW (ref 4.22–5.81)
RDW: 13.2 % (ref 11.5–15.5)
WBC: 7.8 10*3/uL (ref 4.0–10.5)
nRBC: 0 % (ref 0.0–0.2)

## 2020-12-28 LAB — CULTURE, BLOOD (ROUTINE X 2)
Culture: NO GROWTH
Culture: NO GROWTH
Special Requests: ADEQUATE
Special Requests: ADEQUATE

## 2020-12-28 NOTE — H&P (View-Only) (Signed)
  Subjective:  Patient ID: [name], male    DOB: Nov 22, 1965,  MRN: [id number]  Chief Complaint  Patient presents with   Wound Check    "I'm here to see about getting an amputation of my toe."    55 y.o. male returns with the above complaint. History confirmed with patient.  He returns today for follow-up on his right fifth toe.  Since I saw him last visit he did go to the ER and I discussed his case with the ER staff as well as my partner Dr. Posey Pronto, he was admitted and MRI showed osteomyelitis.  He says when there was discussion of amputation he was scared and left the hospital for a second opinion at [facility] and they said the same thing to him.  He returns to me today to discuss further  Objective:  Physical Exam: warm, good capillary refill, no trophic changes or ulcerative lesions, and normal DP and PT pulses.  Full-thickness ulceration with exposed subcutaneous tissue submetatarsal 1 0.6x0.5 x 0.2 cm ulceration with no exposed bone tendon or joint.  Fully granular wound bed.  New full-thickness ulceration of the lateral fifth toe with cellulitis    MRI from hospital showing osteomyelitis of the fifth toe  New multiple view radiographs of the right foot show erosion of the distal portion of the proximal phalanx Not present on prior films  Assessment:   1. Osteomyelitis of fifth toe of right foot ([place])        Plan:  Patient was evaluated and treated and all questions answered.  Again discussed with him the presence of the infection which Dr. Posey Pronto has also already discussed with him while he was in the hospital.  I discussed with him that I do not think antibiotics alone would cure this and amputation is the best way to prevent spread of infection or worsening of his condition.  I discussed with him doing this is soon as possible the only way I can do this as an outpatient for him would be at the surgery center on Friday in [place] which he says he cannot get to and that  likely leaving the infection for 5 more days would lead to worsening of his condition and may end up losing more than the fifth toe if that was the case.  Discussed with him that he should go to the hospital again today for admission and we will plan to do surgery tomorrow with my partner Dr. Amalia Hailey.  I discussed with him that we had a shared call service and that all of my partners are capable and competent of performing his surgery and that if I were him I would proceed with the same and I think this reassured him of a lot of his insecurity regarding surgery.  He understands to go to the hospital tonight for admission  Return if symptoms worsen or fail to improve.

## 2020-12-28 NOTE — Progress Notes (Signed)
  Subjective:  Patient ID: [name], male    DOB: Oct 07, 1965,  MRN: [id number]  Chief Complaint  Patient presents with   Wound Check    "I'm here to see about getting an amputation of my toe."    55 y.o. male returns with the above complaint. History confirmed with patient.  He returns today for follow-up on his right fifth toe.  Since I saw him last visit he did go to the ER and I discussed his case with the ER staff as well as my partner Dr. Posey Pronto, he was admitted and MRI showed osteomyelitis.  He says when there was discussion of amputation he was scared and left the hospital for a second opinion at [facility] and they said the same thing to him.  He returns to me today to discuss further  Objective:  Physical Exam: warm, good capillary refill, no trophic changes or ulcerative lesions, and normal DP and PT pulses.  Full-thickness ulceration with exposed subcutaneous tissue submetatarsal 1 0.6x0.5 x 0.2 cm ulceration with no exposed bone tendon or joint.  Fully granular wound bed.  New full-thickness ulceration of the lateral fifth toe with cellulitis    MRI from hospital showing osteomyelitis of the fifth toe  New multiple view radiographs of the right foot show erosion of the distal portion of the proximal phalanx Not present on prior films  Assessment:   1. Osteomyelitis of fifth toe of right foot ([place])        Plan:  Patient was evaluated and treated and all questions answered.  Again discussed with him the presence of the infection which Dr. Posey Pronto has also already discussed with him while he was in the hospital.  I discussed with him that I do not think antibiotics alone would cure this and amputation is the best way to prevent spread of infection or worsening of his condition.  I discussed with him doing this is soon as possible the only way I can do this as an outpatient for him would be at the surgery center on Friday in [place] which he says he cannot get to and that  likely leaving the infection for 5 more days would lead to worsening of his condition and may end up losing more than the fifth toe if that was the case.  Discussed with him that he should go to the hospital again today for admission and we will plan to do surgery tomorrow with my partner Dr. Amalia Hailey.  I discussed with him that we had a shared call service and that all of my partners are capable and competent of performing his surgery and that if I were him I would proceed with the same and I think this reassured him of a lot of his insecurity regarding surgery.  He understands to go to the hospital tonight for admission  Return if symptoms worsen or fail to improve.

## 2020-12-28 NOTE — ED Triage Notes (Signed)
Pt states that he was admitted Friday and was receiving iv antibiotics and left the hospital due to being fearful of having his right 5th toe removed, pt states that he left to get a second opinion and went to [facility] and they did more tests and told him they are 4 mos behind on amputations

## 2020-12-29 ENCOUNTER — Encounter: Payer: Self-pay | Admitting: Internal Medicine

## 2020-12-29 ENCOUNTER — Inpatient Hospital Stay
Admission: EM | Admit: 2020-12-29 | Discharge: 2020-12-30 | DRG: 617 | Disposition: A | Discharge status: Home / Self Care | Payer: Medicaid Other | Source: Ambulatory Visit | Attending: Internal Medicine | Admitting: Internal Medicine

## 2020-12-29 ENCOUNTER — Inpatient Hospital Stay: Payer: Medicaid Other | Admitting: Registered Nurse

## 2020-12-29 ENCOUNTER — Encounter
Admission: EM | Disposition: A | Discharge status: Home / Self Care | Payer: Self-pay | Source: Home / Self Care | Attending: Internal Medicine

## 2020-12-29 DIAGNOSIS — I10 Essential (primary) hypertension: Secondary | ICD-10-CM | POA: Diagnosis present

## 2020-12-29 DIAGNOSIS — M869 Osteomyelitis, unspecified: Secondary | ICD-10-CM | POA: Diagnosis present

## 2020-12-29 DIAGNOSIS — N400 Enlarged prostate without lower urinary tract symptoms: Secondary | ICD-10-CM | POA: Diagnosis present

## 2020-12-29 DIAGNOSIS — M48061 Spinal stenosis, lumbar region without neurogenic claudication: Secondary | ICD-10-CM | POA: Diagnosis present

## 2020-12-29 DIAGNOSIS — F32A Depression, unspecified: Secondary | ICD-10-CM | POA: Diagnosis present

## 2020-12-29 DIAGNOSIS — Z79899 Other long term (current) drug therapy: Secondary | ICD-10-CM | POA: Diagnosis not present

## 2020-12-29 DIAGNOSIS — Z20822 Contact with and (suspected) exposure to covid-19: Secondary | ICD-10-CM | POA: Diagnosis present

## 2020-12-29 DIAGNOSIS — E1169 Type 2 diabetes mellitus with other specified complication: Secondary | ICD-10-CM | POA: Diagnosis present

## 2020-12-29 DIAGNOSIS — J449 Chronic obstructive pulmonary disease, unspecified: Secondary | ICD-10-CM | POA: Diagnosis present

## 2020-12-29 DIAGNOSIS — M009 Pyogenic arthritis, unspecified: Secondary | ICD-10-CM | POA: Diagnosis present

## 2020-12-29 DIAGNOSIS — E785 Hyperlipidemia, unspecified: Secondary | ICD-10-CM | POA: Diagnosis present

## 2020-12-29 DIAGNOSIS — E08621 Diabetes mellitus due to underlying condition with foot ulcer: Secondary | ICD-10-CM | POA: Diagnosis present

## 2020-12-29 DIAGNOSIS — Z8249 Family history of ischemic heart disease and other diseases of the circulatory system: Secondary | ICD-10-CM | POA: Diagnosis not present

## 2020-12-29 DIAGNOSIS — G4733 Obstructive sleep apnea (adult) (pediatric): Secondary | ICD-10-CM | POA: Diagnosis present

## 2020-12-29 DIAGNOSIS — Z7984 Long term (current) use of oral hypoglycemic drugs: Secondary | ICD-10-CM | POA: Diagnosis not present

## 2020-12-29 DIAGNOSIS — Z23 Encounter for immunization: Secondary | ICD-10-CM | POA: Diagnosis not present

## 2020-12-29 DIAGNOSIS — H401190 Primary open-angle glaucoma, unspecified eye, stage unspecified: Secondary | ICD-10-CM | POA: Diagnosis present

## 2020-12-29 DIAGNOSIS — L97519 Non-pressure chronic ulcer of other part of right foot with unspecified severity: Secondary | ICD-10-CM | POA: Diagnosis present

## 2020-12-29 DIAGNOSIS — E119 Type 2 diabetes mellitus without complications: Secondary | ICD-10-CM

## 2020-12-29 DIAGNOSIS — F17210 Nicotine dependence, cigarettes, uncomplicated: Secondary | ICD-10-CM | POA: Diagnosis present

## 2020-12-29 DIAGNOSIS — E11622 Type 2 diabetes mellitus with other skin ulcer: Secondary | ICD-10-CM | POA: Diagnosis present

## 2020-12-29 DIAGNOSIS — Z7951 Long term (current) use of inhaled steroids: Secondary | ICD-10-CM | POA: Diagnosis not present

## 2020-12-29 DIAGNOSIS — M86671 Other chronic osteomyelitis, right ankle and foot: Secondary | ICD-10-CM | POA: Diagnosis not present

## 2020-12-29 DIAGNOSIS — Z720 Tobacco use: Secondary | ICD-10-CM | POA: Diagnosis present

## 2020-12-29 DIAGNOSIS — Z833 Family history of diabetes mellitus: Secondary | ICD-10-CM | POA: Diagnosis not present

## 2020-12-29 DIAGNOSIS — H5461 Unqualified visual loss, right eye, normal vision left eye: Secondary | ICD-10-CM | POA: Diagnosis present

## 2020-12-29 DIAGNOSIS — E1140 Type 2 diabetes mellitus with diabetic neuropathy, unspecified: Secondary | ICD-10-CM | POA: Diagnosis present

## 2020-12-29 HISTORY — PX: AMPUTATION TOE: SHX6595

## 2020-12-29 LAB — GLUCOSE, CAPILLARY: Glucose-Capillary: 181 mg/dL — ABNORMAL HIGH (ref 70–99)

## 2020-12-29 LAB — CBG MONITORING, ED
Glucose-Capillary: 124 mg/dL — ABNORMAL HIGH (ref 70–99)
Glucose-Capillary: 120 mg/dL — ABNORMAL HIGH (ref 70–99)

## 2020-12-29 LAB — RESP PANEL BY RT-PCR (FLU A&B, COVID) ARPGX2
Influenza A by PCR: NEGATIVE
Influenza B by PCR: NEGATIVE
SARS Coronavirus 2 by RT PCR: NEGATIVE

## 2020-12-29 SURGERY — AMPUTATION, TOE
Anesthesia: General | Site: Toe | Laterality: Right

## 2020-12-29 MED ORDER — INSULIN ASPART 100 UNIT/ML IJ SOLN
0.0000 [IU] | Freq: Three times a day (TID) | INTRAMUSCULAR | Status: DC
  Administered 2020-12-30: 2 [IU] via SUBCUTANEOUS
  Filled 2020-12-29 (×2): qty 1

## 2020-12-29 MED ORDER — TIOTROPIUM BROMIDE MONOHYDRATE 18 MCG IN CAPS
18.0000 ug | ORAL_CAPSULE | Freq: Every day | RESPIRATORY_TRACT | Status: DC
  Administered 2020-12-29: 18 ug via RESPIRATORY_TRACT
  Filled 2020-12-29: qty 5

## 2020-12-29 MED ORDER — FENTANYL CITRATE (PF) 100 MCG/2ML IJ SOLN
INTRAMUSCULAR | Status: AC
  Filled 2020-12-29: qty 2

## 2020-12-29 MED ORDER — SODIUM CHLORIDE 0.9 % IV SOLN: INTRAVENOUS | Status: DC

## 2020-12-29 MED ORDER — SODIUM CHLORIDE FLUSH 0.9 % IV SOLN
INTRAVENOUS | Status: AC
  Filled 2020-12-29: qty 10

## 2020-12-29 MED ORDER — BUPROPION HCL ER (SR) 100 MG PO TB12
100.0000 mg | ORAL_TABLET | Freq: Two times a day (BID) | ORAL | Status: DC
  Administered 2020-12-30: 100 mg via ORAL
  Filled 2020-12-29 (×2): qty 1

## 2020-12-29 MED ORDER — PROPOFOL 10 MG/ML IV BOLUS
INTRAVENOUS | Status: DC | PRN
  Administered 2020-12-29: 200 mg via INTRAVENOUS

## 2020-12-29 MED ORDER — OXYCODONE-ACETAMINOPHEN 5-325 MG PO TABS
1.0000 | ORAL_TABLET | ORAL | Status: DC | PRN
  Administered 2020-12-30: 2 via ORAL
  Filled 2020-12-29: qty 2

## 2020-12-29 MED ORDER — METRONIDAZOLE 500 MG PO TABS
500.0000 mg | ORAL_TABLET | Freq: Three times a day (TID) | ORAL | Status: DC
  Administered 2020-12-29 – 2020-12-30 (×2): 500 mg via ORAL
  Filled 2020-12-29 (×4): qty 1

## 2020-12-29 MED ORDER — ENOXAPARIN SODIUM 40 MG/0.4ML IJ SOSY: 40.0000 mg | PREFILLED_SYRINGE | INTRAMUSCULAR | Status: DC

## 2020-12-29 MED ORDER — MEPERIDINE HCL 25 MG/ML IJ SOLN: 6.2500 mg | INTRAMUSCULAR | Status: DC | PRN

## 2020-12-29 MED ORDER — SIMVASTATIN 20 MG PO TABS
20.0000 mg | ORAL_TABLET | Freq: Every day | ORAL | Status: DC
  Administered 2020-12-29: 20 mg via ORAL
  Filled 2020-12-29 (×2): qty 1

## 2020-12-29 MED ORDER — FENTANYL CITRATE (PF) 100 MCG/2ML IJ SOLN
25.0000 ug | INTRAMUSCULAR | Status: DC | PRN
  Administered 2020-12-29 (×2): 50 ug via INTRAVENOUS

## 2020-12-29 MED ORDER — INFLUENZA VAC SPLIT QUAD 0.5 ML IM SUSY
0.5000 mL | PREFILLED_SYRINGE | INTRAMUSCULAR | Status: AC
  Administered 2020-12-30: 0.5 mL via INTRAMUSCULAR
  Filled 2020-12-29: qty 0.5

## 2020-12-29 MED ORDER — TAMSULOSIN HCL 0.4 MG PO CAPS
0.4000 mg | ORAL_CAPSULE | Freq: Every day | ORAL | Status: DC
  Administered 2020-12-29 – 2020-12-30 (×2): 0.4 mg via ORAL
  Filled 2020-12-29: qty 1

## 2020-12-29 MED ORDER — OXYCODONE HCL 5 MG/5ML PO SOLN: 5.0000 mg | Freq: Once | ORAL | Status: AC | PRN

## 2020-12-29 MED ORDER — ONDANSETRON HCL 4 MG/2ML IJ SOLN
INTRAMUSCULAR | Status: DC | PRN
  Administered 2020-12-29: 4 mg via INTRAVENOUS

## 2020-12-29 MED ORDER — ENOXAPARIN SODIUM 60 MG/0.6ML IJ SOSY
0.5000 mg/kg | PREFILLED_SYRINGE | INTRAMUSCULAR | Status: DC
  Administered 2020-12-30: 57.5 mg via SUBCUTANEOUS
  Filled 2020-12-29: qty 0.6

## 2020-12-29 MED ORDER — GABAPENTIN 100 MG PO CAPS
100.0000 mg | ORAL_CAPSULE | Freq: Two times a day (BID) | ORAL | Status: DC
  Administered 2020-12-29 – 2020-12-30 (×2): 100 mg via ORAL
  Filled 2020-12-29 (×2): qty 1

## 2020-12-29 MED ORDER — VERAPAMIL HCL ER 240 MG PO TBCR
240.0000 mg | EXTENDED_RELEASE_TABLET | Freq: Two times a day (BID) | ORAL | Status: DC
  Administered 2020-12-29 – 2020-12-30 (×2): 240 mg via ORAL
  Filled 2020-12-29 (×3): qty 1

## 2020-12-29 MED ORDER — DEXAMETHASONE SODIUM PHOSPHATE 10 MG/ML IJ SOLN
INTRAMUSCULAR | Status: AC
  Filled 2020-12-29: qty 1

## 2020-12-29 MED ORDER — VANCOMYCIN HCL 1000 MG IV SOLR
INTRAVENOUS | Status: AC
  Filled 2020-12-29: qty 20

## 2020-12-29 MED ORDER — LABETALOL HCL 5 MG/ML IV SOLN
INTRAVENOUS | Status: AC
  Filled 2020-12-29: qty 4

## 2020-12-29 MED ORDER — FENTANYL CITRATE (PF) 100 MCG/2ML IJ SOLN
INTRAMUSCULAR | Status: DC | PRN
  Administered 2020-12-29 (×4): 25 ug via INTRAVENOUS

## 2020-12-29 MED ORDER — OXYCODONE HCL 5 MG PO TABS
ORAL_TABLET | ORAL | Status: AC
  Filled 2020-12-29: qty 1

## 2020-12-29 MED ORDER — SODIUM CHLORIDE 0.9 % IV SOLN
1.0000 g | Freq: Once | INTRAVENOUS | Status: AC
  Administered 2020-12-29: 1 g via INTRAVENOUS
  Filled 2020-12-29: qty 1

## 2020-12-29 MED ORDER — CHLORHEXIDINE GLUCONATE 0.12 % MT SOLN
15.0000 mL | Freq: Once | OROMUCOSAL | Status: AC
  Administered 2020-12-29: 15 mL via OROMUCOSAL

## 2020-12-29 MED ORDER — LIDOCAINE HCL (PF) 2 % IJ SOLN
INTRAMUSCULAR | Status: AC
  Filled 2020-12-29: qty 5

## 2020-12-29 MED ORDER — ONDANSETRON HCL 4 MG PO TABS: 4.0000 mg | ORAL_TABLET | Freq: Four times a day (QID) | ORAL | Status: DC | PRN

## 2020-12-29 MED ORDER — FESOTERODINE FUMARATE ER 4 MG PO TB24
4.0000 mg | ORAL_TABLET | Freq: Every day | ORAL | Status: DC
  Administered 2020-12-29 – 2020-12-30 (×2): 4 mg via ORAL
  Filled 2020-12-29 (×2): qty 1

## 2020-12-29 MED ORDER — 0.9 % SODIUM CHLORIDE (POUR BTL) OPTIME
TOPICAL | Status: DC | PRN
  Administered 2020-12-29: 1000 mL

## 2020-12-29 MED ORDER — BUPIVACAINE HCL (PF) 0.5 % IJ SOLN
INTRAMUSCULAR | Status: AC
  Filled 2020-12-29: qty 30

## 2020-12-29 MED ORDER — BUPIVACAINE HCL 0.5 % IJ SOLN
INTRAMUSCULAR | Status: DC | PRN
  Administered 2020-12-29: 10 mL

## 2020-12-29 MED ORDER — METHAZOLAMIDE 25 MG PO TABS
50.0000 mg | ORAL_TABLET | Freq: Two times a day (BID) | ORAL | Status: DC
  Administered 2020-12-29 – 2020-12-30 (×2): 50 mg via ORAL
  Filled 2020-12-29 (×3): qty 2

## 2020-12-29 MED ORDER — DEXAMETHASONE SODIUM PHOSPHATE 10 MG/ML IJ SOLN
INTRAMUSCULAR | Status: DC | PRN
  Administered 2020-12-29: 4 mg via INTRAVENOUS

## 2020-12-29 MED ORDER — HYDROCODONE-ACETAMINOPHEN 5-325 MG PO TABS
1.0000 | ORAL_TABLET | ORAL | Status: DC | PRN
  Administered 2020-12-29 – 2020-12-30 (×2): 2 via ORAL
  Filled 2020-12-29 (×2): qty 2

## 2020-12-29 MED ORDER — VANCOMYCIN HCL 2000 MG/400ML IV SOLN
2000.0000 mg | Freq: Once | INTRAVENOUS | Status: AC
  Administered 2020-12-29: 2000 mg via INTRAVENOUS
  Filled 2020-12-29: qty 400

## 2020-12-29 MED ORDER — TRAZODONE HCL 100 MG PO TABS
300.0000 mg | ORAL_TABLET | Freq: Every day | ORAL | Status: DC
  Administered 2020-12-29: 300 mg via ORAL
  Filled 2020-12-29: qty 3

## 2020-12-29 MED ORDER — SODIUM CHLORIDE 0.9 % IV SOLN
2.0000 g | INTRAVENOUS | Status: DC
  Administered 2020-12-29: 2 g via INTRAVENOUS
  Filled 2020-12-29 (×2): qty 20

## 2020-12-29 MED ORDER — ACYCLOVIR 200 MG PO CAPS
400.0000 mg | ORAL_CAPSULE | Freq: Two times a day (BID) | ORAL | Status: DC
  Administered 2020-12-29 – 2020-12-30 (×2): 400 mg via ORAL
  Filled 2020-12-29 (×3): qty 2

## 2020-12-29 MED ORDER — ALBUTEROL SULFATE (2.5 MG/3ML) 0.083% IN NEBU
3.0000 mL | INHALATION_SOLUTION | RESPIRATORY_TRACT | Status: DC | PRN
  Filled 2020-12-29: qty 3

## 2020-12-29 MED ORDER — MIDAZOLAM HCL 2 MG/2ML IJ SOLN
INTRAMUSCULAR | Status: AC
  Filled 2020-12-29: qty 2

## 2020-12-29 MED ORDER — METRONIDAZOLE 500 MG/100ML IV SOLN
500.0000 mg | Freq: Once | INTRAVENOUS | Status: AC
  Administered 2020-12-29: 500 mg via INTRAVENOUS
  Filled 2020-12-29: qty 100

## 2020-12-29 MED ORDER — PROPOFOL 10 MG/ML IV BOLUS
INTRAVENOUS | Status: AC
  Filled 2020-12-29: qty 20

## 2020-12-29 MED ORDER — LABETALOL HCL 5 MG/ML IV SOLN
5.0000 mg | Freq: Once | INTRAVENOUS | Status: AC
  Administered 2020-12-29: 5 mg via INTRAVENOUS

## 2020-12-29 MED ORDER — ONDANSETRON HCL 4 MG/2ML IJ SOLN: 4.0000 mg | Freq: Four times a day (QID) | INTRAMUSCULAR | Status: DC | PRN

## 2020-12-29 MED ORDER — OXYCODONE HCL 5 MG PO TABS
5.0000 mg | ORAL_TABLET | Freq: Once | ORAL | Status: AC | PRN
  Administered 2020-12-29: 5 mg via ORAL

## 2020-12-29 MED ORDER — PROMETHAZINE HCL 25 MG/ML IJ SOLN: 6.2500 mg | INTRAMUSCULAR | Status: DC | PRN

## 2020-12-29 MED ORDER — MIDAZOLAM HCL 2 MG/2ML IJ SOLN
INTRAMUSCULAR | Status: DC | PRN
  Administered 2020-12-29: 2 mg via INTRAVENOUS

## 2020-12-29 MED ORDER — ONDANSETRON HCL 4 MG/2ML IJ SOLN
INTRAMUSCULAR | Status: AC
  Filled 2020-12-29: qty 2

## 2020-12-29 MED ORDER — LIDOCAINE HCL (CARDIAC) PF 100 MG/5ML IV SOSY
PREFILLED_SYRINGE | INTRAVENOUS | Status: DC | PRN
  Administered 2020-12-29: 100 mg via INTRAVENOUS

## 2020-12-29 MED ORDER — SUGAMMADEX SODIUM 500 MG/5ML IV SOLN
INTRAVENOUS | Status: AC
  Filled 2020-12-29: qty 5

## 2020-12-29 MED ORDER — CHLORHEXIDINE GLUCONATE 0.12 % MT SOLN
OROMUCOSAL | Status: AC
  Filled 2020-12-29: qty 15

## 2020-12-29 SURGICAL SUPPLY — 47 items
BLADE MED AGGRESSIVE (BLADE) ×1 IMPLANT
BLADE OSC/SAGITTAL MD 5.5X18 (BLADE) IMPLANT
BLADE SURG 15 STRL LF DISP TIS (BLADE) IMPLANT
BLADE SURG 15 STRL SS (BLADE)
BLADE SURG MINI STRL (BLADE) IMPLANT
BNDG CONFORM 2 STRL LF (GAUZE/BANDAGES/DRESSINGS) IMPLANT
BNDG ELASTIC 4X5.8 VLCR STR LF (GAUZE/BANDAGES/DRESSINGS) ×2 IMPLANT
BNDG ESMARK 4X12 TAN STRL LF (GAUZE/BANDAGES/DRESSINGS) ×2 IMPLANT
BNDG GAUZE ELAST 4 BULKY (GAUZE/BANDAGES/DRESSINGS) ×2 IMPLANT
CNTNR SPEC 2.5X3XGRAD LEK (MISCELLANEOUS) ×1
CONT SPEC 4OZ STER OR WHT (MISCELLANEOUS) ×1
CONTAINER SPEC 2.5X3XGRAD LEK (MISCELLANEOUS) ×1 IMPLANT
CUFF TOURN SGL QUICK 12 (TOURNIQUET CUFF) IMPLANT
CUFF TOURN SGL QUICK 18X4 (TOURNIQUET CUFF) IMPLANT
DRAPE FLUOR MINI C-ARM 54X84 (DRAPES) IMPLANT
DURAPREP 26ML APPLICATOR (WOUND CARE) ×2 IMPLANT
ELECT REM PT RETURN 9FT ADLT (ELECTROSURGICAL) ×2
ELECTRODE REM PT RTRN 9FT ADLT (ELECTROSURGICAL) ×1 IMPLANT
GAUZE 4X4 16PLY ~~LOC~~+RFID DBL (SPONGE) ×2 IMPLANT
GAUZE SPONGE 4X4 12PLY STRL (GAUZE/BANDAGES/DRESSINGS) ×4 IMPLANT
GAUZE XEROFORM 1X8 LF (GAUZE/BANDAGES/DRESSINGS) ×2 IMPLANT
GLOVE SURG ENC MOIS LTX SZ7 (GLOVE) ×2 IMPLANT
GLOVE SURG UNDER LTX SZ7 (GLOVE) ×2 IMPLANT
GOWN STRL REUS W/ TWL LRG LVL3 (GOWN DISPOSABLE) ×2 IMPLANT
GOWN STRL REUS W/TWL LRG LVL3 (GOWN DISPOSABLE) ×2
HANDPIECE VERSAJET DEBRIDEMENT (MISCELLANEOUS) IMPLANT
IV NS IRRIG 3000ML ARTHROMATIC (IV SOLUTION) IMPLANT
KIT TURNOVER KIT A (KITS) ×2 IMPLANT
LABEL OR SOLS (LABEL) ×2 IMPLANT
MANIFOLD NEPTUNE II (INSTRUMENTS) ×2 IMPLANT
NDL FILTER BLUNT 18X1 1/2 (NEEDLE) ×1 IMPLANT
NDL HYPO 25X1 1.5 SAFETY (NEEDLE) ×2 IMPLANT
NEEDLE FILTER BLUNT 18X 1/2SAF (NEEDLE) ×1
NEEDLE FILTER BLUNT 18X1 1/2 (NEEDLE) ×1 IMPLANT
NEEDLE HYPO 25X1 1.5 SAFETY (NEEDLE) ×4 IMPLANT
NS IRRIG 500ML POUR BTL (IV SOLUTION) ×2 IMPLANT
PACK EXTREMITY ARMC (MISCELLANEOUS) ×2 IMPLANT
SOL PREP PVP 2OZ (MISCELLANEOUS) ×2
SOLUTION PREP PVP 2OZ (MISCELLANEOUS) ×1 IMPLANT
STOCKINETTE STRL 6IN 960660 (GAUZE/BANDAGES/DRESSINGS) ×2 IMPLANT
SUT ETHILON 3-0 FS-10 30 BLK (SUTURE) ×4
SUT VIC AB 3-0 SH 27 (SUTURE) ×1
SUT VIC AB 3-0 SH 27X BRD (SUTURE) ×1 IMPLANT
SUTURE EHLN 3-0 FS-10 30 BLK (SUTURE) ×2 IMPLANT
SWAB CULTURE AMIES ANAERIB BLU (MISCELLANEOUS) IMPLANT
SYR 10ML LL (SYRINGE) ×2 IMPLANT
WATER STERILE IRR 500ML POUR (IV SOLUTION) ×2 IMPLANT

## 2020-12-29 NOTE — Progress Notes (Signed)
PHARMACY -  BRIEF ANTIBIOTIC NOTE   Pharmacy has received consult(s) for Vancomycin from an ED provider.  The patient's profile has been reviewed for ht/wt/allergies/indication/available labs.    One time order(s) placed for Vancomycin 2 gm per pt wt: 112.9 kg.  Further antibiotics/pharmacy consults should be ordered by admitting physician if indicated.                       Thank you, Renda Rolls, PharmD, [facility] 12/29/2020 5:09 AM

## 2020-12-29 NOTE — Brief Op Note (Signed)
12/29/2020  1:39 PM  PATIENT:  [name]  55 y.o. male  PRE-OPERATIVE DIAGNOSIS:  Osteomyelitis  POST-OPERATIVE DIAGNOSIS:  right 5th toe osteomyelitis  PROCEDURE:  Procedure(s): AMPUTATION TOE-Fifth (Right)  SURGEON:  Surgeon(s) and Role:    [name], DPM - Primary  PHYSICIAN ASSISTANT:   ASSISTANTS: none   ANESTHESIA:   local  EBL: minimal  BLOOD ADMINISTERED:none  DRAINS: none   LOCAL MEDICATIONS USED:  MARCAINE     SPECIMEN:  No Specimen  DISPOSITION OF SPECIMEN:  N/A  COUNTS:  YES  TOURNIQUET:   Total Tourniquet Time Documented: Thigh (Right) - 6 minutes Total: Thigh (Right) - 6 minutes   DICTATION: .Viviann Spare Dictation  PLAN OF CARE: Admit to inpatient   PATIENT DISPOSITION:  PACU - hemodynamically stable.   Delay start of Pharmacological VTE agent (>24hrs) due to surgical blood loss or risk of bleeding: no

## 2020-12-29 NOTE — Anesthesia Procedure Notes (Signed)
Procedure Name: LMA Insertion Date/Time: 12/29/2020 12:53 PM Performed by: Lia Foyer, CRNA Pre-anesthesia Checklist: Patient identified, Emergency Drugs available, Suction available and Patient being monitored Patient Re-evaluated:Patient Re-evaluated prior to induction Oxygen Delivery Method: Circle system utilized Preoxygenation: Pre-oxygenation with 100% oxygen Induction Type: IV induction Ventilation: Mask ventilation without difficulty LMA: LMA flexible inserted LMA Size: 4.5 Tube type: Oral Number of attempts: 1 Airway Equipment and Method: Stylet Placement Confirmation: positive ETCO2 and breath sounds checked- equal and bilateral Tube secured with: Tape Dental Injury: Teeth and Oropharynx as per pre-operative assessment

## 2020-12-29 NOTE — ED Provider Notes (Signed)
[facility] Emergency Department Provider Note  ____________________________________________  Time seen: Approximately 5:02 AM  I have reviewed the triage vital signs and the nursing notes.   HISTORY  Chief Complaint Wound Infection   HPI [name] is a 55 y.o. male with a history of diabetes, hyperlipidemia, smoking, osteomyelitis of the right left toe who presents to the hospital after leaving [place] 5 days ago.  Patient was admitted with osteomyelitis and seen by podiatry who recommended amputation of the toe.  Patient wanted a second opinion and left [place] went to [facility].  At [facility] he was told that he need an amputation but that had to be done as an outpatient and he would have to wait months for that.  Patient then decided to return to [place] to undergo the surgery.  He has not been on antibiotics since leaving the hospital 5 days ago.  He denies fever or chills.  He continues to have moderate constant sharp pain of the right fifth toe.  Past Medical History:  Diagnosis Date   Diabetic foot ulcer ([place])    DM (diabetes mellitus) ([place])    Tobacco abuse     Patient Active Problem List   Diagnosis Date Noted   Diabetic ulcer of right foot associated with diabetes mellitus due to underlying condition ([place]) 11/02/2020   Hyperlipidemia 06/27/2019   Encounter to establish care with new doctor 06/24/2019   Neuropathy 06/24/2019   Hallux malleus 04/18/2019   Recurrent major depressive disorder ([place]) 11/30/2017   Type 2 diabetes mellitus without complication, without long-term current use of insulin ([place]) 11/30/2017   Presence of intraocular lens 09/05/2017   Spinal stenosis of lumbar region 06/14/2017   Primary open-angle glaucoma 11/30/2016   Hypersensitivity pneumonitis ([place]) 06/02/2016   Skin ulcer of left foot with fat layer exposed ([place]) 01/09/2015   History of skin ulcer of lower extremity 01/09/2015   Benign localized  hyperplasia of prostate with urinary obstruction 10/07/2014   Erectile dysfunction 10/07/2014   Nocturia 10/07/2014   Shortness of breath 06/26/2014   Alopecia areata 01/15/2014   Increased frequency of urination 08/13/2013   HTN (hypertension) 07/04/2012   OSA (obstructive sleep apnea) 07/04/2012   PVC's (premature ventricular contractions) 07/04/2012   Eczema of lower extremity 07/04/2012   Ventricular premature depolarization 07/04/2012   Chronic airway obstruction ([place]) 01/12/2011   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  Prior to Admission medications   Medication Sig Start Date End Date Taking? Authorizing Provider  acyclovir (ZOVIRAX) 400 MG tablet Take 400 mg by mouth 2 (two) times daily. 12/12/20   [provider]  albuterol (VENTOLIN HFA) 108 (90 Base) MCG/ACT inhaler Inhale into the lungs. 06/24/19 12/23/20  [provider]  brimonidine (ALPHAGAN) 0.2 % ophthalmic solution Apply 1 drop to eye 2 (two) times daily. 01/02/20 01/01/21  [provider]  buPROPion (WELLBUTRIN SR) 100 MG 12 hr tablet Take 100 mg by mouth 2 (two) times daily. 11/04/19 12/23/20  [provider]  citalopram (CELEXA) 20 MG tablet Take 20 mg by mouth daily. 06/24/19 12/23/20  [provider]  diphenhydramine-acetaminophen (TYLENOL PM) 25-500 MG TABS tablet Take 1 tablet by mouth at bedtime as needed.    [provider]  dorzolamide-timolol (COSOPT) 22.3-6.8 MG/ML ophthalmic solution Place 1 drop into the left eye 2 (two) times daily. 05/15/18   [provider]  fesoterodine (TOVIAZ) 4 MG TB24 tablet Take 4  mg by mouth daily. 04/26/19   [provider]  Fluticasone-Umeclidin-Vilant (TRELEGY ELLIPTA) 100-62.5-25 MCG/INH AEPB Inhale 1 puff into the lungs daily. 02/04/20   [provider]  gabapentin (NEURONTIN) 100 MG capsule Take 100 mg by mouth 2 (two) times daily. 06/24/19   [provider]   metFORMIN (GLUCOPHAGE-XR) 500 MG 24 hr tablet Take 500 mg by mouth daily with breakfast. 06/24/19 12/23/20  [provider]  methazolamide (NEPTAZANE) 50 MG tablet Take 50 mg by mouth 2 (two) times daily. 05/17/13   [provider]  mupirocin ointment (BACTROBAN) 2 % Apply 1 application topically daily. 10/21/20   McDonald, Stephan Minister, DPM  simvastatin (ZOCOR) 20 MG tablet Take 20 mg by mouth daily at 6 PM. 06/24/19   [provider]  tamsulosin (FLOMAX) 0.4 MG CAPS capsule Take 0.4 mg by mouth daily. 06/24/19   [provider]  tiotropium (SPIRIVA HANDIHALER) 18 MCG inhalation capsule Place 18 mcg into inhaler and inhale daily. 06/24/19   [provider]  trazodone (DESYREL) 300 MG tablet Take 300 mg by mouth at bedtime. 12/12/20   [provider]  verapamil (CALAN-SR) 240 MG CR tablet Take 240 mg by mouth 2 (two) times daily. 09/17/20   [provider]    Allergies Patient has no known allergies.  Family History  Problem Relation Age of Onset   Diabetes Mother    Hypertension Father     Social History Social History   Tobacco Use   Smoking status: Every Day    Packs/day: 4.00    Types: Cigarettes   Smokeless tobacco: Never   Tobacco comments:    4/day, currently trying to quit  Substance Use Topics   Alcohol use: Yes    Comment: social drinker   Drug use: Never    Review of Systems  Constitutional: Negative for fever. Eyes: Negative for visual changes. ENT: Negative for sore throat. Neck: No neck pain  Cardiovascular: Negative for chest pain. Respiratory: Negative for shortness of breath. Gastrointestinal: Negative for abdominal pain, vomiting or diarrhea. Genitourinary: Negative for dysuria. Musculoskeletal: Negative for back pain. + infected R 5th toe Skin: Negative for rash. Neurological: Negative for headaches, weakness or numbness. Psych: No SI or HI  ____________________________________________   PHYSICAL  EXAM:  VITAL SIGNS: ED Triage Vitals  Enc Vitals Group     BP 12/28/20 1947 139/76     Pulse Rate 12/28/20 1947 64     Resp 12/28/20 1947 18     Temp 12/28/20 1947 98.2 F (36.8 C)     Temp Source 12/28/20 1947 Oral     SpO2 12/28/20 1947 97 %     Weight 12/28/20 1948 249 lb (112.9 kg)     Height 12/28/20 1948 6\' 2"  (1.88 m)     Head Circumference --      Peak Flow --      Pain Score 12/28/20 1948 0     Pain Loc --      Pain Edu? --      Excl. in [place]? --     Constitutional: Alert and oriented. Well appearing and in no apparent distress. HEENT:      Head: Normocephalic and atraumatic.         Eyes: Conjunctivae are normal. Sclera is non-icteric.       Mouth/Throat: Mucous membranes are moist.       Neck: Supple with no signs of meningismus. Cardiovascular: Regular rate and rhythm. No murmurs, gallops, or rubs.  Respiratory: Normal respiratory effort. Lungs are clear to auscultation bilaterally.  Gastrointestinal: Soft, non tender, and non distended with positive bowel sounds. No rebound or guarding. Genitourinary: No CVA tenderness. Musculoskeletal:  Necrosis of the R 5th toe, tender to palpation, no crepitus. Neurologic: Normal speech and language. Face is symmetric. Moving all extremities. No gross focal neurologic deficits are appreciated. Skin: Skin is warm, dry and intact. No rash noted. Psychiatric: Mood and affect are normal. Speech and behavior are normal.  ____________________________________________   LABS (all labs ordered are listed, but only abnormal results are displayed)  Labs Reviewed  CBC - Abnormal; Notable for the following components:      Result Value   RBC 4.02 (*)    HCT 37.3 (*)    All other components within normal limits  COMPREHENSIVE METABOLIC PANEL - Abnormal; Notable for the following components:   Glucose, Bld 143 (*)    Anion gap 4 (*)    All other components within normal limits  RESP PANEL BY RT-PCR (FLU A&B, COVID) ARPGX2    ____________________________________________  EKG  none  ____________________________________________  RADIOLOGY  none  ____________________________________________   PROCEDURES  Procedure(s) performed: None Procedures   Critical Care performed:  None ____________________________________________   INITIAL IMPRESSION / ASSESSMENT AND PLAN / ED COURSE  55 y.o. male with a history of diabetes, hyperlipidemia, smoking, osteomyelitis of the right left toe who presents to the hospital after leaving AGAINST MEDICAL ADVICE 5 days ago requesting to undergo surgery to amputate his right fifth toe.  Patient has a necrotic wound and an MRI consistent with osteomyelitis.  No signs of sepsis.  Was recommended the patient be admitted for amputation but patient left for second opinion.  Therefore we will restart antibiotics, cefepime, vancomycin, and Flagyl and get patient admitted to the hospitalist service for possible amputation.  Old medical records reviewed including MRI done 6 days ago, discharge instructions, and notes from [facility]      _____________________________________________ Please note:  Patient was evaluated in Emergency Department today for the symptoms described in the history of present illness. Patient was evaluated in the context of the global COVID-19 pandemic, which necessitated consideration that the patient might be at risk for infection with the SARS-CoV-2 virus that causes COVID-19. Institutional protocols and algorithms that pertain to the evaluation of patients at risk for COVID-19 are in a state of rapid change based on information released by regulatory bodies including the CDC and federal and state organizations. These policies and algorithms were followed during the patient's care in the ED.  Some ED evaluations and interventions may be delayed as a result of limited staffing during the pandemic.   [place] Controlled Substance Database was reviewed by  me. ____________________________________________   FINAL CLINICAL IMPRESSION(S) / ED DIAGNOSES   Final diagnoses:  Osteomyelitis of fifth toe of right foot ([place])      NEW MEDICATIONS STARTED DURING THIS VISIT:  ED Discharge Orders     None        Note:  This document was prepared using Dragon voice recognition software and may include unintentional dictation errors.    Rudene Re, MD 12/29/20 [phone]

## 2020-12-29 NOTE — H&P (Signed)
History and Physical    [name] SWF:[id number] DOB: 1965/07/06 DOA: 12/29/2020  PCP: Tester, Micah Flesher, PA-C   Patient coming from: Home  I have personally briefly reviewed patient's old medical records in [place]  Chief Complaint: right toe infection  HPI: [name] is a 55 y.o. male with medical history significant for non-insulin-dependent diabetes mellitus, nicotine dependence, spinal stenosis of lumbar region, BPH, dyslipidemia who presents to the emergency room for definitive treatment for his known right fifth toe osteomyelitis. Patient was seen in the emergency room on 12/23/20 after he was referred by his podiatrist due to concerns of possible osteomyelitis involving the right fifth toe that had a weeping ulcer with increased purulent drainage. Patient had an MRI on 10/12 which showed cellulitis and myositis without findings for drainable abscess.  Small open wound along the lateral aspect of the fifth PIP joint with underlying osteomyelitis involving the proximal and middle phalanges of the fifth digit.  Probable septic arthritis at the PIP joint. He was seen by podiatry who recommended amputation of the right fifth digit. Patient signed out [place] because he wanted to get a second opinion on his treatment options. He returned to the emergency room on 10/17 after going to [facility] and was told he needed an amputation but they had a long wait period, so he decided to have his procedure done here. He continues to complain of a constant sharp pain involving the right fifth toe but denies having any fever, no chills, no chest pain, no shortness of breath, no nausea, no vomiting, no dizziness, no lightheadedness, no headache, no abdominal pain, no changes in his bowel habits, no urinary symptoms, no palpitations, no dizziness, no leg swelling. Labs show sodium 138, potassium 3.6, chloride 107, bicarb 27, glucose 143, BUN 16, creatinine 0.95,  calcium 9.4, alkaline phosphatase 82, albumin 4.1, AST 17, ALT 16, total protein 6.7, white count 7.8, hemoglobin 13.1, hematocrit 37.3, MCV 92.8, RDW 13.2, platelet count 192 Respiratory viral panel is negative    ED Course: Patient is a 55 year old male with a history of diabetes mellitus who presents to the ER for the second time in 1 week for evaluation of an ulcer with purulent drainage involving the right fifth toe.  He had an MRI which showed osteomyelitis and podiatry had recommended amputation.  Patient signed out [place] and had gone to [facility] for second opinion but has returned to [facility] for definitive treatment. He will be admitted to the hospital today for amputation of the right fifth toe.   Review of Systems: As per HPI otherwise all other systems reviewed and negative.    Past Medical History:  Diagnosis Date   Diabetic foot ulcer ([place])    DM (diabetes mellitus) ([place])    Tobacco abuse     History reviewed. No pertinent surgical history.   reports that he has been smoking cigarettes. He has been smoking an average of .5 packs per day. He has never used smokeless tobacco. He reports current alcohol use. He reports that he does not use drugs.  No Known Allergies  Family History  Problem Relation Age of Onset   Diabetes Mother    Hypertension Father       Prior to Admission medications   Medication Sig Start Date End Date Taking? Authorizing Provider  acyclovir (ZOVIRAX) 400 MG tablet Take 400 mg by mouth 2 (two) times daily. 12/12/20   [provider]  albuterol (VENTOLIN HFA)  108 (90 Base) MCG/ACT inhaler Inhale into the lungs. 06/24/19 12/23/20  [provider]  buPROPion (WELLBUTRIN SR) 100 MG 12 hr tablet Take 100 mg by mouth 2 (two) times daily. 11/04/19 12/23/20  [provider]  diphenhydramine-acetaminophen (TYLENOL PM) 25-500 MG TABS tablet Take 1 tablet by mouth at bedtime as needed.    [provider]   fesoterodine (TOVIAZ) 4 MG TB24 tablet Take 4 mg by mouth daily. 04/26/19   [provider]  gabapentin (NEURONTIN) 100 MG capsule Take 100 mg by mouth 2 (two) times daily. 06/24/19   [provider]  metFORMIN (GLUCOPHAGE-XR) 500 MG 24 hr tablet Take 500 mg by mouth daily with breakfast. 06/24/19 12/23/20  [provider]  methazolamide (NEPTAZANE) 50 MG tablet Take 50 mg by mouth 2 (two) times daily. 05/17/13   [provider]  mupirocin ointment (BACTROBAN) 2 % Apply 1 application topically daily. 10/21/20   McDonald, Stephan Minister, DPM  simvastatin (ZOCOR) 20 MG tablet Take 20 mg by mouth daily at 6 PM. 06/24/19   [provider]  tamsulosin (FLOMAX) 0.4 MG CAPS capsule Take 0.4 mg by mouth daily. 06/24/19   [provider]  tiotropium (SPIRIVA HANDIHALER) 18 MCG inhalation capsule Place 18 mcg into inhaler and inhale daily. 06/24/19   [provider]  trazodone (DESYREL) 300 MG tablet Take 300 mg by mouth at bedtime. 12/12/20   [provider]  verapamil (CALAN-SR) 240 MG CR tablet Take 240 mg by mouth 2 (two) times daily. 09/17/20   [provider]    Physical Exam: Vitals:   12/29/20 0601 12/29/20 0700 12/29/20 0900 12/29/20 1009  BP: (!) 141/89 140/86 (!) 138/99 (!) 134/112  Pulse: 60 62 66 62  Resp: 18 16  17   Temp:    (!) 96.5 F (35.8 C)  TempSrc:    Temporal  SpO2: 97% 96% 98% 100%  Weight:    112.9 kg  Height:    6\' 2"  (1.88 m)     Vitals:   12/29/20 0601 12/29/20 0700 12/29/20 0900 12/29/20 1009  BP: (!) 141/89 140/86 (!) 138/99 (!) 134/112  Pulse: 60 62 66 62  Resp: 18 16  17   Temp:    (!) 96.5 F (35.8 C)  TempSrc:    Temporal  SpO2: 97% 96% 98% 100%  Weight:    112.9 kg  Height:    6\' 2"  (1.88 m)      Constitutional: Alert and oriented x 3 . Not in any apparent distress HEENT:      Head: Normocephalic and atraumatic.         Eyes: PERLA, EOMI, Conjunctivae are normal. Sclera is non-icteric.        Mouth/Throat: Mucous membranes are moist.       Neck: Supple with no signs of meningismus. Cardiovascular: Regular rate and rhythm. No murmurs, gallops, or rubs. 2+ symmetrical distal pulses are present . No JVD. No LE edema Respiratory: Respiratory effort normal .Lungs sounds clear bilaterally. No wheezes, crackles, or rhonchi.  Gastrointestinal: Soft, non tender, and non distended with positive bowel sounds.  Genitourinary: No CVA tenderness. Musculoskeletal: Nontender with normal range of motion in all extremities. No cyanosis, or erythema of extremities.  Also over the lateral portion of the right fifth toe with purulent drainage Neurologic:  Face is symmetric. Moving all extremities. No gross focal neurologic deficits . Skin: Skin is warm, dry.  No rash or ulcers Psychiatric: Mood and affect are normal  Labs on Admission: I have personally reviewed following labs and imaging studies  CBC: Recent Labs  Lab 12/23/20 1537 12/28/20 1951  WBC 8.6 7.8  NEUTROABS 6.0  --   HGB 15.1 13.1  HCT 42.8 37.3*  MCV 93.0 92.8  PLT 202 355   Basic Metabolic Panel: Recent Labs  Lab 12/23/20 1537 12/28/20 1951  NA 135 138  K 4.0 3.6  CL 103 107  CO2 25 27  GLUCOSE 146* 143*  BUN 14 16  CREATININE 1.01 0.95  CALCIUM 9.5 9.4   GFR: Estimated Creatinine Clearance: 114.7 mL/min (by C-G formula based on SCr of 0.95 mg/dL). Liver Function Tests: Recent Labs  Lab 12/28/20 1951  AST 17  ALT 16  ALKPHOS 82  BILITOT 0.5  PROT 6.7  ALBUMIN 4.1   No results for input(s): LIPASE, AMYLASE in the last 168 hours. No results for input(s): AMMONIA in the last 168 hours. Coagulation Profile: No results for input(s): INR, PROTIME in the last 168 hours. Cardiac Enzymes: No results for input(s): CKTOTAL, CKMB, CKMBINDEX, TROPONINI in the last 168 hours. BNP (last 3 results) No results for input(s): PROBNP in the last 8760 hours. HbA1C: No results for input(s): HGBA1C in the last 72  hours. CBG: Recent Labs  Lab 12/24/20 0732 12/29/20 1002  GLUCAP 123* 124*   Lipid Profile: No results for input(s): CHOL, HDL, LDLCALC, TRIG, CHOLHDL, LDLDIRECT in the last 72 hours. Thyroid Function Tests: No results for input(s): TSH, T4TOTAL, FREET4, T3FREE, THYROIDAB in the last 72 hours. Anemia Panel: No results for input(s): VITAMINB12, FOLATE, FERRITIN, TIBC, IRON, RETICCTPCT in the last 72 hours. Urine analysis: No results found for: COLORURINE, APPEARANCEUR, LABSPEC, PHURINE, GLUCOSEU, HGBUR, BILIRUBINUR, KETONESUR, PROTEINUR, UROBILINOGEN, NITRITE, LEUKOCYTESUR  Radiological Exams on Admission: No results found.   Assessment/Plan Principal Problem:   Osteomyelitis of fifth toe of right foot (HCC) Active Problems:   COPD (chronic obstructive pulmonary disease) (HCC)   HTN (hypertension)   Spinal stenosis of lumbar region   Type 2 diabetes mellitus without complication, without long-term current use of insulin (HCC)   Diabetic ulcer of right foot associated with diabetes mellitus due to underlying condition ([place])   Tobacco abuse   Depression      Patient is a 55 year old male admitted to the hospital for osteomyelitis of the right fifth toe    Diabetic ulcer right foot/osteomyelitis (POA) Appreciate podiatry input Plan is for amputation of the right fifth toe Place patient empirically on antibiotic therapy with Rocephin and Flagyl Further treatment pending podiatry     Diabetes mellitus Glycemic control with sliding scale insulin Maintain consistent carbohydrate diet We will consult diabetic educator    Nicotine dependence About 5 minutes was spent counseling patient on the need to quit smoking Offered patient a nicotine transdermal patch which he declined due to side effects Patient states that he will reach out to his primary care provider for Chantix as an outpatient     Depression Continue bupropion and trazodone    COPD  not acutely  exacerbated Continue as needed bronchodilator therapy as well as Spiriva    BPH Continue Flomax    Hypertension Continue verapamil   DVT prophylaxis: Lovenox  Code Status: full code  Family Communication: Greater than 50% of time was spent discussing patient's condition and plan of care with him at the bedside.  All questions and concerns have been addressed.  He verbalizes understanding and agrees with the plan. Disposition Plan: Back to previous home environment Consults  called: Podiatry Status:At the time of admission, it appears that the appropriate admission status for this patient is inpatient. This is judged to be reasonable and necessary to provide the required intensity of service to ensure the patient's safety given the presenting symptoms, physical exam findings, and initial radiographic and laboratory data in the context of their comorbid conditions. Patient requires inpatient status due to high intensity of service, high risk for further deterioration and high frequency of surveillance required.     Collier Bullock MD [facility]     12/29/2020, 10:34 AM

## 2020-12-29 NOTE — Anesthesia Preprocedure Evaluation (Signed)
Anesthesia Evaluation  Patient identified by MRN, date of birth, ID band Patient awake    Reviewed: Allergy & Precautions, NPO status , Patient's Chart, lab work & pertinent test results  History of Anesthesia Complications Negative for: history of anesthetic complications  Airway Mallampati: II  TM Distance: >3 FB Neck ROM: Full    Dental  (+) Poor Dentition   Pulmonary sleep apnea (noncompliant with CPAP) , COPD, Current Smoker and Patient abstained from smoking.,    breath sounds clear to auscultation- rhonchi (-) wheezing      Cardiovascular hypertension, Pt. on medications (-) CAD, (-) Past MI, (-) Cardiac Stents and (-) CABG  Rhythm:Regular Rate:Normal - Systolic murmurs and - Diastolic murmurs    Neuro/Psych neg Seizures PSYCHIATRIC DISORDERS Depression negative neurological ROS     GI/Hepatic negative GI ROS, Neg liver ROS,   Endo/Other  diabetes, Oral Hypoglycemic Agents  Renal/GU negative Renal ROS     Musculoskeletal negative musculoskeletal ROS (+)   Abdominal (+) + obese,   Peds  Hematology negative hematology ROS (+)   Anesthesia Other Findings Past Medical History: No date: Diabetic foot ulcer (HCC) No date: DM (diabetes mellitus) ([place]) No date: Tobacco abuse   Reproductive/Obstetrics                             Anesthesia Physical Anesthesia Plan  ASA: 3  Anesthesia Plan: General   Post-op Pain Management:    Induction: Intravenous  PONV Risk Score and Plan: 0 and Ondansetron  Airway Management Planned: LMA  Additional Equipment:   Intra-op Plan:   Post-operative Plan:   Informed Consent: I have reviewed the patients History and Physical, chart, labs and discussed the procedure including the risks, benefits and alternatives for the proposed anesthesia with the patient or authorized representative who has indicated his/her understanding and acceptance.      Dental advisory given  Plan Discussed with: CRNA and Anesthesiologist  Anesthesia Plan Comments:         Anesthesia Quick Evaluation

## 2020-12-29 NOTE — Progress Notes (Signed)
PHARMACIST - PHYSICIAN COMMUNICATION  CONCERNING:  Enoxaparin (Lovenox) for DVT Prophylaxis    RECOMMENDATION: Patient was prescribed enoxaprin 40mg  q24 hours for VTE prophylaxis.   Filed Weights   12/28/20 1948 12/29/20 1009  Weight: 112.9 kg (249 lb) 112.9 kg (248 lb 14.4 oz)    Body mass index is 31.96 kg/m.  Estimated Creatinine Clearance: 114.7 mL/min (by C-G formula based on SCr of 0.95 mg/dL).   Based on [place] patient is candidate for enoxaparin 0.5mg /kg TBW SQ every 24 hours based on BMI being >30  DESCRIPTION: Pharmacy has adjusted enoxaparin dose per [facility] policy.  Patient is now receiving enoxaparin 0.5 mg/kg subcutaneously every 24 hours    Dallie Piles, PharmD Clinical Pharmacist  12/29/2020 6:51 PM

## 2020-12-29 NOTE — Transfer of Care (Signed)
Immediate Anesthesia Transfer of Care Note  Patient: Fabyan T Folmar  Procedure(s) Performed: AMPUTATION TOE-Fifth (Right: Toe)  Patient Location: PACU  Anesthesia Type:General  Level of Consciousness: awake, drowsy and patient cooperative  Airway & Oxygen Therapy: Patient Spontanous Breathing and Patient connected to face mask oxygen  Post-op Assessment: Report given to RN, Post -op Vital signs reviewed and stable and Dr. Randa Lynn notified of high diastolic blood pressure, 5 mg labetalol to be given by PACU RN  Post vital signs: Reviewed and stable  Last Vitals:  Vitals Value Taken Time  BP 147/104 12/29/20 1324  Temp 36.3 C 12/29/20 1323  Pulse 67 12/29/20 1327  Resp 11 12/29/20 1327  SpO2 100 % 12/29/20 1327  Vitals shown include unvalidated device data.  Last Pain:  Vitals:   12/29/20 1009  TempSrc: Temporal  PainSc: 6          Complications: No notable events documented.

## 2020-12-29 NOTE — Interval H&P Note (Signed)
History and Physical Interval Note:  12/29/2020 11:14 AM  [name]  has presented today for surgery, with the diagnosis of Osteomyelitis.  The various methods of treatment have been discussed with the patient and family. After consideration of risks, benefits and other options for treatment, the patient has consented to  Procedure(s): AMPUTATION TOE-Fifth (Right) as a surgical intervention.  The patient's history has been reviewed, patient examined, no change in status, stable for surgery.  I have reviewed the patient's chart and labs.  Questions were answered to the patient's satisfaction.     [name]

## 2020-12-29 NOTE — Op Note (Signed)
   OPERATIVE REPORT Patient name: [name] MRN: [id number] DOB: 09-Mar-1966  DOS: 12/29/2020  Preop Dx: Osteomyelitis right fifth toe Postop Dx: same  Procedure:  1.  Amputation right fifth toe  Surgeon: [name] DPM  Anesthesia: General anesthesia  Hemostasis: Ankle tourniquet inflated to a pressure of 265mmHg after esmarch exsanguination   EBL: Minimal mL Materials: None Injectables: 10 mL of 0.5% Marcaine plain Pathology: None  Condition: The patient tolerated the procedure and anesthesia well. No complications noted or reported   Justification for procedure: The patient is a 55 y.o. male PMHx diabetes mellitus who presents today for surgical correction of osteomyelitis to the right fifth toe. All conservative modalities of been unsuccessful in providing any sort of satisfactory alleviation of symptoms with the patient. The patient was told benefits as well as possible side effects of the surgery. The patient consented for surgical correction. The patient consent form was reviewed. All patient questions were answered. No guarantees were expressed or implied. The patient and the surgeon both signed the patient consent form with the witness present and placed in the patient's chart.   Procedure in Detail: The patient was brought to the operating room, placed in the operating table in the supine position at which time an aseptic scrub and drape were performed about the patient's respective lower extremity after anesthesia was induced as described above. Attention was then directed to the surgical area where procedure number one commenced.  Procedure #1: Amputation right fifth toe A racquet type incision was planned and made about the fifth MTP joint of the right foot.  The incision was carried down to the level of joint capsule with care taken to cut clamp ligate and retract away all small neurovascular structures traversing the incision site.  Surgical #15 scalpel was  utilized to disarticulate the right fifth toe at the level of the MTP joint.  The toe was removed in toto and placed in a sterile specimen container.  Any nonviable tissue within the amputation site was debrided away to allow for good primary closure.  Copious irrigation was utilized and primary closure was obtained using 3-0 nylon suture.  Dry sterile compressive dressings were then applied to all previously mentioned incision sites about the patient's lower extremity. The tourniquet which was used for hemostasis was deflated. All normal neurovascular responses including pink color and warmth returned all the digits of patient's lower extremity.  The patient was then transferred from the operating room to the recovery room having tolerated the procedure and anesthesia well. All vital signs are stable. After a brief stay in the recovery room the patient was readmitted to inpatient room with adequate prescriptions for analgesia. Verbal as well as written instructions were provided for the patient regarding wound care. The patient is to keep the dressings clean dry and intact until they are to follow surgeon Dr. Daylene Katayama in the office upon discharge.   [name], DPM [facility]  Dr. [name], DPM    [address], [place] [address]                Office [phone]  Fax [phone]

## 2020-12-29 NOTE — Anesthesia Postprocedure Evaluation (Signed)
Anesthesia Post Note  Patient: [name]  Procedure(s) Performed: AMPUTATION TOE-Fifth (Right: Toe)  Patient location during evaluation: PACU Anesthesia Type: General Level of consciousness: awake and alert and oriented Pain management: pain level controlled Vital Signs Assessment: post-procedure vital signs reviewed and stable Respiratory status: spontaneous breathing, nonlabored ventilation and respiratory function stable Cardiovascular status: blood pressure returned to baseline and stable Postop Assessment: no signs of nausea or vomiting Anesthetic complications: no   No notable events documented.   Last Vitals:  Vitals:   12/29/20 1430 12/29/20 1446  BP: (!) 129/96 (!) 145/92  Pulse: 63 62  Resp: 14 17  Temp:    SpO2: 99% 99%    Last Pain:  Vitals:   12/29/20 1430  TempSrc:   PainSc: Asleep                 Derrico Zhong

## 2020-12-30 ENCOUNTER — Encounter: Payer: Self-pay | Admitting: Podiatry

## 2020-12-30 LAB — BASIC METABOLIC PANEL
Anion gap: 9 (ref 5–15)
BUN: 14 mg/dL (ref 6–20)
CO2: 22 mmol/L (ref 22–32)
Calcium: 8.8 mg/dL — ABNORMAL LOW (ref 8.9–10.3)
Chloride: 106 mmol/L (ref 98–111)
Creatinine, Ser: 0.83 mg/dL (ref 0.61–1.24)
GFR, Estimated: >60 mL/min (ref >60)
Glucose, Bld: 160 mg/dL — ABNORMAL HIGH (ref 70–99)
Potassium: 3.9 mmol/L (ref 3.5–5.1)
Sodium: 137 mmol/L (ref 135–145)

## 2020-12-30 LAB — CBC
HCT: 38.6 % — ABNORMAL LOW (ref 39.0–52.0)
Hemoglobin: 13.5 g/dL (ref 13.0–17.0)
MCH: 32.1 pg (ref 26.0–34.0)
MCHC: 35 g/dL (ref 30.0–36.0)
MCV: 91.9 fL (ref 80.0–100.0)
Platelets: 201 10*3/uL (ref 150–400)
RBC: 4.2 MIL/uL — ABNORMAL LOW (ref 4.22–5.81)
RDW: 12.6 % (ref 11.5–15.5)
WBC: 8.4 10*3/uL (ref 4.0–10.5)
nRBC: 0 % (ref 0.0–0.2)

## 2020-12-30 LAB — SEDIMENTATION RATE: Sed Rate: 10 mm/hr (ref 0–20)

## 2020-12-30 LAB — GLUCOSE, CAPILLARY
Glucose-Capillary: 130 mg/dL — ABNORMAL HIGH (ref 70–99)
Glucose-Capillary: 154 mg/dL — ABNORMAL HIGH (ref 70–99)

## 2020-12-30 LAB — PREALBUMIN: Prealbumin: 23 mg/dL (ref 18–38)

## 2020-12-30 LAB — C-REACTIVE PROTEIN: CRP: <0.5 mg/dL (ref <1.0)

## 2020-12-30 MED ORDER — AMOXICILLIN-POT CLAVULANATE 875-125 MG PO TABS: 1.0000 | ORAL_TABLET | Freq: Two times a day (BID) | ORAL | 0 refills | Status: AC

## 2020-12-30 MED ORDER — HYDROCODONE-ACETAMINOPHEN 5-325 MG PO TABS: 1.0000 | ORAL_TABLET | ORAL | 0 refills | Status: DC | PRN

## 2020-12-30 NOTE — Progress Notes (Signed)
Physical Therapy Treatment Patient Details Name: [name] MRN: [id number] DOB: [birth year] Today's Date: 12/30/2020   History of Present Illness Pt is a 55 year old male with past medical history significant for type 2 diabetes mellitus, hyperlipidemia, HTN, OSA, tobacco use disorder. S/p R 5th toe amputation. Per secure messaging, pt to be WBAT in post op shoe.    PT Comments    Patient A&Ox4, pleasant, reported 7/10 R foot pain. Pt did demonstrate impulsivity during session with mobility. Pt stated at baseline he is independent, lives alone, but has neighbors that can check in.  He performed bed mobility independently. Several sit <> Stands with varying AD, supervision. Pt ambulated ~52ft total, with RW, IV pole, and handheld assist to simulate a cane. Pt agreeable to [facility] use at home, no lob noted. The patient demonstrated and reported near return to baseline level of functioning, no further acute PT needs indicated. PT to sign off. Please reconsult PT if pt status changes or acute needs are identified.     Recommendations for follow up therapy are one component of a multi-disciplinary discharge planning process, led by the attending physician.  Recommendations may be updated based on patient status, additional functional criteria and insurance authorization.  Follow Up Recommendations  No PT follow up     Equipment Recommendations  Cane    Recommendations for Other Services       Precautions / Restrictions Precautions Precautions: Fall Restrictions Weight Bearing Restrictions: Yes Other Position/Activity Restrictions: post op shoe     Mobility  Bed Mobility Overal bed mobility: Independent                  Transfers Overall transfer level: Modified independent Equipment used: Rolling walker (2 wheeled);1 person hand held assist;None                Ambulation/Gait Ambulation/Gait assistance: Supervision Gait Distance (Feet): 60 Feet Assistive  device: Rolling walker (2 wheeled);IV Pole;1 person hand held assist       General Gait Details: pt impulsive, quick with movements. RW, IV pole, and handheld assist to simulate a cane trialed. Pt agreeable to [facility] use at home. no lob noted.   Stairs             Wheelchair Mobility    Modified Rankin (Stroke Patients Only)       Balance Overall balance assessment: No apparent balance deficits (not formally assessed)                                          Cognition Arousal/Alertness: Awake/alert Behavior During Therapy: WFL for tasks assessed/performed Overall Cognitive Status: Within Functional Limits for tasks assessed                                        Exercises      General Comments        Pertinent Vitals/Pain Pain Assessment: 0-10 Pain Score: 7  Pain Location: R foot Pain Descriptors / Indicators: Sore Pain Intervention(s): Limited activity within patient's tolerance;Repositioned;Monitored during session    Home Living Family/patient expects to be discharged to:: Private residence Living Arrangements: Alone Available Help at Discharge: Friend(s);Neighbor Type of Home: House Home Access: Other (comment) (threshold step)   Home Layout: One level Home Equipment: Walker - standard  Prior Function Level of Independence: Independent      Comments: retired   PT Goals (current goals can now be found in the care plan section)      Frequency           PT Plan      Co-evaluation              AM-PAC PT "6 Clicks" Mobility   Outcome Measure  Help needed turning from your back to your side while in a flat bed without using bedrails?: None Help needed moving from lying on your back to sitting on the side of a flat bed without using bedrails?: None Help needed moving to and from a bed to a chair (including a wheelchair)?: None Help needed standing up from a chair using your arms (e.g., wheelchair  or bedside chair)?: None Help needed to walk in hospital room?: None Help needed climbing 3-5 steps with a railing? : None 6 Click Score: 24    End of Session Equipment Utilized During Treatment: Gait belt Activity Tolerance: Patient tolerated treatment well Patient left: in bed;with call bell/phone within reach;with nursing/sitter in room;with bed alarm set Nurse Communication: Mobility status PT Visit Diagnosis: Other abnormalities of gait and mobility (R26.89)     Time: 4008-6761 PT Time Calculation (min) (ACUTE ONLY): 20 min  Charges:  $Gait Training: 8-22 mins                     Lieutenant Diego PT, DPT 10:43 AM,12/30/20

## 2020-12-30 NOTE — Discharge Summary (Signed)
Physician Discharge Summary  [name] JIR:[id number] DOB: 1965/11/19 DOA: 12/29/2020  PCP: Doroteo Glassman, Hillary A, PA-C  Admit date: 12/29/2020 Discharge date: 12/30/2020  Admitted From: Home Disposition: Home  Recommendations for Outpatient Follow-up:  Follow up with PCP in 1-2 weeks Follow-up podiatry 1 week  Home Health: No Equipment/Devices: Surgical offloading shoe, single-point cane  Discharge Condition: Stable CODE STATUS: Full Diet recommendation: Carb modified  Brief/Interim Summary:   55 y.o. male with medical history significant for non-insulin-dependent diabetes mellitus, nicotine dependence, spinal stenosis of lumbar region, BPH, dyslipidemia who presents to the emergency room for definitive treatment for his known right  The. Patient was seen in the emergency room on 12/23/20 after he was referred by his podiatrist due to concerns of possible osteomyelitis involving the right fifth toe that had a weeping ulcer with increased purulent drainage. Patient had an MRI on 10/12 which showed cellulitis and myositis without findings for drainable abscess.  Small open wound along the lateral aspect of the fifth PIP joint with underlying osteomyelitis involving the proximal and middle phalanges of the fifth digit.  Probable septic arthritis at the PIP joint. He was seen by podiatry who recommended amputation of the right fifth digit.  Patient underwent amputation of right fifth toe on 10/18 with Dr. Amalia Hailey from podiatry.  No postoperative complications.  Seen on postoperative day 1.  Discussed care with podiatry consulted Dr. Sherryle Lis.  Cleared for discharge from their standpoint.  Recommend 2 weeks of Augmentin 875/125 on discharge.  This is been prescribed.  Therapy evaluations requested.  Patient ambulating without difficulty.  Pain well controlled.  Stable for discharge at this time.  We will follow-up with podiatry postdischarge.   Discharge Diagnoses:  Principal Problem:    Osteomyelitis of fifth toe of right foot ([place]) Active Problems:   COPD (chronic obstructive pulmonary disease) (HCC)   HTN (hypertension)   Spinal stenosis of lumbar region   Type 2 diabetes mellitus without complication, without long-term current use of insulin (H[facility])   Diabetic ulcer of right foot associated with diabetes mellitus due to underlying condition ([name])   Tobacco abuse   Depression  Right fifth toe osteomyelitis  Patient was seen in the emergency room and podiatry was consulted.  Recommended right fifth toe amputation.  Patient successfully underwent this procedure on 10/18.  No postoperative complications.  Pain well controlled on postoperative day 1.  Stable for discharge home.  Discussed with podiatry Dr. Sherryle Lis.  Recommend 2 weeks of Augmentin 875/125.  This is prescribed on discharge.  Patient was also evaluated with physical therapy.  Ambulating freely without too much pain.  Recommend surgical offloading shoe and single-point cane.  This is been ordered on discharge.  Stable for discharge home at this time.  Follow-up documented.  Discharge Instructions  Discharge Instructions     Diet - low sodium heart healthy   Complete by: As directed    Increase activity slowly   Complete by: As directed    No wound care   Complete by: As directed       Allergies as of 12/30/2020   No Known Allergies      Medication List     TAKE these medications    acyclovir 400 MG tablet Commonly known as: ZOVIRAX Take 400 mg by mouth 2 (two) times daily.   albuterol 108 (90 Base) MCG/ACT inhaler Commonly known as: VENTOLIN HFA Inhale into the lungs.   amoxicillin-clavulanate 875-125 MG tablet Commonly known as: Augmentin Take 1 tablet by  mouth 2 (two) times daily for 14 days.   brimonidine 0.15 % ophthalmic solution Commonly known as: ALPHAGAN 1 drop in the morning and at bedtime.   buPROPion ER 100 MG 12 hr tablet Commonly known as: WELLBUTRIN SR Take 100 mg by  mouth 2 (two) times daily.   diphenhydramine-acetaminophen 25-500 MG Tabs tablet Commonly known as: TYLENOL PM Take 1 tablet by mouth at bedtime as needed.   dorzolamide-timolol 22.3-6.8 MG/ML ophthalmic solution Commonly known as: COSOPT 1 drop 2 (two) times daily.   fesoterodine 4 MG Tb24 tablet Commonly known as: TOVIAZ Take 4 mg by mouth daily.   gabapentin 100 MG capsule Commonly known as: NEURONTIN Take 100 mg by mouth 2 (two) times daily.   HYDROcodone-acetaminophen 5-325 MG tablet Commonly known as: NORCO/VICODIN Take 1-2 tablets by mouth every 4 (four) hours as needed for moderate pain.   metFORMIN 500 MG 24 hr tablet Commonly known as: GLUCOPHAGE-XR Take 500 mg by mouth daily with breakfast.   methazolamide 50 MG tablet Commonly known as: NEPTAZANE Take 50 mg by mouth 2 (two) times daily.   mupirocin ointment 2 % Commonly known as: BACTROBAN Apply 1 application topically daily.   simvastatin 20 MG tablet Commonly known as: ZOCOR Take 20 mg by mouth daily at 6 PM.   Spiriva HandiHaler 18 MCG inhalation capsule Generic drug: tiotropium Place 18 mcg into inhaler and inhale daily.   tamsulosin 0.4 MG Caps capsule Commonly known as: FLOMAX Take 0.4 mg by mouth daily.   trazodone 300 MG tablet Commonly known as: DESYREL Take 300 mg by mouth at bedtime.   verapamil 240 MG CR tablet Commonly known as: CALAN-SR Take 240 mg by mouth 2 (two) times daily.               Durable Medical Equipment  (From admission, onward)           Start     Ordered   12/30/20 1030  For home use only DME Cane  Once        12/30/20 1030            No Known Allergies  Consultations: Podiatry   Procedures/Studies: MR FOOT RIGHT W WO CONTRAST  Result Date: 12/24/2020 CLINICAL DATA:  Diabetic foot ulcer. EXAM: MRI OF THE RIGHT FOREFOOT WITHOUT AND WITH CONTRAST TECHNIQUE: Multiplanar, multisequence MR imaging of the right foot was performed before and  after the administration of intravenous contrast. CONTRAST:  11mL GADAVIST GADOBUTROL 1 MMOL/ML IV SOLN COMPARISON:  Radiographs, same date. FINDINGS: Diffuse subcutaneous soft tissue swelling/edema/fluid mainly along the dorsum of the foot consistent with cellulitis. No discrete fluid collection to suggest a drainable abscess. There is also moderate diffuse myofasciitis without findings suspicious for pyomyositis. Small open wound is noted along the lateral aspect of the fifth PIP joint. There is associated abnormal marrow signal in the proximal and middle phalanges of the fifth digit and subsequent contrast enhancement consistent with osteomyelitis. There is likely septic arthritis at the PIP joint. The other bony structures are grossly intact. There are moderate degenerative changes at the first MTP joint with joint space narrowing, osteophytic spurring and subchondral cystic change. IMPRESSION: 1. Small open wound along the lateral aspect of the fifth PIP joint with underlying osteomyelitis involving the proximal and middle phalanges of the fifth digit. Probable septic arthritis at the PIP joint. 2. Cellulitis and myofasciitis without findings for drainable abscess. 3. Moderate degenerative changes at the first MTP joint. Electronically Signed   By:  Marijo Sanes M.D.   On: 12/24/2020 05:03   DG Foot Complete Right  Result Date: 12/23/2020 Please see detailed radiograph report in office note.     Subjective: Seen and examined the day of discharge.  Stable no distress.  Pain well controlled.  All questions answered.  Stable for discharge home.  Discharge Exam: Vitals:   12/30/20 0300 12/30/20 0812  BP: 136/81 (!) 147/96  Pulse: 66 (!) 59  Resp: 15 16  Temp: 97.6 F (36.4 C) 97.9 F (36.6 C)  SpO2: 96% 98%   Vitals:   12/29/20 1832 12/29/20 1856 12/30/20 0300 12/30/20 0812  BP: (!) 158/109 (!) 170/108 136/81 (!) 147/96  Pulse: 72 80 66 (!) 59  Resp: 16 16 15 16   Temp: (!) 97.5 F (36.4  C) 97.8 F (36.6 C) 97.6 F (36.4 C) 97.9 F (36.6 C)  TempSrc: Oral  Oral Oral  SpO2: 95% 96% 96% 98%  Weight:      Height:        General: Pt is alert, awake, not in acute distress Cardiovascular: RRR, S1/S2 +, no rubs, no gallops Respiratory: CTA bilaterally, no wheezing, no rhonchi Abdominal: Soft, NT, ND, bowel sounds + Extremities: Right foot status post fifth toe amputation    The results of significant diagnostics from this hospitalization (including imaging, microbiology, ancillary and laboratory) are listed below for reference.     Microbiology: Recent Results (from the past 240 hour(s))  Culture, blood (routine x 2)     Status: None   Collection Time: 12/23/20  3:37 PM   Specimen: BLOOD  Result Value Ref Range Status   Specimen Description BLOOD RIGHT ANTECUBITAL  Final   Special Requests   Final    BOTTLES DRAWN AEROBIC AND ANAEROBIC Blood Culture adequate volume   Culture   Final    NO GROWTH 5 DAYS Performed at [facility], [address], [place], [place] [address]    Report Status 12/28/2020 FINAL  Final  Culture, blood (routine x 2)     Status: None   Collection Time: 12/23/20  8:49 PM   Specimen: BLOOD  Result Value Ref Range Status   Specimen Description BLOOD RIGHT FOREARM  Final   Special Requests   Final    BOTTLES DRAWN AEROBIC AND ANAEROBIC Blood Culture adequate volume   Culture   Final    NO GROWTH 5 DAYS Performed at [facility], [address] [place], [place] [address]    Report Status 12/28/2020 FINAL  Final  Resp Panel by RT-PCR (Flu A&B, Covid) Nasopharyngeal Swab     Status: None   Collection Time: 12/23/20  8:49 PM   Specimen: Nasopharyngeal Swab; Nasopharyngeal(NP) swabs in vial transport medium  Result Value Ref Range Status   SARS Coronavirus 2 by RT PCR NEGATIVE NEGATIVE Final    Comment: (NOTE) SARS-CoV-2 target nucleic acids are NOT DETECTED.  The SARS-CoV-2 RNA is generally detectable in upper  respiratory specimens during the acute phase of infection. The lowest concentration of SARS-CoV-2 viral copies this assay can detect is 138 copies/mL. A negative result does not preclude SARS-Cov-2 infection and should not be used as the sole basis for treatment or other patient management decisions. A negative result may occur with  improper specimen collection/handling, submission of specimen other than nasopharyngeal swab, presence of viral mutation(s) within the areas targeted by this assay, and inadequate number of viral copies(<138 copies/mL). A negative result must be combined with clinical observations, patient history, and  epidemiological information. The expected result is Negative.  Fact Sheet for Patients:  EntrepreneurPulse.com.au  Fact Sheet for Healthcare Providers:  IncredibleEmployment.be  This test is no t yet approved or cleared by the [place] FDA and  has been authorized for detection and/or diagnosis of SARS-CoV-2 by FDA under an Emergency Use Authorization (EUA). This EUA will remain  in effect (meaning this test can be used) for the duration of the COVID-19 declaration under Section 564(b)(1) of the Act, 21 U.S.C.section 360bbb-3(b)(1), unless the authorization is terminated  or revoked sooner.       Influenza A by PCR NEGATIVE NEGATIVE Final   Influenza B by PCR NEGATIVE NEGATIVE Final    Comment: (NOTE) The Xpert Xpress SARS-CoV-2/FLU/RSV plus assay is intended as an aid in the diagnosis of influenza from Nasopharyngeal swab specimens and should not be used as a sole basis for treatment. Nasal washings and aspirates are unacceptable for Xpert Xpress SARS-CoV-2/FLU/RSV testing.  Fact Sheet for Patients: EntrepreneurPulse.com.au  Fact Sheet for Healthcare Providers: IncredibleEmployment.be  This test is not yet approved or cleared by the [place] FDA and has been  authorized for detection and/or diagnosis of SARS-CoV-2 by FDA under an Emergency Use Authorization (EUA). This EUA will remain in effect (meaning this test can be used) for the duration of the COVID-19 declaration under Section 564(b)(1) of the Act, 21 U.S.C. section 360bbb-3(b)(1), unless the authorization is terminated or revoked.  Performed at [facility], [address] [place], [place] [address]   Resp Panel by RT-PCR (Flu A&B, Covid) Nasopharyngeal Swab     Status: None   Collection Time: 12/29/20  5:50 AM   Specimen: Nasopharyngeal Swab; Nasopharyngeal(NP) swabs in vial transport medium  Result Value Ref Range Status   SARS Coronavirus 2 by RT PCR NEGATIVE NEGATIVE Final    Comment: (NOTE) SARS-CoV-2 target nucleic acids are NOT DETECTED.  The SARS-CoV-2 RNA is generally detectable in upper respiratory specimens during the acute phase of infection. The lowest concentration of SARS-CoV-2 viral copies this assay can detect is 138 copies/mL. A negative result does not preclude SARS-Cov-2 infection and should not be used as the sole basis for treatment or other patient management decisions. A negative result may occur with  improper specimen collection/handling, submission of specimen other than nasopharyngeal swab, presence of viral mutation(s) within the areas targeted by this assay, and inadequate number of viral copies(<138 copies/mL). A negative result must be combined with clinical observations, patient history, and epidemiological information. The expected result is Negative.  Fact Sheet for Patients:  EntrepreneurPulse.com.au  Fact Sheet for Healthcare Providers:  IncredibleEmployment.be  This test is no t yet approved or cleared by the [place] FDA and  has been authorized for detection and/or diagnosis of SARS-CoV-2 by FDA under an Emergency Use Authorization (EUA). This EUA will remain  in effect (meaning this  test can be used) for the duration of the COVID-19 declaration under Section 564(b)(1) of the Act, 21 U.S.C.section 360bbb-3(b)(1), unless the authorization is terminated  or revoked sooner.       Influenza A by PCR NEGATIVE NEGATIVE Final   Influenza B by PCR NEGATIVE NEGATIVE Final    Comment: (NOTE) The Xpert Xpress SARS-CoV-2/FLU/RSV plus assay is intended as an aid in the diagnosis of influenza from Nasopharyngeal swab specimens and should not be used as a sole basis for treatment. Nasal washings and aspirates are unacceptable for Xpert Xpress SARS-CoV-2/FLU/RSV testing.  Fact Sheet for Patients: EntrepreneurPulse.com.au  Fact Sheet for Healthcare  Providers: IncredibleEmployment.be  This test is not yet approved or cleared by the [place] and has been authorized for detection and/or diagnosis of SARS-CoV-2 by FDA under an Emergency Use Authorization (EUA). This EUA will remain in effect (meaning this test can be used) for the duration of the COVID-19 declaration under Section 564(b)(1) of the Act, 21 U.S.C. section 360bbb-3(b)(1), unless the authorization is terminated or revoked.  Performed at [facility], [address] [place], [place] [address]      Labs: BNP (last 3 results) No results for input(s): BNP in the last 8760 hours. Basic Metabolic Panel: Recent Labs  Lab 12/23/20 1537 12/28/20 1951 12/30/20 0255  NA 135 138 137  K 4.0 3.6 3.9  CL 103 107 106  CO2 25 27 22   GLUCOSE 146* 143* 160*  BUN 14 16 14   CREATININE 1.01 0.95 0.83  CALCIUM 9.5 9.4 8.8*   Liver Function Tests: Recent Labs  Lab 12/28/20 1951  AST 17  ALT 16  ALKPHOS 82  BILITOT 0.5  PROT 6.7  ALBUMIN 4.1   No results for input(s): LIPASE, AMYLASE in the last 168 hours. No results for input(s): AMMONIA in the last 168 hours. CBC: Recent Labs  Lab 12/23/20 1537 12/28/20 1951 12/30/20 0255  WBC 8.6 7.8 8.4  NEUTROABS  6.0  --   --   HGB 15.1 13.1 13.5  HCT 42.8 37.3* 38.6*  MCV 93.0 92.8 91.9  PLT 202 192 201   Cardiac Enzymes: No results for input(s): CKTOTAL, CKMB, CKMBINDEX, TROPONINI in the last 168 hours. BNP: Invalid input(s): POCBNP CBG: Recent Labs  Lab 12/29/20 1002 12/29/20 1337 12/29/20 2117 12/30/20 0724 12/30/20 1120  GLUCAP 124* 120* 181* 130* 154*   D-Dimer No results for input(s): DDIMER in the last 72 hours. Hgb A1c No results for input(s): HGBA1C in the last 72 hours. Lipid Profile No results for input(s): CHOL, HDL, LDLCALC, TRIG, CHOLHDL, LDLDIRECT in the last 72 hours. Thyroid function studies No results for input(s): TSH, T4TOTAL, T3FREE, THYROIDAB in the last 72 hours.  Invalid input(s): FREET3 Anemia work up No results for input(s): VITAMINB12, FOLATE, FERRITIN, TIBC, IRON, RETICCTPCT in the last 72 hours. Urinalysis No results found for: COLORURINE, APPEARANCEUR, [place], [place], [place], [place], [place], [place], [place], UROBILINOGEN, NITRITE, LEUKOCYTESUR Sepsis Labs Invalid input(s): PROCALCITONIN,  WBC,  LACTICIDVEN Microbiology Recent Results (from the past 240 hour(s))  Culture, blood (routine x 2)     Status: None   Collection Time: 12/23/20  3:37 PM   Specimen: BLOOD  Result Value Ref Range Status   Specimen Description BLOOD RIGHT ANTECUBITAL  Final   Special Requests   Final    BOTTLES DRAWN AEROBIC AND ANAEROBIC Blood Culture adequate volume   Culture   Final    NO GROWTH 5 DAYS Performed at [facility], [address] [place], [place] [address]    Report Status 12/28/2020 FINAL  Final  Culture, blood (routine x 2)     Status: None   Collection Time: 12/23/20  8:49 PM   Specimen: BLOOD  Result Value Ref Range Status   Specimen Description BLOOD RIGHT FOREARM  Final   Special Requests   Final    BOTTLES DRAWN AEROBIC AND ANAEROBIC Blood Culture adequate volume   Culture   Final    NO GROWTH 5 DAYS Performed at  [facility], [address], [place], [place] [address]    Report Status 12/28/2020 FINAL  Final  Resp Panel by  RT-PCR (Flu A&B, Covid) Nasopharyngeal Swab     Status: None   Collection Time: 12/23/20  8:49 PM   Specimen: Nasopharyngeal Swab; Nasopharyngeal(NP) swabs in vial transport medium  Result Value Ref Range Status   SARS Coronavirus 2 by RT PCR NEGATIVE NEGATIVE Final    Comment: (NOTE) SARS-CoV-2 target nucleic acids are NOT DETECTED.  The SARS-CoV-2 RNA is generally detectable in upper respiratory specimens during the acute phase of infection. The lowest concentration of SARS-CoV-2 viral copies this assay can detect is 138 copies/mL. A negative result does not preclude SARS-Cov-2 infection and should not be used as the sole basis for treatment or other patient management decisions. A negative result may occur with  improper specimen collection/handling, submission of specimen other than nasopharyngeal swab, presence of viral mutation(s) within the areas targeted by this assay, and inadequate number of viral copies(<138 copies/mL). A negative result must be combined with clinical observations, patient history, and epidemiological information. The expected result is Negative.  Fact Sheet for Patients:  EntrepreneurPulse.com.au  Fact Sheet for Healthcare Providers:  IncredibleEmployment.be  This test is no t yet approved or cleared by the [place] FDA and  has been authorized for detection and/or diagnosis of SARS-CoV-2 by FDA under an Emergency Use Authorization (EUA). This EUA will remain  in effect (meaning this test can be used) for the duration of the COVID-19 declaration under Section 564(b)(1) of the Act, 21 U.S.C.section 360bbb-3(b)(1), unless the authorization is terminated  or revoked sooner.       Influenza A by PCR NEGATIVE NEGATIVE Final   Influenza B by PCR NEGATIVE NEGATIVE Final    Comment: (NOTE) The  Xpert Xpress SARS-CoV-2/FLU/RSV plus assay is intended as an aid in the diagnosis of influenza from Nasopharyngeal swab specimens and should not be used as a sole basis for treatment. Nasal washings and aspirates are unacceptable for Xpert Xpress SARS-CoV-2/FLU/RSV testing.  Fact Sheet for Patients: EntrepreneurPulse.com.au  Fact Sheet for Healthcare Providers: IncredibleEmployment.be  This test is not yet approved or cleared by the [place] FDA and has been authorized for detection and/or diagnosis of SARS-CoV-2 by FDA under an Emergency Use Authorization (EUA). This EUA will remain in effect (meaning this test can be used) for the duration of the COVID-19 declaration under Section 564(b)(1) of the Act, 21 U.S.C. section 360bbb-3(b)(1), unless the authorization is terminated or revoked.  Performed at [facility], [address] [place], [place] [address]   Resp Panel by RT-PCR (Flu A&B, Covid) Nasopharyngeal Swab     Status: None   Collection Time: 12/29/20  5:50 AM   Specimen: Nasopharyngeal Swab; Nasopharyngeal(NP) swabs in vial transport medium  Result Value Ref Range Status   SARS Coronavirus 2 by RT PCR NEGATIVE NEGATIVE Final    Comment: (NOTE) SARS-CoV-2 target nucleic acids are NOT DETECTED.  The SARS-CoV-2 RNA is generally detectable in upper respiratory specimens during the acute phase of infection. The lowest concentration of SARS-CoV-2 viral copies this assay can detect is 138 copies/mL. A negative result does not preclude SARS-Cov-2 infection and should not be used as the sole basis for treatment or other patient management decisions. A negative result may occur with  improper specimen collection/handling, submission of specimen other than nasopharyngeal swab, presence of viral mutation(s) within the areas targeted by this assay, and inadequate number of viral copies(<138 copies/mL). A negative result must be  combined with clinical observations, patient history, and epidemiological information. The expected result is Negative.  Fact Sheet for Patients:  EntrepreneurPulse.com.au  Fact Sheet for Healthcare Providers:  IncredibleEmployment.be  This test is no t yet approved or cleared by the [place] FDA and  has been authorized for detection and/or diagnosis of SARS-CoV-2 by FDA under an Emergency Use Authorization (EUA). This EUA will remain  in effect (meaning this test can be used) for the duration of the COVID-19 declaration under Section 564(b)(1) of the Act, 21 U.S.C.section 360bbb-3(b)(1), unless the authorization is terminated  or revoked sooner.       Influenza A by PCR NEGATIVE NEGATIVE Final   Influenza B by PCR NEGATIVE NEGATIVE Final    Comment: (NOTE) The Xpert Xpress SARS-CoV-2/FLU/RSV plus assay is intended as an aid in the diagnosis of influenza from Nasopharyngeal swab specimens and should not be used as a sole basis for treatment. Nasal washings and aspirates are unacceptable for Xpert Xpress SARS-CoV-2/FLU/RSV testing.  Fact Sheet for Patients: EntrepreneurPulse.com.au  Fact Sheet for Healthcare Providers: IncredibleEmployment.be  This test is not yet approved or cleared by the [place] FDA and has been authorized for detection and/or diagnosis of SARS-CoV-2 by FDA under an Emergency Use Authorization (EUA). This EUA will remain in effect (meaning this test can be used) for the duration of the COVID-19 declaration under Section 564(b)(1) of the Act, 21 U.S.C. section 360bbb-3(b)(1), unless the authorization is terminated or revoked.  Performed at [facility], [address], [place], [place] [address]      Time coordinating discharge: Over 30 minutes  SIGNED:   Sidney Ace, MD  [facility] 12/30/2020, 3:06 PM Pager   If 7PM-7AM, please  contact night-coverage

## 2020-12-30 NOTE — TOC CM/SW Note (Addendum)
Patient has orders to discharge home today. Chart reviewed. PCP is [employer], [employer]. On room air. Has an abrasion and an incision on his foot. No discharge wound care needs. PT recommending a cane. Patient is agreeable. Ordered through [employer] to be delivered to the room before he leaves. Patient said his neighbor will pick him up. No further concerns. CSW signing off.  [name], [place]

## 2020-12-30 NOTE — Progress Notes (Signed)
Patietn discharged to home.  PIV removed prior to discharge.  Reviewed discharge packet and instructions, medications, and followup appointments with patient who verbalized understanding.post op shoe  and cane given and demonstrated use.

## 2020-12-30 NOTE — Progress Notes (Deleted)
Physician Discharge Summary  [name] OFB:[id number] DOB: 08-04-1965 DOA: 12/29/2020  PCP: Doroteo Glassman, Hillary A, PA-C  Admit date: 12/29/2020 Discharge date: 12/30/2020  Admitted From: Home Disposition: Home  Recommendations for Outpatient Follow-up:  Follow up with PCP in 1-2 weeks Follow-up podiatry 1 week  Home Health: No Equipment/Devices: Surgical offloading shoe, single-point cane  Discharge Condition: Stable CODE STATUS: Full Diet recommendation: Carb modified  Brief/Interim Summary:   55 y.o. male with medical history significant for non-insulin-dependent diabetes mellitus, nicotine dependence, spinal stenosis of lumbar region, BPH, dyslipidemia who presents to the emergency room for definitive treatment for his known right  The. Patient was seen in the emergency room on 12/23/20 after he was referred by his podiatrist due to concerns of possible osteomyelitis involving the right fifth toe that had a weeping ulcer with increased purulent drainage. Patient had an MRI on 10/12 which showed cellulitis and myositis without findings for drainable abscess.  Small open wound along the lateral aspect of the fifth PIP joint with underlying osteomyelitis involving the proximal and middle phalanges of the fifth digit.  Probable septic arthritis at the PIP joint. He was seen by podiatry who recommended amputation of the right fifth digit.  Patient underwent amputation of right fifth toe on 10/18 with Dr. Amalia Hailey from podiatry.  No postoperative complications.  Seen on postoperative day 1.  Discussed care with podiatry consulted Dr. Sherryle Lis.  Cleared for discharge from their standpoint.  Recommend 2 weeks of Augmentin 875/125 on discharge.  This is been prescribed.  Therapy evaluations requested.  Patient ambulating without difficulty.  Pain well controlled.  Stable for discharge at this time.  We will follow-up with podiatry postdischarge.   Discharge Diagnoses:  Principal Problem:    Osteomyelitis of fifth toe of right foot ([place]) Active Problems:   COPD (chronic obstructive pulmonary disease) (HCC)   HTN (hypertension)   Spinal stenosis of lumbar region   Type 2 diabetes mellitus without complication, without long-term current use of insulin (HCC)   Diabetic ulcer of right foot associated with diabetes mellitus due to underlying condition ([place])   Tobacco abuse   Depression  Right fifth toe osteomyelitis  Patient was seen in the emergency room and podiatry was consulted.  Recommended right fifth toe amputation.  Patient successfully underwent this procedure on 10/18.  No postoperative complications.  Pain well controlled on postoperative day 1.  Stable for discharge home.  Discussed with podiatry Dr. Sherryle Lis.  Recommend 2 weeks of Augmentin 875/125.  This is prescribed on discharge.  Patient was also evaluated with physical therapy.  Ambulating freely without too much pain.  Recommend surgical offloading shoe and single-point cane.  This is been ordered on discharge.  Stable for discharge home at this time.  Follow-up documented.  Discharge Instructions  Discharge Instructions     Diet - low sodium heart healthy   Complete by: As directed    Increase activity slowly   Complete by: As directed    No wound care   Complete by: As directed       Allergies as of 12/30/2020   No Known Allergies      Medication List     TAKE these medications    acyclovir 400 MG tablet Commonly known as: ZOVIRAX Take 400 mg by mouth 2 (two) times daily.   albuterol 108 (90 Base) MCG/ACT inhaler Commonly known as: VENTOLIN HFA Inhale into the lungs.   amoxicillin-clavulanate 875-125 MG tablet Commonly known as: Augmentin Take 1 tablet by  mouth 2 (two) times daily for 14 days.   brimonidine 0.15 % ophthalmic solution Commonly known as: ALPHAGAN 1 drop in the morning and at bedtime.   buPROPion ER 100 MG 12 hr tablet Commonly known as: WELLBUTRIN SR Take 100 mg by  mouth 2 (two) times daily.   diphenhydramine-acetaminophen 25-500 MG Tabs tablet Commonly known as: TYLENOL PM Take 1 tablet by mouth at bedtime as needed.   dorzolamide-timolol 22.3-6.8 MG/ML ophthalmic solution Commonly known as: COSOPT 1 drop 2 (two) times daily.   fesoterodine 4 MG Tb24 tablet Commonly known as: TOVIAZ Take 4 mg by mouth daily.   gabapentin 100 MG capsule Commonly known as: NEURONTIN Take 100 mg by mouth 2 (two) times daily.   HYDROcodone-acetaminophen 5-325 MG tablet Commonly known as: NORCO/VICODIN Take 1-2 tablets by mouth every 4 (four) hours as needed for moderate pain.   metFORMIN 500 MG 24 hr tablet Commonly known as: GLUCOPHAGE-XR Take 500 mg by mouth daily with breakfast.   methazolamide 50 MG tablet Commonly known as: NEPTAZANE Take 50 mg by mouth 2 (two) times daily.   mupirocin ointment 2 % Commonly known as: BACTROBAN Apply 1 application topically daily.   simvastatin 20 MG tablet Commonly known as: ZOCOR Take 20 mg by mouth daily at 6 PM.   Spiriva HandiHaler 18 MCG inhalation capsule Generic drug: tiotropium Place 18 mcg into inhaler and inhale daily.   tamsulosin 0.4 MG Caps capsule Commonly known as: FLOMAX Take 0.4 mg by mouth daily.   trazodone 300 MG tablet Commonly known as: DESYREL Take 300 mg by mouth at bedtime.   verapamil 240 MG CR tablet Commonly known as: CALAN-SR Take 240 mg by mouth 2 (two) times daily.               Durable Medical Equipment  (From admission, onward)           Start     Ordered   12/30/20 1030  For home use only DME Cane  Once        12/30/20 1030            No Known Allergies  Consultations: Podiatry   Procedures/Studies: MR FOOT RIGHT W WO CONTRAST  Result Date: 12/24/2020 CLINICAL DATA:  Diabetic foot ulcer. EXAM: MRI OF THE RIGHT FOREFOOT WITHOUT AND WITH CONTRAST TECHNIQUE: Multiplanar, multisequence MR imaging of the right foot was performed before and  after the administration of intravenous contrast. CONTRAST:  67mL GADAVIST GADOBUTROL 1 MMOL/ML IV SOLN COMPARISON:  Radiographs, same date. FINDINGS: Diffuse subcutaneous soft tissue swelling/edema/fluid mainly along the dorsum of the foot consistent with cellulitis. No discrete fluid collection to suggest a drainable abscess. There is also moderate diffuse myofasciitis without findings suspicious for pyomyositis. Small open wound is noted along the lateral aspect of the fifth PIP joint. There is associated abnormal marrow signal in the proximal and middle phalanges of the fifth digit and subsequent contrast enhancement consistent with osteomyelitis. There is likely septic arthritis at the PIP joint. The other bony structures are grossly intact. There are moderate degenerative changes at the first MTP joint with joint space narrowing, osteophytic spurring and subchondral cystic change. IMPRESSION: 1. Small open wound along the lateral aspect of the fifth PIP joint with underlying osteomyelitis involving the proximal and middle phalanges of the fifth digit. Probable septic arthritis at the PIP joint. 2. Cellulitis and myofasciitis without findings for drainable abscess. 3. Moderate degenerative changes at the first MTP joint. Electronically Signed   By:  Marijo Sanes M.D.   On: 12/24/2020 05:03   DG Foot Complete Right  Result Date: 12/23/2020 Please see detailed radiograph report in office note.     Subjective: Seen and examined the day of discharge.  Stable no distress.  Pain well controlled.  All questions answered.  Stable for discharge home.  Discharge Exam: Vitals:   12/30/20 0300 12/30/20 0812  BP: 136/81 (!) 147/96  Pulse: 66 (!) 59  Resp: 15 16  Temp: 97.6 F (36.4 C) 97.9 F (36.6 C)  SpO2: 96% 98%   Vitals:   12/29/20 1832 12/29/20 1856 12/30/20 0300 12/30/20 0812  BP: (!) 158/109 (!) 170/108 136/81 (!) 147/96  Pulse: 72 80 66 (!) 59  Resp: 16 16 15 16   Temp: (!) 97.5 F (36.4  C) 97.8 F (36.6 C) 97.6 F (36.4 C) 97.9 F (36.6 C)  TempSrc: Oral  Oral Oral  SpO2: 95% 96% 96% 98%  Weight:      Height:        General: Pt is alert, awake, not in acute distress Cardiovascular: RRR, S1/S2 +, no rubs, no gallops Respiratory: CTA bilaterally, no wheezing, no rhonchi Abdominal: Soft, NT, ND, bowel sounds + Extremities: Right foot status post fifth toe amputation    The results of significant diagnostics from this hospitalization (including imaging, microbiology, ancillary and laboratory) are listed below for reference.     Microbiology: Recent Results (from the past 240 hour(s))  Culture, blood (routine x 2)     Status: None   Collection Time: 12/23/20  3:37 PM   Specimen: BLOOD  Result Value Ref Range Status   Specimen Description BLOOD RIGHT ANTECUBITAL  Final   Special Requests   Final    BOTTLES DRAWN AEROBIC AND ANAEROBIC Blood Culture adequate volume   Culture   Final    NO GROWTH 5 DAYS Performed at [facility], [address], [place], [place] [address]    Report Status 12/28/2020 FINAL  Final  Culture, blood (routine x 2)     Status: None   Collection Time: 12/23/20  8:49 PM   Specimen: BLOOD  Result Value Ref Range Status   Specimen Description BLOOD RIGHT FOREARM  Final   Special Requests   Final    BOTTLES DRAWN AEROBIC AND ANAEROBIC Blood Culture adequate volume   Culture   Final    NO GROWTH 5 DAYS Performed at [facility], [address] [place], [place] [address]    Report Status 12/28/2020 FINAL  Final  Resp Panel by RT-PCR (Flu A&B, Covid) Nasopharyngeal Swab     Status: None   Collection Time: 12/23/20  8:49 PM   Specimen: Nasopharyngeal Swab; Nasopharyngeal(NP) swabs in vial transport medium  Result Value Ref Range Status   SARS Coronavirus 2 by RT PCR NEGATIVE NEGATIVE Final    Comment: (NOTE) SARS-CoV-2 target nucleic acids are NOT DETECTED.  The SARS-CoV-2 RNA is generally detectable in upper  respiratory specimens during the acute phase of infection. The lowest concentration of SARS-CoV-2 viral copies this assay can detect is 138 copies/mL. A negative result does not preclude SARS-Cov-2 infection and should not be used as the sole basis for treatment or other patient management decisions. A negative result may occur with  improper specimen collection/handling, submission of specimen other than nasopharyngeal swab, presence of viral mutation(s) within the areas targeted by this assay, and inadequate number of viral copies(<138 copies/mL). A negative result must be combined with clinical observations, patient history, and  epidemiological information. The expected result is Negative.  Fact Sheet for Patients:  EntrepreneurPulse.com.au  Fact Sheet for Healthcare Providers:  IncredibleEmployment.be  This test is no t yet approved or cleared by the [place] FDA and  has been authorized for detection and/or diagnosis of SARS-CoV-2 by FDA under an Emergency Use Authorization (EUA). This EUA will remain  in effect (meaning this test can be used) for the duration of the COVID-19 declaration under Section 564(b)(1) of the Act, 21 U.S.C.section 360bbb-3(b)(1), unless the authorization is terminated  or revoked sooner.       Influenza A by PCR NEGATIVE NEGATIVE Final   Influenza B by PCR NEGATIVE NEGATIVE Final    Comment: (NOTE) The Xpert Xpress SARS-CoV-2/FLU/RSV plus assay is intended as an aid in the diagnosis of influenza from Nasopharyngeal swab specimens and should not be used as a sole basis for treatment. Nasal washings and aspirates are unacceptable for Xpert Xpress SARS-CoV-2/FLU/RSV testing.  Fact Sheet for Patients: EntrepreneurPulse.com.au  Fact Sheet for Healthcare Providers: IncredibleEmployment.be  This test is not yet approved or cleared by the [place] FDA and has been  authorized for detection and/or diagnosis of SARS-CoV-2 by FDA under an Emergency Use Authorization (EUA). This EUA will remain in effect (meaning this test can be used) for the duration of the COVID-19 declaration under Section 564(b)(1) of the Act, 21 U.S.C. section 360bbb-3(b)(1), unless the authorization is terminated or revoked.  Performed at [facility], [address] [place], [place] [address]   Resp Panel by RT-PCR (Flu A&B, Covid) Nasopharyngeal Swab     Status: None   Collection Time: 12/29/20  5:50 AM   Specimen: Nasopharyngeal Swab; Nasopharyngeal(NP) swabs in vial transport medium  Result Value Ref Range Status   SARS Coronavirus 2 by RT PCR NEGATIVE NEGATIVE Final    Comment: (NOTE) SARS-CoV-2 target nucleic acids are NOT DETECTED.  The SARS-CoV-2 RNA is generally detectable in upper respiratory specimens during the acute phase of infection. The lowest concentration of SARS-CoV-2 viral copies this assay can detect is 138 copies/mL. A negative result does not preclude SARS-Cov-2 infection and should not be used as the sole basis for treatment or other patient management decisions. A negative result may occur with  improper specimen collection/handling, submission of specimen other than nasopharyngeal swab, presence of viral mutation(s) within the areas targeted by this assay, and inadequate number of viral copies(<138 copies/mL). A negative result must be combined with clinical observations, patient history, and epidemiological information. The expected result is Negative.  Fact Sheet for Patients:  EntrepreneurPulse.com.au  Fact Sheet for Healthcare Providers:  IncredibleEmployment.be  This test is no t yet approved or cleared by the [place] FDA and  has been authorized for detection and/or diagnosis of SARS-CoV-2 by FDA under an Emergency Use Authorization (EUA). This EUA will remain  in effect (meaning this  test can be used) for the duration of the COVID-19 declaration under Section 564(b)(1) of the Act, 21 U.S.C.section 360bbb-3(b)(1), unless the authorization is terminated  or revoked sooner.       Influenza A by PCR NEGATIVE NEGATIVE Final   Influenza B by PCR NEGATIVE NEGATIVE Final    Comment: (NOTE) The Xpert Xpress SARS-CoV-2/FLU/RSV plus assay is intended as an aid in the diagnosis of influenza from Nasopharyngeal swab specimens and should not be used as a sole basis for treatment. Nasal washings and aspirates are unacceptable for Xpert Xpress SARS-CoV-2/FLU/RSV testing.  Fact Sheet for Patients: EntrepreneurPulse.com.au  Fact Sheet for Healthcare  Providers: IncredibleEmployment.be  This test is not yet approved or cleared by the [place] and has been authorized for detection and/or diagnosis of SARS-CoV-2 by FDA under an Emergency Use Authorization (EUA). This EUA will remain in effect (meaning this test can be used) for the duration of the COVID-19 declaration under Section 564(b)(1) of the Act, 21 U.S.C. section 360bbb-3(b)(1), unless the authorization is terminated or revoked.  Performed at [facility], [address] [place], [place] [address]      Labs: BNP (last 3 results) No results for input(s): BNP in the last 8760 hours. Basic Metabolic Panel: Recent Labs  Lab 12/23/20 1537 12/28/20 1951 12/30/20 0255  NA 135 138 137  K 4.0 3.6 3.9  CL 103 107 106  CO2 25 27 22   GLUCOSE 146* 143* 160*  BUN 14 16 14   CREATININE 1.01 0.95 0.83  CALCIUM 9.5 9.4 8.8*   Liver Function Tests: Recent Labs  Lab 12/28/20 1951  AST 17  ALT 16  ALKPHOS 82  BILITOT 0.5  PROT 6.7  ALBUMIN 4.1   No results for input(s): LIPASE, AMYLASE in the last 168 hours. No results for input(s): AMMONIA in the last 168 hours. CBC: Recent Labs  Lab 12/23/20 1537 12/28/20 1951 12/30/20 0255  WBC 8.6 7.8 8.4  NEUTROABS  6.0  --   --   HGB 15.1 13.1 13.5  HCT 42.8 37.3* 38.6*  MCV 93.0 92.8 91.9  PLT 202 192 201   Cardiac Enzymes: No results for input(s): CKTOTAL, CKMB, CKMBINDEX, TROPONINI in the last 168 hours. BNP: Invalid input(s): POCBNP CBG: Recent Labs  Lab 12/29/20 1002 12/29/20 1337 12/29/20 2117 12/30/20 0724 12/30/20 1120  GLUCAP 124* 120* 181* 130* 154*   D-Dimer No results for input(s): DDIMER in the last 72 hours. Hgb A1c No results for input(s): HGBA1C in the last 72 hours. Lipid Profile No results for input(s): CHOL, HDL, LDLCALC, TRIG, CHOLHDL, LDLDIRECT in the last 72 hours. Thyroid function studies No results for input(s): TSH, T4TOTAL, T3FREE, THYROIDAB in the last 72 hours.  Invalid input(s): FREET3 Anemia work up No results for input(s): VITAMINB12, FOLATE, FERRITIN, TIBC, IRON, RETICCTPCT in the last 72 hours. Urinalysis No results found for: COLORURINE, APPEARANCEUR, [place], [place], [place], [place], [place], [place], [place], UROBILINOGEN, NITRITE, LEUKOCYTESUR Sepsis Labs Invalid input(s): PROCALCITONIN,  WBC,  LACTICIDVEN Microbiology Recent Results (from the past 240 hour(s))  Culture, blood (routine x 2)     Status: None   Collection Time: 12/23/20  3:37 PM   Specimen: BLOOD  Result Value Ref Range Status   Specimen Description BLOOD RIGHT ANTECUBITAL  Final   Special Requests   Final    BOTTLES DRAWN AEROBIC AND ANAEROBIC Blood Culture adequate volume   Culture   Final    NO GROWTH 5 DAYS Performed at [facility], [address], [place], [place] [address]    Report Status 12/28/2020 FINAL  Final  Culture, blood (routine x 2)     Status: None   Collection Time: 12/23/20  8:49 PM   Specimen: BLOOD  Result Value Ref Range Status   Specimen Description BLOOD RIGHT FOREARM  Final   Special Requests   Final    BOTTLES DRAWN AEROBIC AND ANAEROBIC Blood Culture adequate volume   Culture   Final    NO GROWTH 5 DAYS Performed at  [facility], [address] [place], [place] [address]    Report Status 12/28/2020 FINAL  Final  Resp Panel by  RT-PCR (Flu A&B, Covid) Nasopharyngeal Swab     Status: None   Collection Time: 12/23/20  8:49 PM   Specimen: Nasopharyngeal Swab; Nasopharyngeal(NP) swabs in vial transport medium  Result Value Ref Range Status   SARS Coronavirus 2 by RT PCR NEGATIVE NEGATIVE Final    Comment: (NOTE) SARS-CoV-2 target nucleic acids are NOT DETECTED.  The SARS-CoV-2 RNA is generally detectable in upper respiratory specimens during the acute phase of infection. The lowest concentration of SARS-CoV-2 viral copies this assay can detect is 138 copies/mL. A negative result does not preclude SARS-Cov-2 infection and should not be used as the sole basis for treatment or other patient management decisions. A negative result may occur with  improper specimen collection/handling, submission of specimen other than nasopharyngeal swab, presence of viral mutation(s) within the areas targeted by this assay, and inadequate number of viral copies(<138 copies/mL). A negative result must be combined with clinical observations, patient history, and epidemiological information. The expected result is Negative.  Fact Sheet for Patients:  EntrepreneurPulse.com.au  Fact Sheet for Healthcare Providers:  IncredibleEmployment.be  This test is no t yet approved or cleared by the [place] FDA and  has been authorized for detection and/or diagnosis of SARS-CoV-2 by FDA under an Emergency Use Authorization (EUA). This EUA will remain  in effect (meaning this test can be used) for the duration of the COVID-19 declaration under Section 564(b)(1) of the Act, 21 U.S.C.section 360bbb-3(b)(1), unless the authorization is terminated  or revoked sooner.       Influenza A by PCR NEGATIVE NEGATIVE Final   Influenza B by PCR NEGATIVE NEGATIVE Final    Comment: (NOTE) The  Xpert Xpress SARS-CoV-2/FLU/RSV plus assay is intended as an aid in the diagnosis of influenza from Nasopharyngeal swab specimens and should not be used as a sole basis for treatment. Nasal washings and aspirates are unacceptable for Xpert Xpress SARS-CoV-2/FLU/RSV testing.  Fact Sheet for Patients: EntrepreneurPulse.com.au  Fact Sheet for Healthcare Providers: IncredibleEmployment.be  This test is not yet approved or cleared by the [place] FDA and has been authorized for detection and/or diagnosis of SARS-CoV-2 by FDA under an Emergency Use Authorization (EUA). This EUA will remain in effect (meaning this test can be used) for the duration of the COVID-19 declaration under Section 564(b)(1) of the Act, 21 U.S.C. section 360bbb-3(b)(1), unless the authorization is terminated or revoked.  Performed at [facility], [address] [place], [place] [address]   Resp Panel by RT-PCR (Flu A&B, Covid) Nasopharyngeal Swab     Status: None   Collection Time: 12/29/20  5:50 AM   Specimen: Nasopharyngeal Swab; Nasopharyngeal(NP) swabs in vial transport medium  Result Value Ref Range Status   SARS Coronavirus 2 by RT PCR NEGATIVE NEGATIVE Final    Comment: (NOTE) SARS-CoV-2 target nucleic acids are NOT DETECTED.  The SARS-CoV-2 RNA is generally detectable in upper respiratory specimens during the acute phase of infection. The lowest concentration of SARS-CoV-2 viral copies this assay can detect is 138 copies/mL. A negative result does not preclude SARS-Cov-2 infection and should not be used as the sole basis for treatment or other patient management decisions. A negative result may occur with  improper specimen collection/handling, submission of specimen other than nasopharyngeal swab, presence of viral mutation(s) within the areas targeted by this assay, and inadequate number of viral copies(<138 copies/mL). A negative result must be  combined with clinical observations, patient history, and epidemiological information. The expected result is Negative.  Fact Sheet for Patients:  EntrepreneurPulse.com.au  Fact Sheet for Healthcare Providers:  IncredibleEmployment.be  This test is no t yet approved or cleared by the [place] FDA and  has been authorized for detection and/or diagnosis of SARS-CoV-2 by FDA under an Emergency Use Authorization (EUA). This EUA will remain  in effect (meaning this test can be used) for the duration of the COVID-19 declaration under Section 564(b)(1) of the Act, 21 U.S.C.section 360bbb-3(b)(1), unless the authorization is terminated  or revoked sooner.       Influenza A by PCR NEGATIVE NEGATIVE Final   Influenza B by PCR NEGATIVE NEGATIVE Final    Comment: (NOTE) The Xpert Xpress SARS-CoV-2/FLU/RSV plus assay is intended as an aid in the diagnosis of influenza from Nasopharyngeal swab specimens and should not be used as a sole basis for treatment. Nasal washings and aspirates are unacceptable for Xpert Xpress SARS-CoV-2/FLU/RSV testing.  Fact Sheet for Patients: EntrepreneurPulse.com.au  Fact Sheet for Healthcare Providers: IncredibleEmployment.be  This test is not yet approved or cleared by the [place] FDA and has been authorized for detection and/or diagnosis of SARS-CoV-2 by FDA under an Emergency Use Authorization (EUA). This EUA will remain in effect (meaning this test can be used) for the duration of the COVID-19 declaration under Section 564(b)(1) of the Act, 21 U.S.C. section 360bbb-3(b)(1), unless the authorization is terminated or revoked.  Performed at [facility], [address], [place], [place] [address]      Time coordinating discharge: Over 30 minutes  SIGNED:   Sidney Ace, MD  [facility] 12/30/2020, 12:53 PM Pager   If 7PM-7AM, please  contact night-coverage

## 2020-12-31 LAB — HEMOGLOBIN A1C
Hgb A1c MFr Bld: 5.9 % — ABNORMAL HIGH (ref 4.8–5.6)
Mean Plasma Glucose: 123 mg/dL

## 2021-01-06 ENCOUNTER — Ambulatory Visit (INDEPENDENT_AMBULATORY_CARE_PROVIDER_SITE_OTHER): Payer: Medicaid Other | Admitting: Podiatry

## 2021-01-06 ENCOUNTER — Other Ambulatory Visit: Payer: Self-pay

## 2021-01-06 DIAGNOSIS — E11621 Type 2 diabetes mellitus with foot ulcer: Secondary | ICD-10-CM

## 2021-01-06 DIAGNOSIS — Z89421 Acquired absence of other right toe(s): Secondary | ICD-10-CM

## 2021-01-06 MED ORDER — HYDROCODONE-ACETAMINOPHEN 5-325 MG PO TABS: 1.0000 | ORAL_TABLET | ORAL | 0 refills | Status: AC | PRN

## 2021-01-06 NOTE — Progress Notes (Signed)
  Subjective:  Patient ID: [name], male    DOB: 03-Dec-1965,  MRN: [id number]  Chief Complaint  Patient presents with   Routine Post Op     follow up on toe amputation     55 y.o. male returns with the above complaint. History confirmed with patient.  Overall doing well he is having some pain.  Has not changed the dressing since early.  He is WBAT in a surgical shoe right now Objective:  Physical Exam: warm, good capillary refill, no trophic changes or ulcerative lesions, and normal DP and PT pulses.  Full-thickness ulceration with exposed subcutaneous tissue submetatarsal 1  0.6 cm x 0.4 cm x 0.2 cm  ulceration with no exposed bone tendon or joint.  Fully granular wound bed.  Amputation site healing well sutures intact    MRI from hospital showing osteomyelitis of the fifth toe  New multiple view radiographs of the right foot show erosion of the distal portion of the proximal phalanx Not present on prior films  Assessment:   1. Status post amputation of lesser toe of right foot ([place])   2. Diabetic ulcer of right midfoot associated with type 2 diabetes mellitus, with fat layer exposed ([place])        Plan:  Patient was evaluated and treated and all questions answered.  Toe amputation site is healing well he can begin bathing no soaking or scrubbing the incision.  No dressing needed over the stitches.  We will remove sutures in 2 weeks.  I did send him a refill of pain medication  Ulcer left submetatarsal 1 -We discussed the etiology and factors that are a part of the wound healing process.  We also discussed the risk of infection both soft tissue and osteomyelitis from open ulceration.  Discussed the risk of limb loss if this happens or worsens. -Debridement as below. -Dressed with Iodosorb, DSD. -Continue off-loading with surgical shoe.  Continue home daily dressing changes with mupirocin ointment which he already has  Procedure: Excisional Debridement of  Wound Rationale: Removal of non-viable soft tissue from the wound to promote healing.  Anesthesia: none Post-Debridement Wound Measurements: 0.6 cm x 0.4 cm x 0.2 cm  Type of Debridement: Sharp Excisional Tissue Removed: Non-viable soft tissue Depth of Debridement: subcutaneous tissue. Technique: Sharp excisional debridement to bleeding, viable wound base.  Dressing: Dry, sterile, compression dressing. Disposition: Patient tolerated procedure well. Patient to return in 1 week for follow-up.  Return in about 2 weeks (around 01/20/2021) for wound care.       Return in about 2 weeks (around 01/20/2021) for wound care.

## 2021-01-20 ENCOUNTER — Other Ambulatory Visit: Payer: Self-pay

## 2021-01-20 ENCOUNTER — Ambulatory Visit (INDEPENDENT_AMBULATORY_CARE_PROVIDER_SITE_OTHER): Payer: Medicaid Other | Admitting: Podiatry

## 2021-01-20 ENCOUNTER — Encounter (INDEPENDENT_AMBULATORY_CARE_PROVIDER_SITE_OTHER): Payer: Self-pay

## 2021-01-20 DIAGNOSIS — L97412 Non-pressure chronic ulcer of right heel and midfoot with fat layer exposed: Secondary | ICD-10-CM | POA: Diagnosis not present

## 2021-01-20 DIAGNOSIS — E11621 Type 2 diabetes mellitus with foot ulcer: Secondary | ICD-10-CM

## 2021-01-20 DIAGNOSIS — Z89421 Acquired absence of other right toe(s): Secondary | ICD-10-CM

## 2021-01-24 NOTE — Progress Notes (Signed)
  Subjective:  Patient ID: UNNAMED [name], male    DOB: 1966-02-19,  MRN: [id number]  Chief Complaint  Patient presents with   Routine Post Op    follow up on toe amputation right     55 y.o. male returns with the above complaint. History confirmed with patient.  Overall doing well he is having some pain.  Has not changed the dressing since early.  He is WBAT in a surgical shoe right now Objective:  Physical Exam: warm, good capillary refill, no trophic changes or ulcerative lesions, and normal DP and PT pulses.  Full-thickness ulceration with exposed subcutaneous tissue submetatarsal 1  0.6 cm x 0.5 cm x 0.2 cm  ulceration with no exposed bone tendon or joint.  Fully granular wound bed.  Amputation site healing well sutures intact     1. 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 Status post amputation of lesser toe of right foot ([place])         Plan:  Patient was evaluated and treated and all questions answered.  Sutures removed today  Ulcer left submetatarsal 1 -We discussed the etiology and factors that are a part of the wound healing process.  We also discussed the risk of infection both soft tissue and osteomyelitis from open ulceration.  Discussed the risk of limb loss if this happens or worsens. -Debridement as below. -Dressed with Iodosorb, DSD. -Continue off-loading with surgical shoe.  Continue home daily dressing changes with mupirocin ointment which he already has  Procedure: Excisional Debridement of Wound Rationale: Removal of non-viable soft tissue from the wound to promote healing.  Anesthesia: none Post-Debridement Wound Measurements: 0.6 cm x 0.5 cm x 0.2 cm  Type of Debridement: Sharp Excisional Tissue Removed: Non-viable soft tissue Depth of Debridement: subcutaneous tissue. Technique: Sharp excisional debridement to bleeding, viable wound base.  Dressing: Dry, sterile, compression  dressing. Disposition: Patient tolerated procedure well. Patient to return in 1 week for follow-up.  No follow-ups on file.       No follow-ups on file.

## 2021-01-28 ENCOUNTER — Ambulatory Visit: Payer: Medicaid Other | Admitting: Podiatry

## 2021-02-22 ENCOUNTER — Other Ambulatory Visit: Payer: Self-pay

## 2021-02-22 ENCOUNTER — Ambulatory Visit (INDEPENDENT_AMBULATORY_CARE_PROVIDER_SITE_OTHER): Payer: Medicaid Other | Admitting: Podiatry

## 2021-02-22 DIAGNOSIS — L97411 Non-pressure chronic ulcer of right heel and midfoot limited to breakdown of skin: Secondary | ICD-10-CM

## 2021-02-22 DIAGNOSIS — E11621 Type 2 diabetes mellitus with foot ulcer: Secondary | ICD-10-CM

## 2021-02-22 NOTE — Progress Notes (Signed)
  Subjective:  Patient ID: [name], male    DOB: [birth year],  MRN: [id number]  Chief Complaint  Patient presents with   Diabetic Ulcer    Right foot - post op    55 y.o. male returns with the above complaint. History confirmed with patient.  Overall doing well feels like he may have healed at this point Objective:  Physical Exam: warm, good capillary refill, no trophic changes or ulcerative lesions, and normal DP and PT pulses.  Full-thickness ulceration with exposed subcutaneous tissue submetatarsal 1  0.6 cm x 0.5 cm x 0.2 cm  ulceration with no exposed bone tendon or joint.  Fully granular wound bed.  Amputation site healing well sutures intact     1. Diabetic ulcer of right midfoot associated with type 2 diabetes mellitus, limited to breakdown of skin ([place])         Plan:  Patient was evaluated and treated and all questions answered.  All wounds have healed finally.  Advised on possibility of recurrence.  Continue regular bathing and applying lotion and Vaseline daily.  He will return to see me as needed.  Ulceration and amputation sites are fully healed   Return if symptoms worsen or fail to improve.

## 2021-03-17 ENCOUNTER — Ambulatory Visit: Payer: Medicaid Other | Admitting: Podiatry

## 2021-05-20 ENCOUNTER — Other Ambulatory Visit: Payer: Self-pay

## 2021-05-20 ENCOUNTER — Ambulatory Visit: Payer: Medicaid Other | Admitting: Dermatology

## 2021-05-20 DIAGNOSIS — L609 Nail disorder, unspecified: Secondary | ICD-10-CM | POA: Diagnosis not present

## 2021-05-20 DIAGNOSIS — L659 Nonscarring hair loss, unspecified: Secondary | ICD-10-CM | POA: Diagnosis not present

## 2021-05-20 DIAGNOSIS — L918 Other hypertrophic disorders of the skin: Secondary | ICD-10-CM

## 2021-05-20 DIAGNOSIS — E1140 Type 2 diabetes mellitus with diabetic neuropathy, unspecified: Secondary | ICD-10-CM

## 2021-05-20 DIAGNOSIS — L720 Epidermal cyst: Secondary | ICD-10-CM

## 2021-05-20 MED ORDER — MOMETASONE FUROATE 0.1 % EX CREA: TOPICAL_CREAM | CUTANEOUS | 1 refills | Status: DC

## 2021-05-20 NOTE — Progress Notes (Signed)
New Patient Visit  Subjective  [name] is a 56 y.o. male who presents for the following: Skin Problem (Patient here today for a spot at back, present for about 1 year. Patient advises it gets bigger and when he squeezes it pus comes out. Patient also with skin tags under arms he would like removed. ). Patient would also like his face checked. No personal hx of skin cancer but patient's father does have hx of skin cancer.  The patient has spots, moles and lesions to be evaluated, some may be new or changing and the patient has concerns that these could be cancer.  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   The following portions of the chart were reviewed this encounter and updated as appropriate:   Tobacco  Allergies  Meds  Problems  Med Hx  Surg Hx  Fam Hx     Review of Systems:  No other skin or systemic complaints except as noted in HPI or Assessment and Plan.  Objective  Well appearing patient in no apparent distress; mood and affect are within normal limits.  A focused examination was performed including face, back, axilla. Relevant physical exam findings are noted in the Assessment and Plan.  right upper back paraspinal Subcutaneous nodule 2.2 cm  right great toe Callous formation and nail dystrophy  left chin Round, patchy areas of nonscarring hair loss.       Assessment & Plan  Epidermal inclusion cyst right upper back paraspinal Benign-appearing. Exam most consistent with an epidermal inclusion cyst. Discussed that a cyst is a benign growth that can grow over time and sometimes get irritated or inflamed. Recommend observation if it is not bothersome. Discussed option of surgical excision to remove it if it is growing, symptomatic, or other changes noted. Please call for new or changing lesions so they can be evaluated.  Nail problem right great toe Favor 2ndary to tight fitting shoes. Patient will follow up with podiatry. 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  Chronic and persistent condition with duration or expected duration over one year. Condition is symptomatic / bothersome to patient. Not to goal.  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  Start mometasone cream once daily Monday thru Friday to affected area. Avoid applying to face, groin, and axilla. Use as directed. Long-term use can cause thinning of the skin. #15 0RF  Topical steroids (such as triamcinolone, fluocinolone, fluocinonide, mometasone, clobetasol, halobetasol, betamethasone, hydrocortisone) can cause thinning and lightening of the skin if they are used for too long in the same area. Your physician has selected the right strength medicine for your problem and area affected on the body. Please use your medication only as directed by your physician to prevent side effects.    Alopecia areata is a chronic autoimmune condition localized to the skin which affects hair follicles and causes hair loss, most commonly in the scalp.  Cause is unknown.  Can be unpredictable, difficult to treat, and may recur.  Treatments may include topical and intralesional steroids to decrease inflammation to allow for hair regrowth.  Other treatments may include narrowband ultraviolet B light treatment; Squairic acid immunotherapy application; antihistamines and oral Jak inhibitors.  mometasone (ELOCON) 0.1 % cream - left chin Apply once daily to affected area Monday thru Friday  Acrochordons (Skin Tags) - Removal desired by patient The skin tag is symptomatic and irritating. - Fleshy, skin-colored pedunculated papule at right axilla - Benign  appearing but irritated.  - Patient desires removal. Reviewed that this may not be covered by insurance and they will be charged a cosmetic fee for removal. Patient signed non-covered consent.  - Prior to procedure, discussed risks of blister formation, small wound, skin dyspigmentation, or rare scar  following cryotherapy.  PROCEDURE - Cryotherapy was performed to affected areas. The procedure was tolerated well. Wound care was reviewed with the patient. They were advised to call with any concerns. Total number of treated acrochordons 1   Return for Surgery.  Anise Salvo, RMA, am acting as scribe for Armida Sans, MD . Documentation: I have reviewed the above documentation for accuracy and completeness, and I agree with the above.  Armida Sans, MD

## 2021-05-20 NOTE — Patient Instructions (Addendum)
Cryotherapy Aftercare ? ?Wash gently with soap and water everyday.   ?Apply Vaseline and Band-Aid daily until healed.  ? ? ?Start mometasone cream once daily Monday thru Friday to affected area at left lower face. Avoid applying to face, groin, and axilla. Use as directed. Long-term use can cause thinning of the skin. #15 0RF ? ?Topical steroids (such as triamcinolone, fluocinolone, fluocinonide, mometasone, clobetasol, halobetasol, betamethasone, hydrocortisone) can cause thinning and lightening of the skin if they are used for too long in the same area. Your physician has selected the right strength medicine for your problem and area affected on the body. Please use your medication only as directed by your physician to prevent side effects.  ? ?If You Need Anything After Your Visit ? ?If you have any questions or concerns for your doctor, please call our main line at [phone] and press option 4 to reach your doctor's medical assistant. If no one answers, please leave a voicemail as directed and we will return your call as soon as possible. Messages left after 4 pm will be answered the following business day.  ? ?You may also send [place] a message via MyChart. We typically respond to MyChart messages within 1-2 business days. ? ?For prescription refills, please ask your pharmacy to contact our office. Our fax number is [phone]. ? ?If you have an urgent issue when the clinic is closed that cannot wait until the next business day, you can page your doctor at the number below.   ? ?Please note that while we do our best to be available for urgent issues outside of office hours, we are not available 24/7.  ? ?If you have an urgent issue and are unable to reach [place], you may choose to seek medical care at your doctor's office, retail clinic, urgent care center, or emergency room. ? ?If you have a medical emergency, please immediately call 911 or go to the emergency department. ? ?Pager Numbers ? ?- Dr. Nehemiah Massed:  [phone] ? ?- Dr. Laurence Ferrari: [phone] ? ?- Dr. Nicole Kindred: [phone] ? ?In the event of inclement weather, please call our main line at [phone] for an update on the status of any delays or closures. ? ?Dermatology Medication Tips: ?Please keep the boxes that topical medications come in in order to help keep track of the instructions about where and how to use these. Pharmacies typically print the medication instructions only on the boxes and not directly on the medication tubes.  ? ?If your medication is too expensive, please contact our office at [phone] option 4 or send [place] a message through [place].  ? ?We are unable to tell what your co-pay for medications will be in advance as this is different depending on your insurance coverage. However, we may be able to find a substitute medication at lower cost or fill out paperwork to get insurance to cover a needed medication.  ? ?If a prior authorization is required to get your medication covered by your insurance company, please allow [place] 1-2 business days to complete this process. ? ?Drug prices often vary depending on where the prescription is filled and some pharmacies may offer cheaper prices. ? ?The website www.goodrx.com contains coupons for medications through different pharmacies. The prices here do not account for what the cost may be with help from insurance (it may be cheaper with your insurance), but the website can give you the price if you did not use any insurance.  ?- You can print the associated coupon and take  it with your prescription to the pharmacy.  ?- You may also stop by our office during regular business hours and pick up a GoodRx coupon card.  ?- If you need your prescription sent electronically to a different pharmacy, notify our office through [facility] or by phone at [phone] option 4. ? ? ? ? ?Si Usted Necesita Algo Despu?s de Su Visita ? ?Tambi?n puede enviarnos un mensaje a trav?s de MyChart. Por lo general  respondemos a los mensajes de MyChart en el transcurso de 1 a 2 d?as h?biles. ? ?Para renovar recetas, por favor pida a su farmacia que se ponga en contacto con nuestra oficina. Nuestro n?mero de fax es el [phone]. ? ?Si tiene un asunto urgente cuando la cl?nica est? cerrada y que no puede esperar hasta el siguiente d?a h?bil, puede llamar/localizar a su doctor(a) al n?mero que aparece a continuaci?n.  ? ?Por favor, tenga en cuenta que aunque hacemos todo lo posible para estar disponibles para asuntos urgentes fuera del horario de oficina, no estamos disponibles las 24 horas del d?a, los 7 d?as de la semana.  ? ?Si tiene un problema urgente y no puede comunicarse con nosotros, puede optar por buscar atenci?n m?dica  en el consultorio de su doctor(a), en una cl?nica privada, en un centro de atenci?n urgente o en una sala de emergencias. ? ?Si tiene Engineer, maintenance (IT) m?dica, por favor llame inmediatamente al 911 o vaya a la sala de emergencias. ? ?N?meros de b?per ? ?- Dr. Nehemiah Massed: [phone] ? ?- Dra. Moye: [phone] ? ?- Dra. Nicole Kindred: [phone] ? ?En caso de inclemencias del tiempo, por favor llame a nuestra l?nea principal al [phone] para una actualizaci?n sobre el estado de cualquier retraso o cierre. ? ?Consejos para la medicaci?n en dermatolog?a: ?Por favor, guarde las cajas en las que vienen los medicamentos de uso t?pico para ayudarle a seguir las instrucciones sobre d?nde y c?mo usarlos. Las farmacias generalmente imprimen las instrucciones del medicamento s?lo en las cajas y no directamente en los tubos del [place].  ? ?Si su medicamento es muy caro, por favor, p?ngase en contacto con Zigmund Daniel llamando al [phone] y presione la opci?n 4 o env?enos un mensaje a trav?s de MyChart.  ? ?No podemos decirle cu?l ser? su copago por los medicamentos por adelantado ya que esto es diferente dependiendo de la cobertura de su seguro. Sin embargo, es posible que podamos encontrar un  medicamento sustituto a Electrical engineer un formulario para que el seguro cubra el medicamento que se considera necesario.  ? ?Si se requiere Ardelia Mems autorizaci?n previa para que su compa??a de seguros [place] su medicamento, por favor perm?tanos de 1 a 2 d?as h?biles para completar este proceso. ? ?Los precios de los medicamentos var?an con frecuencia dependiendo del Environmental consultant de d?nde se surte la receta y alguna farmacias pueden ofrecer precios m?s baratos. ? ?El sitio web www.goodrx.com tiene cupones para medicamentos de Airline pilot. Los precios aqu? no tienen en cuenta lo que podr?a costar con la ayuda del seguro (puede ser m?s barato con su seguro), pero el sitio web puede darle el precio si no utiliz? ning?n seguro.  ?- Puede imprimir el cup?n correspondiente y llevarlo con su receta a la farmacia.  ?- Tambi?n puede pasar por nuestra oficina durante el horario de atenci?n regular y recoger una tarjeta de cupones de GoodRx.  ?- Si necesita que su receta se env?e electr?nicamente a Chiropodist, informe a nuestra oficina a trav?s de MyChart de  [place] o por tel?fono llamando al [phone] y presione la opci?n 4. ? ?

## 2021-05-26 ENCOUNTER — Encounter: Payer: Self-pay | Admitting: Dermatology

## 2021-07-20 ENCOUNTER — Ambulatory Visit: Payer: Medicaid Other | Admitting: Dermatology

## 2021-07-20 DIAGNOSIS — D492 Neoplasm of unspecified behavior of bone, soft tissue, and skin: Secondary | ICD-10-CM

## 2021-07-20 DIAGNOSIS — D229 Melanocytic nevi, unspecified: Secondary | ICD-10-CM

## 2021-07-20 DIAGNOSIS — L720 Epidermal cyst: Secondary | ICD-10-CM | POA: Diagnosis not present

## 2021-07-20 DIAGNOSIS — D225 Melanocytic nevi of trunk: Secondary | ICD-10-CM

## 2021-07-20 MED ORDER — MUPIROCIN 2 % EX OINT: 1.0000 "application " | TOPICAL_OINTMENT | Freq: Every day | CUTANEOUS | 1 refills | Status: AC

## 2021-07-20 NOTE — Patient Instructions (Signed)
Wound Care Instructions ? ?On the day following your surgery, you should begin doing daily dressing changes: ?Remove the old dressing and discard it. ?Cleanse the wound gently with tap water. This may be done in the shower or by placing a wet gauze pad directly on the wound and letting it soak for several minutes. ?It is important to gently remove any dried blood from the wound in order to encourage healing. This may be done by gently rolling a moistened Q-tip on the dried blood. Do not pick at the wound. ?If the wound should start to bleed, continue cleaning the wound, then place a moist gauze pad on the wound and hold pressure for a few minutes.  ?Make sure you then dry the skin surrounding the wound completely or the tape will not stick to the skin. Do not use cotton balls on the wound. ?After the wound is clean and dry, apply the ointment gently with a Q-tip. ?Cut a non-stick pad to fit the size of the wound. Lay the pad flush to the wound. If the wound is draining, you may want to reinforce it with a small amount of gauze on top of the non-stick pad for a little added compression to the area. ?Use the tape to seal the area completely. ?Select from the following with respect to your individual situation: ?If your wound has been stitched closed: continue the above steps 1-8 at least daily until your sutures are removed. ?If your wound has been left open to heal: continue steps 1-8 at least daily for the first 3-4 weeks. ?We would like for you to take a few extra precautions for at least the next week. ?Sleep with your head elevated on pillows if our wound is on your head. ?Do not bend over or lift heavy items to reduce the chance of elevated blood pressure to the wound ?Do not participate in particularly strenuous activities. ? ? ?Below is a list of dressing supplies you might need.  ?Cotton-tipped applicators - Q-tips ?Gauze pads (2x2 and/or 4x4) - All-Purpose Sponges ?Non-stick dressing material - Telfa ?Tape -  Paper or Hypafix ?New and clean tube of petroleum jelly - Vaseline  ? ? ?Comments on Post-Operative Period ?Slight swelling and redness often appear around the wound. This is normal and will disappear within several days following the surgery. ?The healing wound will drain a brownish-red-yellow discharge during healing. This is a normal phase of wound healing. As the wound begins to heal, the drainage may increase in amount. Again, this drainage is normal. ?Notify us if the drainage becomes persistently bloody, excessively swollen, or intensely painful or develops a foul odor or red streaks.  ?If you should experience mild discomfort during the healing phase, you may take an aspirin-free medication such as Tylenol (acetaminophen). Notify us if the discomfort is severe or persistent. Avoid alcoholic beverages when taking pain medicine. ? ?In Case of Wound Hemorrhage ?A wound hemorrhage is when the bandage suddenly becomes soaked with bright red blood and flows profusely. If this happens, sit down or lie down with your head elevated. If the wound has a dressing on it, do not remove the dressing. Apply pressure to the existing gauze. If the wound is not covered, use a gauze pad to apply pressure and continue applying the pressure for 20 minutes without peeking. DO NOT COVER THE WOUND WITH A LARGE TOWEL OR WASH CLOTH. Release your hand from the wound site but do not remove the dressing. If the bleeding has stopped,   gently clean around the wound. Leave the dressing in place for 24 hours if possible. This wait time allows the blood vessels to close off so that you do not spark a new round of bleeding by disrupting the newly clotted blood vessels with an immediate dressing change. If the bleeding does not subside, continue to hold pressure. If matters are out of your control, contact an After Hours clinic or go to the Emergency Room. ? ? ? ?If You Need Anything After Your Visit ? ?If you have any questions or concerns for  your doctor, please call our main line at [phone] and press option 4 to reach your doctor's medical assistant. If no one answers, please leave a voicemail as directed and we will return your call as soon as possible. Messages left after 4 pm will be answered the following business day.  ? ?You may also send [place] a message via MyChart. We typically respond to MyChart messages within 1-2 business days. ? ?For prescription refills, please ask your pharmacy to contact our office. Our fax number is [phone]. ? ?If you have an urgent issue when the clinic is closed that cannot wait until the next business day, you can page your doctor at the number below.   ? ?Please note that while we do our best to be available for urgent issues outside of office hours, we are not available 24/7.  ? ?If you have an urgent issue and are unable to reach [place], you may choose to seek medical care at your doctor's office, retail clinic, urgent care center, or emergency room. ? ?If you have a medical emergency, please immediately call 911 or go to the emergency department. ? ?Pager Numbers ? ?- Dr. Nehemiah Massed: [phone] ? ?- Dr. Laurence Ferrari: [phone] ? ?- Dr. Nicole Kindred: [phone] ? ?In the event of inclement weather, please call our main line at [phone] for an update on the status of any delays or closures. ? ?Dermatology Medication Tips: ?Please keep the boxes that topical medications come in in order to help keep track of the instructions about where and how to use these. Pharmacies typically print the medication instructions only on the boxes and not directly on the medication tubes.  ? ?If your medication is too expensive, please contact our office at [phone] option 4 or send [place] a message through [facility].  ? ?We are unable to tell what your co-pay for medications will be in advance as this is different depending on your insurance coverage. However, we may be able to find a substitute medication at lower cost or fill out  paperwork to get insurance to cover a needed medication.  ? ?If a prior authorization is required to get your medication covered by your insurance company, please allow [place] 1-2 business days to complete this process. ? ?Drug prices often vary depending on where the prescription is filled and some pharmacies may offer cheaper prices. ? ?The website www.goodrx.com contains coupons for medications through different pharmacies. The prices here do not account for what the cost may be with help from insurance (it may be cheaper with your insurance), but the website can give you the price if you did not use any insurance.  ?- You can print the associated coupon and take it with your prescription to the pharmacy.  ?- You may also stop by our office during regular business hours and pick up a GoodRx coupon card.  ?- If you need your prescription sent electronically to a different pharmacy, notify our  office through [facility] or by phone at [phone] option 4. ? ? ? ? ?Si Usted Necesita Algo Despu?s de Su Visita ? ?Tambi?n puede enviarnos un mensaje a trav?s de MyChart. Por lo general respondemos a los mensajes de MyChart en el transcurso de 1 a 2 d?as h?biles. ? ?Para renovar recetas, por favor pida a su farmacia que se ponga en contacto con nuestra oficina. Nuestro n?mero de fax es el [phone]. ? ?Si tiene un asunto urgente cuando la cl?nica est? cerrada y que no puede esperar hasta el siguiente d?a h?bil, puede llamar/localizar a su doctor(a) al n?mero que aparece a continuaci?n.  ? ?Por favor, tenga en cuenta que aunque hacemos todo lo posible para estar disponibles para asuntos urgentes fuera del horario de oficina, no estamos disponibles las 24 horas del d?a, los 7 d?as de la semana.  ? ?Si tiene un problema urgente y no puede comunicarse con nosotros, puede optar por buscar atenci?n m?dica  en el consultorio de su doctor(a), en una cl?nica privada, en un centro de atenci?n urgente o en una sala de  emergencias. ? ?Si tiene Engineer, maintenance (IT) m?dica, por favor llame inmediatamente al 911 o vaya a la sala de emergencias. ? ?N?meros de b?per ? ?- Dr. Nehemiah Massed: [phone] ? ?- Dra. Moye: [phone] ? ?- Dra

## 2021-07-20 NOTE — Progress Notes (Signed)
   Follow-Up Visit   Subjective  [name] is a 56 y.o. male who presents for the following: Cyst (R upper back paraspinal, pt presents for excision).  The following portions of the chart were reviewed this encounter and updated as appropriate:   Tobacco  Allergies  Meds  Problems  Med Hx  Surg Hx  Fam Hx     Review of Systems:  No other skin or systemic complaints except as noted in HPI or Assessment and Plan.  Objective  Well appearing patient in no apparent distress; mood and affect are within normal limits.  A focused examination was performed including back. Relevant physical exam findings are noted in the Assessment and Plan.  Right Upper Back Paraspinal Cystic pap  Trunk Brown macules   Assessment & Plan  Neoplasm of skin Right Upper Back Paraspinal  mupirocin ointment (BACTROBAN) 2 % Apply 1 application. topically daily. Qd to excision site  Skin excision  Lesion length (cm):  3.2 Lesion width (cm):  3.2 Margin per side (cm):  0 Total excision diameter (cm):  3.2 Informed consent: discussed and consent obtained   Timeout: patient name, date of birth, surgical site, and procedure verified   Procedure prep:  Patient was prepped and draped in usual sterile fashion Prep type:  Isopropyl alcohol and povidone-iodine Anesthesia: the lesion was anesthetized in a standard fashion   Anesthetic:  1% lidocaine w/ epinephrine 1-100,000 buffered w/ 8.4% NaHCO3 Instrument used: #15 blade   Hemostasis achieved with: pressure   Hemostasis achieved with comment:  Electrocautery Outcome: patient tolerated procedure well with no complications   Post-procedure details: sterile dressing applied and wound care instructions given   Dressing type: bandage and pressure dressing (Mupirocin)    Skin repair Complexity:  Complex Final length (cm):  4 Reason for type of repair: reduce tension to allow closure, reduce the risk of dehiscence, infection, and necrosis, reduce  subcutaneous dead space and avoid a hematoma, allow closure of the large defect, preserve normal anatomy, preserve normal anatomical and functional relationships and enhance both functionality and cosmetic results   Undermining: area extensively undermined   Undermining comment:  Undermining Defect 3.2 cm Subcutaneous layers (deep stitches):  Suture size:  2-0 Suture type: Vicryl (polyglactin 910)   Subcutaneous suture technique: Inverted Dermal. Fine/surface layer approximation (top stitches):  Suture size:  2-0 Suture type: nylon   Stitches: simple running   Suture removal (days):  7 Hemostasis achieved with: suture and pressure Outcome: patient tolerated procedure well with no complications   Post-procedure details: sterile dressing applied and wound care instructions given   Dressing type: bandage, pressure dressing and bacitracin (Mupirocin)    Specimen 1 - Surgical pathology Differential Diagnosis: D48.5 Cyst vs other  Check Margins: No Cystic pap  Cyst vs other excised today Start Mupirocin oint qd to excision site  Nevus Trunk Recheck on follow up  Return in about 1 week (around 07/27/2021) for suture removal.  I, Ashok Cordia, CMA, am acting as scribe for Sarina Ser, MD . Documentation: I have reviewed the above documentation for accuracy and completeness, and I agree with the above.  Sarina Ser, MD

## 2021-07-22 ENCOUNTER — Telehealth: Payer: Self-pay

## 2021-07-22 NOTE — Telephone Encounter (Signed)
LMOVM please return my call ?

## 2021-07-22 NOTE — Telephone Encounter (Signed)
-----   Message from Ralene Bathe, MD sent at 07/21/2021  5:12 PM EDT ----- ?Diagnosis ?Skin (M), right upper back paraspinal ?EPIDERMAL INCLUSION CYST ? ?Benign cyst ?

## 2021-07-27 ENCOUNTER — Ambulatory Visit (INDEPENDENT_AMBULATORY_CARE_PROVIDER_SITE_OTHER): Payer: Medicaid Other | Admitting: Dermatology

## 2021-07-27 DIAGNOSIS — L821 Other seborrheic keratosis: Secondary | ICD-10-CM

## 2021-07-27 DIAGNOSIS — D0359 Melanoma in situ of other part of trunk: Secondary | ICD-10-CM

## 2021-07-27 DIAGNOSIS — L814 Other melanin hyperpigmentation: Secondary | ICD-10-CM | POA: Diagnosis not present

## 2021-07-27 DIAGNOSIS — Z1283 Encounter for screening for malignant neoplasm of skin: Secondary | ICD-10-CM

## 2021-07-27 DIAGNOSIS — D1800 Hemangioma unspecified site: Secondary | ICD-10-CM

## 2021-07-27 DIAGNOSIS — L720 Epidermal cyst: Secondary | ICD-10-CM

## 2021-07-27 DIAGNOSIS — D239 Other benign neoplasm of skin, unspecified: Secondary | ICD-10-CM

## 2021-07-27 DIAGNOSIS — L578 Other skin changes due to chronic exposure to nonionizing radiation: Secondary | ICD-10-CM | POA: Diagnosis not present

## 2021-07-27 DIAGNOSIS — D225 Melanocytic nevi of trunk: Secondary | ICD-10-CM | POA: Diagnosis not present

## 2021-07-27 DIAGNOSIS — D039 Melanoma in situ, unspecified: Secondary | ICD-10-CM

## 2021-07-27 DIAGNOSIS — D485 Neoplasm of uncertain behavior of skin: Secondary | ICD-10-CM

## 2021-07-27 DIAGNOSIS — D229 Melanocytic nevi, unspecified: Secondary | ICD-10-CM

## 2021-07-27 DIAGNOSIS — Z4802 Encounter for removal of sutures: Secondary | ICD-10-CM

## 2021-07-27 HISTORY — DX: Other benign neoplasm of skin, unspecified: D23.9

## 2021-07-27 HISTORY — DX: Melanoma in situ, unspecified: D03.9

## 2021-07-27 NOTE — Patient Instructions (Addendum)

## 2021-07-27 NOTE — Progress Notes (Signed)
Follow-Up Visit   Subjective  [name] is a 56 y.o. male who presents for the following: Suture removal  (Pathology proven benign cyst of the R upper back paraspinal - pt is here for suture removal ) and UBSE  (Mole check today ). The patient presents for Upper Body Skin Exam (UBSE) for skin cancer screening and mole check.  The patient has spots, moles and lesions to be evaluated, some may be new or changing and the patient has concerns that these could be cancer.  The following portions of the chart were reviewed this encounter and updated as appropriate:   Tobacco  Allergies  Meds  Problems  Med Hx  Surg Hx  Fam Hx     Review of Systems:  No other skin or systemic complaints except as noted in HPI or Assessment and Plan.  Objective  Well appearing patient in no apparent distress; mood and affect are within normal limits.  All skin waist up examined.  R mid back 6.0 cm lat to spine 1.2 cm irregular brown macule.  L epigastric 0.7 cm irregular brown macule.  R upper back paraspinal Healing excision site.   Assessment & Plan  Neoplasm of uncertain behavior of skin (2) R mid back 6.0 cm lat to spine Epidermal / dermal shaving  Lesion diameter (cm):  1.2 Informed consent: discussed and consent obtained   Timeout: patient name, date of birth, surgical site, and procedure verified   Procedure prep:  Patient was prepped and draped in usual sterile fashion Prep type:  Isopropyl alcohol Anesthesia: the lesion was anesthetized in a standard fashion   Anesthetic:  1% lidocaine w/ epinephrine 1-100,000 buffered w/ 8.4% NaHCO3 Instrument used: flexible razor blade   Hemostasis achieved with: pressure, aluminum chloride and electrodesiccation   Outcome: patient tolerated procedure well   Post-procedure details: sterile dressing applied and wound care instructions given   Dressing type: bandage and petrolatum    Specimen 1 - Surgical pathology Differential Diagnosis:  D48.5 r/o dysplastic nevus Check Margins: No  L epigastric Epidermal / dermal shaving  Lesion diameter (cm):  0.7 Informed consent: discussed and consent obtained   Timeout: patient name, date of birth, surgical site, and procedure verified   Procedure prep:  Patient was prepped and draped in usual sterile fashion Prep type:  Isopropyl alcohol Anesthesia: the lesion was anesthetized in a standard fashion   Anesthetic:  1% lidocaine w/ epinephrine 1-100,000 buffered w/ 8.4% NaHCO3 Instrument used: flexible razor blade   Hemostasis achieved with: pressure, aluminum chloride and electrodesiccation   Outcome: patient tolerated procedure well   Post-procedure details: sterile dressing applied and wound care instructions given   Dressing type: bandage and petrolatum    Specimen 2 - Surgical pathology Differential Diagnosis: D48.5 r/o dysplastic nevus  Check Margins: No  Epidermal inclusion cyst R upper back paraspinal  Encounter for Removal of Sutures - Incision site at the R upper back paraspinal is clean, dry and intact - Wound cleansed, sutures removed, wound cleansed and steri strips applied.  - Discussed pathology results showing a benign cyst.  - Patient advised to keep steri-strips dry until they fall off. - Scars remodel for a full year. - Once steri-strips fall off, patient can apply over-the-counter silicone scar cream each night to help with scar remodeling if desired. - Patient advised to call with any concerns or if they notice any new or changing lesions.  Skin cancer screening  Lentigines - Scattered tan macules - Due to  sun exposure - Benign-appearing, observe - Recommend daily broad spectrum sunscreen SPF 30+ to sun-exposed areas, reapply every 2 hours as needed. - Call for any changes  Seborrheic Keratoses - Stuck-on, waxy, tan-brown papules and/or plaques  - Benign-appearing - Discussed benign etiology and prognosis. - Observe - Call for any  changes  Melanocytic Nevi - Tan-brown and/or pink-flesh-colored symmetric macules and papules - Benign appearing on exam today - Observation - Call clinic for new or changing moles - Recommend daily use of broad spectrum spf 30+ sunscreen to sun-exposed areas.   Hemangiomas - Red papules - Discussed benign nature - Observe - Call for any changes  Actinic Damage - Chronic condition, secondary to cumulative UV/sun exposure - diffuse scaly erythematous macules with underlying dyspigmentation - Recommend daily broad spectrum sunscreen SPF 30+ to sun-exposed areas, reapply every 2 hours as needed.  - Staying in the shade or wearing long sleeves, sun glasses (UVA+UVB protection) and wide brim hats (4-inch brim around the entire circumference of the hat) are also recommended for sun protection.  - Call for new or changing lesions.  Skin cancer screening performed today.  Return in about 1 year (around 07/28/2022) for TBSE.  Luther Redo, CMA, am acting as scribe for Sarina Ser, MD . Documentation: I have reviewed the above documentation for accuracy and completeness, and I agree with the above.  Sarina Ser, MD

## 2021-08-02 ENCOUNTER — Telehealth: Payer: Self-pay

## 2021-08-02 NOTE — Telephone Encounter (Signed)
Phone line busy.

## 2021-08-02 NOTE — Telephone Encounter (Signed)
-----   Message from Ralene Bathe, MD sent at 07/29/2021  5:50 PM EDT ----- Diagnosis 1. 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  No other phone # listed. Please call pt and advise and schedule surgery and will review at that time. I will talk to pt when he is called if I am available OR I can call pt back after he is contacted by MAs

## 2021-08-03 ENCOUNTER — Encounter: Payer: Self-pay | Admitting: Dermatology

## 2021-08-03 ENCOUNTER — Telehealth: Payer: Self-pay

## 2021-08-03 NOTE — Telephone Encounter (Signed)
-----   Message from Ralene Bathe, MD sent at 07/29/2021  5:50 PM EDT ----- Diagnosis 1. 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  No other phone # listed. Please call pt and advise and schedule surgery and will review at that time. I will talk to pt when he is called if I am available OR I can call pt back after he is contacted by MAs

## 2021-08-03 NOTE — Telephone Encounter (Signed)
No answer and no voicemail set up. Letter sent.  

## 2021-08-05 ENCOUNTER — Telehealth: Payer: Self-pay

## 2021-08-05 NOTE — Telephone Encounter (Signed)
08/05/2021 - Dr Nehemiah Massed spoke with patient regarding diagnosis and answered his questions. Patient is on Dr Stewart's surgery schedule 08/11/2021 at 3:30/hd

## 2021-08-05 NOTE — Telephone Encounter (Signed)
-----   Message from Ralene Bathe, MD sent at 07/29/2021  5:50 PM EDT ----- Diagnosis 1. 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  No other phone # listed. Please call pt and advise and schedule surgery and will review at that time. I will talk to pt when he is called if I am available OR I can call pt back after he is contacted by MAs

## 2021-08-07 ENCOUNTER — Encounter: Payer: Self-pay | Admitting: Dermatology

## 2021-08-11 ENCOUNTER — Encounter: Payer: Self-pay | Admitting: Dermatology

## 2021-08-11 ENCOUNTER — Ambulatory Visit (INDEPENDENT_AMBULATORY_CARE_PROVIDER_SITE_OTHER): Payer: Medicaid Other | Admitting: Dermatology

## 2021-08-11 DIAGNOSIS — D0359 Melanoma in situ of other part of trunk: Secondary | ICD-10-CM

## 2021-08-11 DIAGNOSIS — L280 Lichen simplex chronicus: Secondary | ICD-10-CM

## 2021-08-11 MED ORDER — CLOBETASOL PROPIONATE 0.05 % EX CREA: 1.0000 "application " | TOPICAL_CREAM | Freq: Two times a day (BID) | CUTANEOUS | 0 refills | Status: DC

## 2021-08-11 NOTE — Progress Notes (Signed)
Follow-Up Visit   Subjective  [name] is a 56 y.o. male who presents for the following: Procedure (Patient here today for excision of bx proven melanoma in situ at right mid back 6.0 cm lat to spine.).   The following portions of the chart were reviewed this encounter and updated as appropriate:       Review of Systems:  No other skin or systemic complaints except as noted in HPI or Assessment and Plan.  Objective  Well appearing patient in no apparent distress; mood and affect are within normal limits.  A focused examination was performed including back. Relevant physical exam findings are noted in the Assessment and Plan.  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 Avoid applying to face, groin, and axilla. Use as directed. Long-term use can cause thinning of the skin.  Topical steroids (such as triamcinolone, fluocinolone, fluocinonide, mometasone, clobetasol, halobetasol, betamethasone, hydrocortisone) can cause thinning and lightening of the skin if they are used for too long in the same area. Your physician has selected the right strength medicine for your problem and area affected on the body. Please use your medication only as directed by your physician to prevent side effects.    clobetasol cream (TEMOVATE) 0.05 % - Left Forearm - Posterior Apply 1 application. topically 2 (two) times daily. 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 6.0 cm lat to spine  Skin  excision  Lesion length (cm):  1.2 Lesion width (cm):  1.2 Margin per side (cm):  0.5 Total excision diameter (cm):  2.2 Informed consent: discussed and consent obtained   Timeout: patient name, date of birth, surgical site, and procedure verified   Procedure prep:  Patient was prepped and draped in usual sterile fashion Prep type:  Povidone-iodine Anesthesia: the lesion was anesthetized in a standard fashion   Anesthetic:  1% lidocaine w/ epinephrine 1-100,000 buffered w/ 8.4% NaHCO3 (9cc lido w/epi, 9cc bupivicaine) Instrument used: #15 blade   Hemostasis achieved with: suture, pressure and electrodesiccation   Outcome: patient tolerated procedure well with no complications   Additional details:  Tag at medial superior 12:00  Skin repair Complexity:  Complex Final length (cm):  5 Informed consent: discussed and consent obtained   Timeout: patient name, date of birth, surgical site, and procedure verified   Reason for type of repair: reduce tension to allow closure, reduce the risk of dehiscence, infection, and necrosis, reduce subcutaneous dead space and avoid a hematoma, preserve normal anatomical and functional relationships and enhance both functionality and cosmetic results   Undermining: area extensively undermined   Undermining comment:  2.2 cm Subcutaneous layers (deep stitches):  Suture size:  3-0 Suture type: Vicryl (polyglactin 910)   Subcutaneous suture technique: inverted dermal. Fine/surface layer approximation (top stitches):  Suture size:  3-0 Suture type: nylon   Stitches: vertical mattress and simple interrupted   Suture removal (days):  7 Hemostasis achieved with: suture Outcome: patient tolerated procedure  well with no complications   Post-procedure details: sterile dressing applied and wound care instructions given   Dressing type: pressure dressing (Mupirocin ointment)    Specimen 1 - Surgical pathology Differential Diagnosis: Bx proven MELANOMA IN SITU  ARISING IN A DYSPLASTIC NEVUS  Check Margins: yes Healing bx site [phone] Superior medial to biopsy site is a 0.6 cm brown macule included in specimen margin Tag at medial superior 12:00   Bx proven 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  Graciella Belton, RMA, am acting as scribe for Brendolyn Patty, MD .  Documentation: I have reviewed the above documentation for accuracy and completeness, and I agree with the above.  Brendolyn Patty MD

## 2021-08-11 NOTE — Patient Instructions (Addendum)
Wound Care Instructions for After Surgery  On the day following your surgery, you should begin doing daily dressing changes until your sutures are removed: Remove the bandage. Cleanse the wound gently with soap and water.  Make sure you then dry the skin surrounding the wound completely or the tape will not stick to the skin. Do not use cotton balls on the wound. After the wound is clean and dry, apply the ointment (either prescription antibiotic prescribed by your doctor or plain Vaseline if nothing was prescribed) gently with a Q-tip. If you are using a bandaid to cover: Apply a bandaid large enough to cover the entire wound. If you do not have a bandaid large enough to cover the wound OR if you are sensitive to bandaid adhesive: Cut a non-stick pad (such as Telfa) to fit the size of the wound.  Cover the wound with the non-stick pad. If the wound is draining, you may want to add a small amount of gauze on top of the non-stick pad for a little added compression to the area. Use tape to seal the area completely.  For the next 1-2 weeks: Be sure to keep the wound moist with ointment 24/7 to ensure best healing. If you are unable to cover the wound with a bandage to hold the ointment in place, you may need to reapply the ointment several times a day. Do not bend over or lift heavy items to reduce the chance of elevated blood pressure to the wound. Do not participate in particularly strenuous activities.  Below is a list of dressing supplies you might need.  Cotton-tipped applicators - Q-tips Gauze pads (2x2 and/or 4x4) - All-Purpose Sponges New and clean tube of petroleum jelly (Vaseline) OR prescription antibiotic ointment if prescribed Either a bandaid large enough to cover the entire wound OR non-stick dressing material (Telfa) and Tape (Paper or Hypafix)  FOR ADULT SURGERY PATIENTS: If you need something for pain relief, you may take 1 extra strength Tylenol (acetaminophen) and 2  ibuprofen (200 mg) together every 4 hours as needed. (Do not take these medications if you are allergic to them or if you know you cannot take them for any other reason). Typically you may only need pain medication for 1-3 days.   Comments on the Post-Operative Period Slight swelling and redness often appear around the wound. This is normal and will disappear within several days following the surgery. The healing wound will drain a brownish-red-yellow discharge during healing. This is a normal phase of wound healing. As the wound begins to heal, the drainage may increase in amount. Again, this drainage is normal. Notify us if the drainage becomes persistently bloody, excessively swollen, or intensely painful or develops a foul odor or red streaks.  The healing wound will also typically be itchy. This is normal. If you have severe or persistent pain, Notify us if the discomfort is severe or persistent. Avoid alcoholic beverages when taking pain medicine.  In Case of Wound Hemorrhage A wound hemorrhage is when the bandage suddenly becomes soaked with bright red blood and flows profusely. If this happens, sit down or lie down with your head elevated. If the wound has a dressing on it, do not remove the dressing. Apply pressure to the existing gauze. If the wound is not covered, use a gauze pad to apply pressure and continue applying the pressure for 20 minutes without peeking. DO NOT COVER THE WOUND WITH A LARGE TOWEL OR WASH CLOTH. Release your hand from the   wound site but do not remove the dressing. If the bleeding has stopped, gently clean around the wound. Leave the dressing in place for 24 hours if possible. This wait time allows the blood vessels to close off so that you do not spark a new round of bleeding by disrupting the newly clotted blood vessels with an immediate dressing change. If the bleeding does not subside, continue to hold pressure for 40 minutes. If bleeding continues, page your  physician, contact an After Hours clinic or go to the Emergency Room.   Start clobetasol 0.05% cream 1-2 times daily for up to 2 weeks as needed for itch at forearm. Avoid applying to face, groin, and axilla. Use as directed. Long-term use can cause thinning of the skin.  Topical steroids (such as triamcinolone, fluocinolone, fluocinonide, mometasone, clobetasol, halobetasol, betamethasone, hydrocortisone) can cause thinning and lightening of the skin if they are used for too long in the same area. Your physician has selected the right strength medicine for your problem and area affected on the body. Please use your medication only as directed by your physician to prevent side effects.    If You Need Anything After Your Visit  If you have any questions or concerns for your doctor, please call our main line at [phone] and press option 4 to reach your doctor's medical assistant. If no one answers, please leave a voicemail as directed and we will return your call as soon as possible. Messages left after 4 pm will be answered the following business day.   You may also send [place] a message via [place]. We typically respond to MyChart messages within 1-2 business days.  For prescription refills, please ask your pharmacy to contact our office. Our fax number is [phone].  If you have an urgent issue when the clinic is closed that cannot wait until the next business day, you can page your doctor at the number below.    Please note that while we do our best to be available for urgent issues outside of office hours, we are not available 24/7.   If you have an urgent issue and are unable to reach [place], you may choose to seek medical care at your doctor's office, retail clinic, urgent care center, or emergency room.  If you have a medical emergency, please immediately call 911 or go to the emergency department.  Pager Numbers  - Dr. Nehemiah Massed: [phone]  - Dr. Laurence Ferrari: [phone]  - Dr.  Nicole Kindred: [phone]  In the event of inclement weather, please call our main line at [phone] for an update on the status of any delays or closures.  Dermatology Medication Tips: Please keep the boxes that topical medications come in in order to help keep track of the instructions about where and how to use these. Pharmacies typically print the medication instructions only on the boxes and not directly on the medication tubes.   If your medication is too expensive, please contact our office at [phone] option 4 or send [place] a message through [place].   We are unable to tell what your co-pay for medications will be in advance as this is different depending on your insurance coverage. However, we may be able to find a substitute medication at lower cost or fill out paperwork to get insurance to cover a needed medication.   If a prior authorization is required to get your medication covered by your insurance company, please allow [place] 1-2 business days to complete this process.  Drug prices often  vary depending on where the prescription is filled and some pharmacies may offer cheaper prices.  The website www.goodrx.com contains coupons for medications through different pharmacies. The prices here do not account for what the cost may be with help from insurance (it may be cheaper with your insurance), but the website can give you the price if you did not use any insurance.  - You can print the associated coupon and take it with your prescription to the pharmacy.  - You may also stop by our office during regular business hours and pick up a GoodRx coupon card.  - If you need your prescription sent electronically to a different pharmacy, notify our office through [facility] or by phone at [phone] option 4.     Si Usted Necesita Algo Despus de Su Visita  Tambin puede enviarnos un mensaje a travs de Pharmacist, community. Por lo general respondemos a los mensajes de MyChart en el transcurso  de 1 a 2 das hbiles.  Para renovar recetas, por favor pida a su farmacia que se ponga en contacto con nuestra oficina. Harland Dingwall de fax es [place] [phone].  Si tiene un asunto urgente cuando la clnica est cerrada y que no puede esperar hasta el siguiente da hbil, puede llamar/localizar a su doctor(a) al nmero que aparece a continuacin.   Por favor, tenga en cuenta que aunque hacemos todo lo posible para estar disponibles para asuntos urgentes fuera del horario de [place], no estamos disponibles las 24 horas del da, los 7 das de la [place].   Si tiene un problema urgente y no puede comunicarse con nosotros, puede optar por buscar atencin mdica  en el consultorio de su doctor(a), en una clnica privada, en un centro de atencin urgente o en una sala de emergencias.  Si tiene Engineering geologist, por favor llame inmediatamente al 911 o vaya a la sala de emergencias.  Nmeros de bper  - Dr. Nehemiah Massed: [phone]  - Dra. Moye: [phone]  - Dra. Nicole Kindred: [phone]  En caso de inclemencias del [place], por favor llame a Johnsie Kindred principal al [phone] para una actualizacin sobre el [place] de cualquier retraso o cierre.  Consejos para la medicacin en dermatologa: Por favor, guarde las cajas en las que vienen los medicamentos de uso tpico para ayudarle a seguir las instrucciones sobre dnde y cmo usarlos. Las farmacias generalmente imprimen las instrucciones del medicamento slo en las cajas y no directamente en los tubos del [place].   Si su medicamento es muy caro, por favor, pngase en contacto con Zigmund Daniel llamando al [phone] y presione la opcin 4 o envenos un mensaje a travs de Pharmacist, community.   No podemos decirle cul ser su copago por los medicamentos por adelantado ya que esto es diferente dependiendo de la cobertura de su seguro. Sin embargo, es posible que podamos encontrar un medicamento sustituto a Electrical engineer un formulario  para que el seguro cubra el medicamento que se considera necesario.   Si se requiere una autorizacin previa para que su compaa de seguros [place] su medicamento, por favor permtanos de 1 a 2 das hbiles para completar este proceso.  Los precios de los medicamentos varan con frecuencia dependiendo del Environmental consultant de dnde se surte la receta y alguna farmacias pueden ofrecer precios ms baratos.  El sitio web www.goodrx.com tiene cupones para medicamentos de Airline pilot. Los precios aqu no tienen en cuenta lo que podra costar con la ayuda del seguro (puede ser ms barato con su  seguro), pero el sitio web puede darle el precio si no Field seismologist.  - Puede imprimir el cupn correspondiente y llevarlo con su receta a la farmacia.  - Tambin puede pasar por nuestra oficina durante el horario de atencin regular y Charity fundraiser una tarjeta de cupones de GoodRx.  - Si necesita que su receta se enve electrnicamente a una farmacia diferente, informe a nuestra oficina a travs de MyChart de [place] o por telfono llamando al [phone] y presione la opcin 4.

## 2021-08-18 ENCOUNTER — Ambulatory Visit (INDEPENDENT_AMBULATORY_CARE_PROVIDER_SITE_OTHER): Payer: Medicaid Other | Admitting: Dermatology

## 2021-08-18 ENCOUNTER — Encounter: Payer: Self-pay | Admitting: Dermatology

## 2021-08-18 DIAGNOSIS — Z4802 Encounter for removal of sutures: Secondary | ICD-10-CM

## 2021-08-18 DIAGNOSIS — D0359 Melanoma in situ of other part of trunk: Secondary | ICD-10-CM

## 2021-08-18 NOTE — Patient Instructions (Addendum)
After Suture Removal  If your medical team has placed Steri-Strips (white adhesive strips covering the surgical site to provide extra support): Keep the area dry until they fall off.  Do not peel them off. Just let them fall off on their own.  If the edges peel up, you can trim them with scissors.   If your team has not placed Steri-Strips: Wash the area daily with soap and water. Then coat the incision site with plain Vaseline and cover with a bandage. Do this daily for 5 days after the sutures are removed. After that, no additional wound care is generally needed.  However, if you would like to help fade the scar, you can apply a silicone scar cream, gel or sheet every night. The scar will remodel for one year after the procedure. If a skin cancer was removed, be sure to keep your appointment with your dermatologist for follow-up and let your dermatology team know if you have any new or changing spots between visits.   Melanoma ABCDEs  Melanoma is the most dangerous type of skin cancer, and is the leading cause of death from skin disease.  You are more likely to develop melanoma if you: Have light-colored skin, light-colored eyes, or red or blond hair Spend a lot of time in the sun Tan regularly, either outdoors or in a tanning bed Have had blistering sunburns, especially during childhood Have a close family member who has had a melanoma Have atypical moles or large birthmarks  Early detection of melanoma is key since treatment is typically straightforward and cure rates are extremely high if we catch it early.   The first sign of melanoma is often a change in a mole or a new dark spot.  The ABCDE system is a way of remembering the signs of melanoma.  A for asymmetry:  The two halves do not match. B for border:  The edges of the growth are irregular. C for color:  A mixture of colors are present instead of an even brown color. D for diameter:  Melanomas are usually (but not always)  greater than 6m - the size of a pencil eraser. E for evolution:  The spot keeps changing in size, shape, and color.  Please check your skin once per month between visits. You can use a small mirror in front and a large mirror behind you to keep an eye on the back side or your body.   If you see any new or changing lesions before your next follow-up, please call to schedule a visit.  Please continue daily skin protection including broad spectrum sunscreen SPF 30+ to sun-exposed areas, reapplying every 2 hours as needed when you're outdoors.   Staying in the shade or wearing long sleeves, sun glasses (UVA+UVB protection) and wide brim hats (4-inch brim around the entire circumference of the hat) are also recommended for sun protection.     Please call our office at [phone] any questions or concerns.Due to recent changes in healthcare laws, you may see results of your pathology and/or laboratory studies on MyChart before the doctors have had a chance to review them. We understand that in some cases there may be results that are confusing or concerning to you. Please understand that not all results are received at the same time and often the doctors may need to interpret multiple results in order to provide you with the best plan of care or course of treatment. Therefore, we ask that you please give [place]  2 business days to thoroughly review all your results before contacting the office for clarification. Should we see a critical lab result, you will be contacted sooner.   If You Need Anything After Your Visit  If you have any questions or concerns for your doctor, please call our main line at [phone] and press option 4 to reach your doctor's medical assistant. If no one answers, please leave a voicemail as directed and we will return your call as soon as possible. Messages left after 4 pm will be answered the following business day.   You may also send [place] a message via [place]. We  typically respond to MyChart messages within 1-2 business days.  For prescription refills, please ask your pharmacy to contact our office. Our fax number is [phone].  If you have an urgent issue when the clinic is closed that cannot wait until the next business day, you can page your doctor at the number below.    Please note that while we do our best to be available for urgent issues outside of office hours, we are not available 24/7.   If you have an urgent issue and are unable to reach [place], you may choose to seek medical care at your doctor's office, retail clinic, urgent care center, or emergency room.  If you have a medical emergency, please immediately call 911 or go to the emergency department.  Pager Numbers  - Dr. Nehemiah Massed: [phone]  - Dr. Laurence Ferrari: [phone]  - Dr. Nicole Kindred: [phone]  In the event of inclement weather, please call our main line at [phone] for an update on the status of any delays or closures.  Dermatology Medication Tips: Please keep the boxes that topical medications come in in order to help keep track of the instructions about where and how to use these. Pharmacies typically print the medication instructions only on the boxes and not directly on the medication tubes.   If your medication is too expensive, please contact our office at [phone] option 4 or send [place] a message through [place].   We are unable to tell what your co-pay for medications will be in advance as this is different depending on your insurance coverage. However, we may be able to find a substitute medication at lower cost or fill out paperwork to get insurance to cover a needed medication.   If a prior authorization is required to get your medication covered by your insurance company, please allow [place] 1-2 business days to complete this process.  Drug prices often vary depending on where the prescription is filled and some pharmacies may offer cheaper prices.  The  website www.goodrx.com contains coupons for medications through different pharmacies. The prices here do not account for what the cost may be with help from insurance (it may be cheaper with your insurance), but the website can give you the price if you did not use any insurance.  - You can print the associated coupon and take it with your prescription to the pharmacy.  - You may also stop by our office during regular business hours and pick up a GoodRx coupon card.  - If you need your prescription sent electronically to a different pharmacy, notify our office through [facility] or by phone at [phone] option 4.     Si Usted Necesita Algo Despus de Su Visita  Tambin puede enviarnos un mensaje a travs de Pharmacist, community. Por lo general respondemos a los mensajes de MyChart en el transcurso de 1 a 2  das hbiles.  Para renovar recetas, por favor pida a su farmacia que se ponga en contacto con nuestra oficina. Harland Dingwall de fax es [place] [phone].  Si tiene un asunto urgente cuando la clnica est cerrada y que no puede esperar hasta el siguiente da hbil, puede llamar/localizar a su doctor(a) al nmero que aparece a continuacin.   Por favor, tenga en cuenta que aunque hacemos todo lo posible para estar disponibles para asuntos urgentes fuera del horario de [place], no estamos disponibles las 24 horas del da, los 7 das de la [place].   Si tiene un problema urgente y no puede comunicarse con nosotros, puede optar por buscar atencin mdica  en el consultorio de su doctor(a), en una clnica privada, en un centro de atencin urgente o en una sala de emergencias.  Si tiene Engineering geologist, por favor llame inmediatamente al 911 o vaya a la sala de emergencias.  Nmeros de bper  - Dr. Nehemiah Massed: [phone]  - Dra. Moye: [phone]  - Dra. Nicole Kindred: [phone]  En caso de inclemencias del [place], por favor llame a Johnsie Kindred principal al [phone] para una  actualizacin sobre el [place] de cualquier retraso o cierre.  Consejos para la medicacin en dermatologa: Por favor, guarde las cajas en las que vienen los medicamentos de uso tpico para ayudarle a seguir las instrucciones sobre dnde y cmo usarlos. Las farmacias generalmente imprimen las instrucciones del medicamento slo en las cajas y no directamente en los tubos del [place].   Si su medicamento es muy caro, por favor, pngase en contacto con Zigmund Daniel llamando al [phone] y presione la opcin 4 o envenos un mensaje a travs de Pharmacist, community.   No podemos decirle cul ser su copago por los medicamentos por adelantado ya que esto es diferente dependiendo de la cobertura de su seguro. Sin embargo, es posible que podamos encontrar un medicamento sustituto a Electrical engineer un formulario para que el seguro cubra el medicamento que se considera necesario.   Si se requiere una autorizacin previa para que su compaa de seguros [place] su medicamento, por favor permtanos de 1 a 2 das hbiles para completar este proceso.  Los precios de los medicamentos varan con frecuencia dependiendo del Environmental consultant de dnde se surte la receta y alguna farmacias pueden ofrecer precios ms baratos.  El sitio web www.goodrx.com tiene cupones para medicamentos de Airline pilot. Los precios aqu no tienen en cuenta lo que podra costar con la ayuda del seguro (puede ser ms barato con su seguro), pero el sitio web puede darle el precio si no utiliz Research scientist (physical sciences).  - Puede imprimir el cupn correspondiente y llevarlo con su receta a la farmacia.  - Tambin puede pasar por nuestra oficina durante el horario de atencin regular y Charity fundraiser una tarjeta de cupones de GoodRx.  - Si necesita que su receta se enve electrnicamente a una farmacia diferente, informe a nuestra oficina a travs de MyChart de [place] o por telfono llamando al [phone] y presione la opcin 4.

## 2021-08-18 NOTE — Progress Notes (Signed)
   Follow-Up Visit   Subjective  [name] is a 56 y.o. male who presents for the following: Suture / Staple Removal (Patient here today to have sutures removed at right mid back. Bx proven melanoma in situ margins clear. Patient reports no issues and healing well. ).    The following portions of the chart were reviewed this encounter and updated as appropriate:      Review of Systems: No other skin or systemic complaints except as noted in HPI or Assessment and Plan.   Objective  Well appearing patient in no apparent distress; mood and affect are within normal limits.  A focused examination was performed including right mid back. Relevant physical exam findings are noted in the Assessment and Plan.   Assessment & Plan  Melanoma in situ of back (Lupus)  Encounter for Removal of Sutures - Incision site at the right mid back 6 cm lat to spine is clean, dry and intact - Wound cleansed, sutures removed, wound cleansed and steri strips applied.  - Discussed pathology results showing residual melanoma in situ margins free  - Patient advised to keep steri-strips dry until they fall off. - Scars remodel for a full year. - Once steri-strips fall off, patient can apply over-the-counter silicone scar cream each night to help with scar remodeling if desired. - Patient advised to call with any concerns or if they notice any new or changing lesions.  Return in about 3 months (around 11/18/2021) for tbse recheck right back hx of melanoma. I, Ruthell Rummage, CMA, am acting as scribe for Brendolyn Patty, MD.  Documentation: I have reviewed the above documentation for accuracy and completeness, and I agree with the above.  Brendolyn Patty MD

## 2021-08-26 ENCOUNTER — Ambulatory Visit: Payer: Medicaid Other | Admitting: Podiatry

## 2021-08-26 ENCOUNTER — Encounter: Payer: Self-pay | Admitting: Podiatry

## 2021-08-26 DIAGNOSIS — M79675 Pain in left toe(s): Secondary | ICD-10-CM

## 2021-08-26 DIAGNOSIS — M79674 Pain in right toe(s): Secondary | ICD-10-CM | POA: Diagnosis not present

## 2021-08-26 DIAGNOSIS — B351 Tinea unguium: Secondary | ICD-10-CM

## 2021-08-26 DIAGNOSIS — L089 Local infection of the skin and subcutaneous tissue, unspecified: Secondary | ICD-10-CM

## 2021-08-26 NOTE — Progress Notes (Signed)
Complaint:  Visit Type: Patient returns to my office for continued preventative foot care services. Complaint: Patient states" my nails have grown long and thick and become painful to walk and wear shoes" Patient has been diagnosed with DM with no foot complications. The patient presents for preventative foot care services.  Patient has amputates fifth toe right foot.  Podiatric Exam: Vascular: dorsalis pedis and posterior tibial pulses are palpable bilateral. Capillary return is immediate. Temperature gradient is WNL. Skin turgor WNL  Sensorium: Normal Semmes Weinstein monofilament test. Normal tactile sensation bilaterally. Nail Exam: Pt has thick disfigured discolored nails with subungual debris noted bilateral hallux nails. Ulcer Exam: There is no evidence of ulcer or pre-ulcerative changes or infection. Orthopedic Exam: Muscle tone and strength are WNL. No limitations in general ROM. No crepitus or effusions noted. Foot type and digits show no abnormalities. Bony prominences are unremarkable. Skin: No Porokeratosis. Amputates fifth toe right foot.  Diagnosis:  Onychomycosis, , Pain in right toe, pain in left toes  Treatment & Plan Procedures and Treatment: Consent by patient was obtained for treatment procedures.   Debridement of mycotic and hypertrophic toenails, 1 through 5 bilateral and clearing of subungual debris. No ulceration, no infection noted.  Return Visit-Office Procedure: Patient instructed to return to the office for a follow up visit 4  months for continued evaluation and treatment.    Gardiner Barefoot DPM

## 2021-08-30 LAB — SURGICAL PATHOLOGY

## 2021-09-09 ENCOUNTER — Other Ambulatory Visit: Payer: Self-pay

## 2021-09-09 ENCOUNTER — Other Ambulatory Visit: Payer: Self-pay | Admitting: Dermatology

## 2021-09-09 DIAGNOSIS — L659 Nonscarring hair loss, unspecified: Secondary | ICD-10-CM

## 2021-09-09 DIAGNOSIS — L280 Lichen simplex chronicus: Secondary | ICD-10-CM

## 2021-09-09 MED ORDER — MOMETASONE FUROATE 0.1 % EX CREA: TOPICAL_CREAM | CUTANEOUS | 1 refills | Status: DC

## 2021-09-20 ENCOUNTER — Ambulatory Visit: Payer: Medicaid Other | Admitting: Podiatry

## 2021-09-20 DIAGNOSIS — E11621 Type 2 diabetes mellitus with foot ulcer: Secondary | ICD-10-CM

## 2021-09-20 DIAGNOSIS — L97512 Non-pressure chronic ulcer of other part of right foot with fat layer exposed: Secondary | ICD-10-CM

## 2021-09-20 MED ORDER — GENTAMICIN SULFATE 0.1 % EX OINT: 1.0000 | TOPICAL_OINTMENT | Freq: Two times a day (BID) | CUTANEOUS | 0 refills | Status: DC

## 2021-09-20 NOTE — Patient Instructions (Signed)
Change the dressing twice a day after washing with antibacterial soap (like Dial) and apply a clean new dressing

## 2021-09-20 NOTE — Progress Notes (Signed)
  Subjective:  Patient ID: [name], male    DOB: 08-21-1965,  MRN: [id number]  Chief Complaint  Patient presents with   Diabetic Ulcer     ulcer on toe    56 y.o. male returns with the above complaint. History confirmed with patient.  He returns today for follow-up he noticed the wounds had returned last week  Objective:  Physical Exam: warm, good capillary refill, no trophic changes or ulcerative lesions, and normal DP and PT pulses.  Full-thickness ulceration with exposed subcutaneous tissue plantar hallux measuring 0.4 cm x 0.5 cm x 0.2 cm  ulceration with no exposed bone tendon or joint.  Fully granular wound bed.       1. Diabetic ulcer of toe of right foot associated with type 2 diabetes mellitus, with fat layer exposed ([place])          Plan:  Patient was evaluated and treated and all questions answered.   Ulcer right hallux -We discussed the etiology and factors that are a part of the wound healing process.  We also discussed the risk of infection both soft tissue and osteomyelitis from open ulceration.  Discussed the risk of limb loss if this happens or worsens. -Debridement as below. -Dressed with Iodosorb, DSD. -Continue home dressing changes daily with 4 x 4 gauze and gentamicin ointments   Procedure: Excisional Debridement of Wound Rationale: Removal of non-viable soft tissue from the wound to promote healing.  Anesthesia: none Post-Debridement Wound Measurements: 0.4 cm x 0.5 cm x 0.2 cm  Type of Debridement: Sharp Excisional Tissue Removed: Non-viable soft tissue Depth of Debridement: subcutaneous tissue. Technique: Sharp excisional debridement to bleeding, viable wound base.  Dressing: Dry, sterile, compression dressing. Disposition: Patient tolerated procedure well.     Return in about 3 weeks (around 10/11/2021) for wound care.

## 2021-10-13 ENCOUNTER — Ambulatory Visit: Payer: Medicaid Other | Admitting: Podiatry

## 2021-10-13 DIAGNOSIS — L97512 Non-pressure chronic ulcer of other part of right foot with fat layer exposed: Secondary | ICD-10-CM | POA: Diagnosis not present

## 2021-10-13 DIAGNOSIS — E11621 Type 2 diabetes mellitus with foot ulcer: Secondary | ICD-10-CM

## 2021-10-13 NOTE — Progress Notes (Signed)
  Subjective:  Patient ID: [name], male    DOB: Apr 19, 1965,  MRN: [id number]  Chief Complaint  Patient presents with   Diabetic Ulcer    3 week follow up right foot/toe    56 y.o. male returns with the above complaint. History confirmed with patient.  Says he is doing a little bit better  Objective:  Physical Exam: warm, good capillary refill, no trophic changes or ulcerative lesions, and normal DP and PT pulses.  Full-thickness ulceration with exposed subcutaneous tissue plantar hallux measuring 0.2 cm x 0.52cm x 0.2 cm   ulceration with no exposed bone tendon or joint.  Fully granular wound bed.  Surrounding hyperkeratosis      1. Diabetic ulcer of toe of right foot associated with type 2 diabetes mellitus, with fat layer exposed ([place])           Plan:  Patient was evaluated and treated and all questions answered.   Ulcer right hallux -We discussed the etiology and factors that are a part of the wound healing process.  We also discussed the risk of infection both soft tissue and osteomyelitis from open ulceration.  Discussed the risk of limb loss if this happens or worsens. -Debridement as below. -Dressed with Iodosorb, DSD. -Continue home dressing changes daily with 4 x 4 gauze and gentamicin ointments   Procedure: Excisional Debridement of Wound Rationale: Removal of non-viable soft tissue from the wound to promote healing.  Anesthesia: none Post-Debridement Wound Measurements:  Type of Debridement: Sharp Excisional Tissue Removed: Non-viable soft tissue Depth of Debridement: subcutaneous tissue. Technique: Sharp excisional debridement to bleeding, viable wound base.  Dressing: Dry, sterile, compression dressing. Disposition: Patient tolerated procedure well.     Return in about 1 month (around 11/13/2021), or if symptoms worsen or fail to improve, for wound care.

## 2021-11-10 ENCOUNTER — Ambulatory Visit: Payer: Medicaid Other | Admitting: Podiatry

## 2021-11-10 DIAGNOSIS — L97511 Non-pressure chronic ulcer of other part of right foot limited to breakdown of skin: Secondary | ICD-10-CM | POA: Diagnosis not present

## 2021-11-10 DIAGNOSIS — E11621 Type 2 diabetes mellitus with foot ulcer: Secondary | ICD-10-CM | POA: Diagnosis not present

## 2021-11-10 NOTE — Progress Notes (Signed)
  Subjective:  Patient ID: [name], male    DOB: [birth year],  MRN: [id number]  Chief Complaint  Patient presents with   Diabetic Ulcer    4 week follow up right toe    56 y.o. male returns with the above complaint. History confirmed with patient.  Says he is doing a little bit better  Objective:  Physical Exam: warm, good capillary refill, no trophic changes or ulcerative lesions, and normal DP and PT pulses.  Ulceration has healed with mild hyperkeratosis     1. Diabetic ulcer of toe of right foot associated with type 2 diabetes mellitus, limited to breakdown of skin ([place])           Plan:  Patient was evaluated and treated and all questions answered.   Ulcer right hallux -Ulcer has healed.  Advised to watch for signs symptoms of recurrence.  Return as needed if it does   Return in about 4 weeks (around 12/08/2021), or if symptoms worsen or fail to improve.

## 2021-11-22 ENCOUNTER — Ambulatory Visit: Payer: Medicaid Other | Admitting: Dermatology

## 2021-11-22 DIAGNOSIS — L578 Other skin changes due to chronic exposure to nonionizing radiation: Secondary | ICD-10-CM

## 2021-11-22 DIAGNOSIS — D225 Melanocytic nevi of trunk: Secondary | ICD-10-CM

## 2021-11-22 DIAGNOSIS — Z86006 Personal history of melanoma in-situ: Secondary | ICD-10-CM

## 2021-11-22 DIAGNOSIS — Z86018 Personal history of other benign neoplasm: Secondary | ICD-10-CM

## 2021-11-22 DIAGNOSIS — Z1283 Encounter for screening for malignant neoplasm of skin: Secondary | ICD-10-CM | POA: Diagnosis not present

## 2021-11-22 DIAGNOSIS — L814 Other melanin hyperpigmentation: Secondary | ICD-10-CM | POA: Diagnosis not present

## 2021-11-22 DIAGNOSIS — D229 Melanocytic nevi, unspecified: Secondary | ICD-10-CM

## 2021-11-22 DIAGNOSIS — D1801 Hemangioma of skin and subcutaneous tissue: Secondary | ICD-10-CM

## 2021-11-22 NOTE — Patient Instructions (Addendum)
     Melanoma ABCDEs  Melanoma is the most dangerous type of skin cancer, and is the leading cause of death from skin disease.  You are more likely to develop melanoma if you: Have light-colored skin, light-colored eyes, or red or blond hair Spend a lot of time in the sun Tan regularly, either outdoors or in a tanning bed Have had blistering sunburns, especially during childhood Have a close family member who has had a melanoma Have atypical moles or large birthmarks  Early detection of melanoma is key since treatment is typically straightforward and cure rates are extremely high if we catch it early.   The first sign of melanoma is often a change in a mole or a new dark spot.  The ABCDE system is a way of remembering the signs of melanoma.  A for asymmetry:  The two halves do not match. B for border:  The edges of the growth are irregular. C for color:  A mixture of colors are present instead of an even brown color. D for diameter:  Melanomas are usually (but not always) greater than 6mm - the size of a pencil eraser. E for evolution:  The spot keeps changing in size, shape, and color.  Please check your skin once per month between visits. You can use a small mirror in front and a large mirror behind you to keep an eye on the back side or your body.   If you see any new or changing lesions before your next follow-up, please call to schedule a visit.  Please continue daily skin protection including broad spectrum sunscreen SPF 30+ to sun-exposed areas, reapplying every 2 hours as needed when you're outdoors.   Staying in the shade or wearing long sleeves, sun glasses (UVA+UVB protection) and wide brim hats (4-inch brim around the entire circumference of the hat) are also recommended for sun protection.    Due to recent changes in healthcare laws, you may see results of your pathology and/or laboratory studies on MyChart before the doctors have had a chance to review them. We  understand that in some cases there may be results that are confusing or concerning to you. Please understand that not all results are received at the same time and often the doctors may need to interpret multiple results in order to provide you with the best plan of care or course of treatment. Therefore, we ask that you please give us 2 business days to thoroughly review all your results before contacting the office for clarification. Should we see a critical lab result, you will be contacted sooner.   If You Need Anything After Your Visit  If you have any questions or concerns for your doctor, please call our main line at [phone] and press option 4 to reach your doctor's medical assistant. If no one answers, please leave a voicemail as directed and we will return your call as soon as possible. Messages left after 4 pm will be answered the following business day.   You may also send us a message via MyChart. We typically respond to MyChart messages within 1-2 business days.  For prescription refills, please ask your pharmacy to contact our office. Our fax number is [phone].  If you have an urgent issue when the clinic is closed that cannot wait until the next business day, you can page your doctor at the number below.    Please note that while we do our best to be available for urgent issues   outside of office hours, we are not available 24/7.   If you have an urgent issue and are unable to reach us, you may choose to seek medical care at your doctor's office, retail clinic, urgent care center, or emergency room.  If you have a medical emergency, please immediately call 911 or go to the emergency department.  Pager Numbers  - Dr. Kowalski: [phone]  - Dr. Moye: [phone]  - Dr. Stewart: [phone]  In the event of inclement weather, please call our main line at [phone] for an update on the status of any delays or closures.  Dermatology Medication Tips: Please  keep the boxes that topical medications come in in order to help keep track of the instructions about where and how to use these. Pharmacies typically print the medication instructions only on the boxes and not directly on the medication tubes.   If your medication is too expensive, please contact our office at [phone] option 4 or send us a message through MyChart.   We are unable to tell what your co-pay for medications will be in advance as this is different depending on your insurance coverage. However, we may be able to find a substitute medication at lower cost or fill out paperwork to get insurance to cover a needed medication.   If a prior authorization is required to get your medication covered by your insurance company, please allow us 1-2 business days to complete this process.  Drug prices often vary depending on where the prescription is filled and some pharmacies may offer cheaper prices.  The website www.goodrx.com contains coupons for medications through different pharmacies. The prices here do not account for what the cost may be with help from insurance (it may be cheaper with your insurance), but the website can give you the price if you did not use any insurance.  - You can print the associated coupon and take it with your prescription to the pharmacy.  - You may also stop by our office during regular business hours and pick up a GoodRx coupon card.  - If you need your prescription sent electronically to a different pharmacy, notify our office through [facility] MyChart or by phone at [phone] option 4.     Si Usted Necesita Algo Despus de Su Visita  Tambin puede enviarnos un mensaje a travs de MyChart. Por lo general respondemos a los mensajes de MyChart en el transcurso de 1 a 2 das hbiles.  Para renovar recetas, por favor pida a su farmacia que se ponga en contacto con nuestra oficina. Nuestro nmero de fax es el [phone].  Si tiene un asunto urgente  cuando la clnica est cerrada y que no puede esperar hasta el siguiente da hbil, puede llamar/localizar a su doctor(a) al nmero que aparece a continuacin.   Por favor, tenga en cuenta que aunque hacemos todo lo posible para estar disponibles para asuntos urgentes fuera del horario de oficina, no estamos disponibles las 24 horas del da, los 7 das de la semana.   Si tiene un problema urgente y no puede comunicarse con nosotros, puede optar por buscar atencin mdica  en el consultorio de su doctor(a), en una clnica privada, en un centro de atencin urgente o en una sala de emergencias.  Si tiene una emergencia mdica, por favor llame inmediatamente al 911 o vaya a la sala de emergencias.  Nmeros de bper  - Dr. Kowalski: [phone]  - Dra. Moye: [phone]  - Dra. Stewart: [phone]  En caso   de inclemencias del tiempo, por favor llame a nuestra lnea principal al [phone] para una actualizacin sobre el estado de cualquier retraso o cierre.  Consejos para la medicacin en dermatologa: Por favor, guarde las cajas en las que vienen los medicamentos de uso tpico para ayudarle a seguir las instrucciones sobre dnde y cmo usarlos. Las farmacias generalmente imprimen las instrucciones del medicamento slo en las cajas y no directamente en los tubos del medicamento.   Si su medicamento es muy caro, por favor, pngase en contacto con nuestra oficina llamando al [phone] y presione la opcin 4 o envenos un mensaje a travs de MyChart.   No podemos decirle cul ser su copago por los medicamentos por adelantado ya que esto es diferente dependiendo de la cobertura de su seguro. Sin embargo, es posible que podamos encontrar un medicamento sustituto a menor costo o llenar un formulario para que el seguro cubra el medicamento que se considera necesario.   Si se requiere una autorizacin previa para que su compaa de seguros cubra su medicamento, por favor permtanos de 1 a 2  das hbiles para completar este proceso.  Los precios de los medicamentos varan con frecuencia dependiendo del lugar de dnde se surte la receta y alguna farmacias pueden ofrecer precios ms baratos.  El sitio web www.goodrx.com tiene cupones para medicamentos de diferentes farmacias. Los precios aqu no tienen en cuenta lo que podra costar con la ayuda del seguro (puede ser ms barato con su seguro), pero el sitio web puede darle el precio si no utiliz ningn seguro.  - Puede imprimir el cupn correspondiente y llevarlo con su receta a la farmacia.  - Tambin puede pasar por nuestra oficina durante el horario de atencin regular y recoger una tarjeta de cupones de GoodRx.  - Si necesita que su receta se enve electrnicamente a una farmacia diferente, informe a nuestra oficina a travs de MyChart de [place] o por telfono llamando al [phone] y presione la opcin 4.  

## 2021-11-22 NOTE — Progress Notes (Signed)
Follow-Up Visit   Subjective  [name] is a 56 y.o. 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  The patient presents for Total-Body Skin Exam (TBSE) for skin cancer screening and mole check.  The patient has spots, moles and lesions to be evaluated, some may be new or changing and the patient has concerns that these could be cancer.   The following portions of the chart were reviewed this encounter and updated as appropriate:       Review of Systems:  No other skin or systemic complaints except as noted in HPI or Assessment and Plan.  Objective  Well appearing patient in no apparent distress; mood and affect are within normal limits.  A full examination was performed including scalp, head, eyes, ears, nose, lips, neck, chest, axillae, abdomen, back, buttocks, bilateral upper extremities, bilateral lower extremities, hands, feet, fingers, toes, fingernails, and toenails. All findings within normal limits unless otherwise noted below.  R mid back, 6.5cm lat to spine Well healed scar with no evidence of recurrence  Multiple regular brown macules on back- photo compared, no changes  R upper abdomen, back 1.4 x 0.9cm brown macule regular pigment and border    Assessment & Plan   Lentigines - Scattered tan macules - Due to sun exposure - Benign-appearing, observe - Recommend daily broad spectrum sunscreen SPF 30+ to sun-exposed areas, reapply every 2 hours as needed. - Call for any changes - back  Melanocytic Nevi - Tan-brown and/or pink-flesh-colored symmetric macules and papules - Benign appearing on exam today - Observation - Call clinic for new or changing moles - Recommend daily use of broad spectrum spf 30+ sunscreen to sun-exposed areas.  - trunk  Hemangiomas - Red papules - Discussed benign nature - Observe - Call for any changes - back  Actinic  Damage - Chronic condition, secondary to cumulative UV/sun exposure - diffuse scaly erythematous macules with underlying dyspigmentation - Recommend daily broad spectrum sunscreen SPF 30+ to sun-exposed areas, reapply every 2 hours as needed.  - Staying in the shade or wearing long sleeves, sun glasses (UVA+UVB protection) and wide brim hats (4-inch brim around the entire circumference of the hat) are also recommended for sun protection.  - Call for new or changing lesions.  Skin cancer screening performed today.   History of Dysplastic Nevi - No evidence of recurrence today - Recommend regular full body skin exams - Recommend daily broad spectrum sunscreen SPF 30+ to sun-exposed areas, reapply every 2 hours as needed.  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 Observe for recurrence. Call clinic for new or changing lesions.  Recommend regular skin exams, daily broad-spectrum spf 30+ sunscreen use, and photoprotection.    Nevus R upper abdomen, back  Benign-appearing.  Observation.  Call clinic for new or changing moles.  Recommend daily use of broad spectrum spf 30+ sunscreen to sun-exposed areas.     Return in about 6 months (around 05/23/2022) for Hx of Melanoma IS, Hx of Dysplastic nevi.  I, Othelia Pulling, RMA, am acting as scribe for Brendolyn Patty, MD .  Documentation: I have reviewed the above documentation for accuracy and completeness, and I agree with the above.  Brendolyn Patty MD

## 2021-11-25 ENCOUNTER — Ambulatory Visit: Payer: Medicaid Other | Admitting: Podiatry

## 2021-11-26 ENCOUNTER — Other Ambulatory Visit: Payer: Self-pay | Admitting: Dermatology

## 2021-11-26 DIAGNOSIS — L280 Lichen simplex chronicus: Secondary | ICD-10-CM

## 2021-12-16 ENCOUNTER — Ambulatory Visit (INDEPENDENT_AMBULATORY_CARE_PROVIDER_SITE_OTHER): Payer: Medicaid Other | Admitting: Podiatry

## 2021-12-16 ENCOUNTER — Encounter: Payer: Self-pay | Admitting: Podiatry

## 2021-12-16 DIAGNOSIS — M79676 Pain in unspecified toe(s): Secondary | ICD-10-CM | POA: Diagnosis not present

## 2021-12-16 DIAGNOSIS — E11628 Type 2 diabetes mellitus with other skin complications: Secondary | ICD-10-CM

## 2021-12-16 DIAGNOSIS — L089 Local infection of the skin and subcutaneous tissue, unspecified: Secondary | ICD-10-CM

## 2021-12-16 DIAGNOSIS — Z89421 Acquired absence of other right toe(s): Secondary | ICD-10-CM

## 2021-12-16 DIAGNOSIS — G629 Polyneuropathy, unspecified: Secondary | ICD-10-CM

## 2021-12-16 DIAGNOSIS — B351 Tinea unguium: Secondary | ICD-10-CM

## 2021-12-16 NOTE — Progress Notes (Signed)
Complaint:  Visit Type: Patient returns to my office for continued preventative foot care services. Complaint: Patient states" my nails have grown long and thick and become painful to walk and wear shoes" Patient has been diagnosed with DM with no foot complications. The patient presents for preventative foot care services.  Patient has amputated fifth toe right foot.  Podiatric Exam: Vascular: dorsalis pedis and posterior tibial pulses are palpable bilateral. Capillary return is immediate. Temperature gradient is WNL. Skin turgor WNL  Sensorium: Normal Semmes Weinstein monofilament test. Normal tactile sensation bilaterally. Nail Exam: Pt has thick disfigured discolored nails with subungual debris noted bilateral hallux nails. Ulcer Exam: There is no evidence of ulcer or pre-ulcerative changes or infection. Orthopedic Exam: Muscle tone and strength are WNL. No limitations in general ROM. No crepitus or effusions noted. Foot type and digits show no abnormalities. Bony prominences are unremarkable. Skin: No Porokeratosis. Amputated fifth toe right foot.  Diagnosis:  Onychomycosis, , Pain in right toe, pain in left toes  Treatment & Plan Procedures and Treatment: Consent by patient was obtained for treatment procedures.   Debridement of mycotic and hypertrophic toenails, 1 through 5 bilateral and clearing of subungual debris. No ulceration, no infection noted.  Return Visit-Office Procedure: Patient instructed to return to the office for a follow up visit 4  months for continued evaluation and treatment.    Ollie Delano DPM 

## 2021-12-28 ENCOUNTER — Other Ambulatory Visit: Payer: Self-pay | Admitting: Podiatry

## 2022-01-05 ENCOUNTER — Ambulatory Visit (LOCAL_COMMUNITY_HEALTH_CENTER): Payer: Medicaid Other

## 2022-01-05 DIAGNOSIS — Z23 Encounter for immunization: Secondary | ICD-10-CM | POA: Diagnosis not present

## 2022-01-05 DIAGNOSIS — Z719 Counseling, unspecified: Secondary | ICD-10-CM

## 2022-01-05 NOTE — Progress Notes (Signed)
Client seen in nurse clinic for covid-19 vaccine. VIS and Comirnaty Information sheet provided.   Screening Questions:  Are you feeling sick today? No   Have you ever received a dose of COVID-19 Vaccine? [employer], [place], [place], [place], Other) Yes  If yes, which vaccine and how many doses?    [place] 4   Did you bring the vaccination record card or other documentation?  No   Do you have a health condition or are undergoing treatment that makes you moderately or severely immunocompromised? This would include, but not be limited to: cancer, HIV, organ transplant, immunosuppressive therapy/high-dose corticosteroids, or moderate/severe primary immunodeficiency.  No  Have you received COVID-19 vaccine before or during hematopoietic cell transplant (HCT) or CAR-T-cell therapies? No  Have you ever had an allergic reaction to: (This would include a severe allergic reaction or a reaction that caused hives, swelling, or respiratory distress, including wheezing.) A component of a COVID-19 vaccine or a previous dose of COVID-19 vaccine? No   Have you ever had an allergic reaction to another vaccine (other thanCOVID-19 vaccine) or an injectable medication? (This would include a severe allergic reaction or a reaction that caused hives, swelling, or respiratory distress, including wheezing.)   No    Do you have a history of any of the following:  Myocarditis or Pericarditis No  Dermal fillers:  No  Multisystem Inflammatory Syndrome (MIS-C or MIS-A)? No  COVID-19 disease within the past 3 months? No  Vaccinated with monkeypox vaccine in the last 4 weeks? No   Vaccine administered and tolerated well.  Observed for 15 minutes with no issues.  Copy of [facility] provided.     Charlisa Cham Shelda Pal, RN

## 2022-01-25 ENCOUNTER — Ambulatory Visit (LOCAL_COMMUNITY_HEALTH_CENTER): Payer: Medicaid Other

## 2022-01-25 DIAGNOSIS — Z23 Encounter for immunization: Secondary | ICD-10-CM

## 2022-01-25 DIAGNOSIS — Z719 Counseling, unspecified: Secondary | ICD-10-CM

## 2022-01-25 NOTE — Progress Notes (Signed)
In nurse clinic for shingrix vaccine. Denies having shingles and no hx guillain barre syndrome. Tolerated vaccine well today. Updated NCIR copy given and recommended schedule explained. Josie Saunders, RN

## 2022-03-24 ENCOUNTER — Encounter: Payer: Self-pay | Admitting: Podiatry

## 2022-03-24 ENCOUNTER — Ambulatory Visit: Payer: Medicaid Other | Admitting: Podiatry

## 2022-03-24 VITALS — BP 165/103 | HR 57

## 2022-03-24 DIAGNOSIS — G629 Polyneuropathy, unspecified: Secondary | ICD-10-CM

## 2022-03-24 DIAGNOSIS — L089 Local infection of the skin and subcutaneous tissue, unspecified: Secondary | ICD-10-CM

## 2022-03-24 DIAGNOSIS — M79676 Pain in unspecified toe(s): Secondary | ICD-10-CM

## 2022-03-24 DIAGNOSIS — B351 Tinea unguium: Secondary | ICD-10-CM | POA: Diagnosis not present

## 2022-03-24 DIAGNOSIS — Z89421 Acquired absence of other right toe(s): Secondary | ICD-10-CM

## 2022-03-24 DIAGNOSIS — E11628 Type 2 diabetes mellitus with other skin complications: Secondary | ICD-10-CM

## 2022-03-24 NOTE — Progress Notes (Signed)
Complaint:  Visit Type: Patient returns to my office for continued preventative foot care services. Complaint: Patient states" my nails have grown long and thick and become painful to walk and wear shoes" Patient has been diagnosed with DM with no foot complications. The patient presents for preventative foot care services.  Patient has amputated fifth toe right foot.  Podiatric Exam: Vascular: dorsalis pedis and posterior tibial pulses are palpable bilateral. Capillary return is immediate. Temperature gradient is WNL. Skin turgor WNL  Sensorium: Normal Semmes Weinstein monofilament test. Normal tactile sensation bilaterally. Nail Exam: Pt has thick disfigured discolored nails with subungual debris noted bilateral hallux nails. Ulcer Exam: There is no evidence of ulcer or pre-ulcerative changes or infection. Orthopedic Exam: Muscle tone and strength are WNL. No limitations in general ROM. No crepitus or effusions noted. Foot type and digits show no abnormalities. Bony prominences are unremarkable. Skin: No Porokeratosis. Amputated fifth toe right foot.  Diagnosis:  Onychomycosis, , Pain in right toe, pain in left toes  Treatment & Plan Procedures and Treatment: Consent by patient was obtained for treatment procedures.   Debridement of mycotic and hypertrophic toenails, 1 through 5 bilateral and clearing of subungual debris. No ulceration, no infection noted.  Return Visit-Office Procedure: Patient instructed to return to the office for a follow up visit 4  months for continued evaluation and treatment.    Gardiner Barefoot DPM

## 2022-04-26 ENCOUNTER — Other Ambulatory Visit: Payer: Self-pay

## 2022-04-26 ENCOUNTER — Emergency Department
Admission: EM | Admit: 2022-04-26 | Discharge: 2022-04-26 | Disposition: A | Discharge status: Home / Self Care | Payer: Medicaid Other | Attending: Emergency Medicine | Admitting: Emergency Medicine

## 2022-04-26 DIAGNOSIS — I10 Essential (primary) hypertension: Secondary | ICD-10-CM | POA: Insufficient documentation

## 2022-04-26 DIAGNOSIS — R0989 Other specified symptoms and signs involving the circulatory and respiratory systems: Secondary | ICD-10-CM

## 2022-04-26 DIAGNOSIS — Z1152 Encounter for screening for COVID-19: Secondary | ICD-10-CM | POA: Diagnosis not present

## 2022-04-26 DIAGNOSIS — J069 Acute upper respiratory infection, unspecified: Secondary | ICD-10-CM | POA: Diagnosis not present

## 2022-04-26 DIAGNOSIS — R051 Acute cough: Secondary | ICD-10-CM | POA: Diagnosis present

## 2022-04-26 LAB — RESP PANEL BY RT-PCR (RSV, FLU A&B, COVID)  RVPGX2
Influenza A by PCR: NEGATIVE
Influenza B by PCR: NEGATIVE
Resp Syncytial Virus by PCR: NEGATIVE
SARS Coronavirus 2 by RT PCR: NEGATIVE

## 2022-04-26 MED ORDER — BENZONATATE 100 MG PO CAPS: 100.0000 mg | ORAL_CAPSULE | Freq: Three times a day (TID) | ORAL | 0 refills | Status: AC | PRN

## 2022-04-26 MED ORDER — GUAIFENESIN 100 MG/5ML PO LIQD: 5.0000 mL | ORAL | 0 refills | Status: AC | PRN

## 2022-04-26 NOTE — ED Notes (Signed)
Called the lab again to check the status of respiratory swab results. Lab tech informed me the results should be done in 14 minute. MD [place] notified.

## 2022-04-26 NOTE — ED Triage Notes (Addendum)
Pt reports not feeling well for the past 4 days, states he needs to cough but doesn't because it hurts, states no energy and bodyaches, reports checking his temp a couple days ago but didn't have a fever, lack of appetite

## 2022-04-26 NOTE — ED Notes (Signed)
MD [place] notified on delay on swab results

## 2022-04-26 NOTE — ED Notes (Signed)
Called lab to check the progress on respiratory swab sent down while pt was triaged out front. Lab informed me that swab was processed at 950, currently waiting for results to post in pt chart.

## 2022-04-26 NOTE — ED Provider Notes (Signed)
[facility] Provider Note   Event Date/Time   First MD Initiated Contact with Patient 04/26/22 [id number]     (approximate) History  Cough, Fatigue, and Generalized Body Aches  HPI [name] is a 57 y.o. male with a past medical history of hypertension who presents for cough, generalized bodyaches, fatigue, and subjective fevers over the last 3 days.  Patient states that he has been attempting to take Robitussin with minimal relief in his symptoms.  Patient states that he is now having pleuritic chest pain.  Denies any recent travel or sick contacts ROS: Patient currently denies any vision changes, tinnitus, difficulty speaking, facial droop, sore throat, shortness of breath, abdominal pain, nausea/vomiting/diarrhea, dysuria, or numbness/paresthesias in any extremity   Physical Exam  Triage Vital Signs: ED Triage Vitals  Enc Vitals Group     BP 04/26/22 0841 (!) 117/98     Pulse Rate 04/26/22 0841 65     Resp 04/26/22 0841 18     Temp 04/26/22 0841 98 F (36.7 C)     Temp Source 04/26/22 0841 Oral     SpO2 04/26/22 0841 93 %     Weight 04/26/22 0843 278 lb (126.1 kg)     Height 04/26/22 0843 6' 2"$  (1.88 m)     Head Circumference --      Peak Flow --      Pain Score 04/26/22 0843 8     Pain Loc --      Pain Edu? --      Excl. in [place]? --    Most recent vital signs: Vitals:   04/26/22 0841  BP: (!) 117/98  Pulse: 65  Resp: 18  Temp: 98 F (36.7 C)  SpO2: 93%   General: Awake, oriented x4. CV:  Good peripheral perfusion.  Resp:  Normal effort.  Abd:  No distention.  Other:  Middle-aged Caucasian overweight male laying in bed in no acute distress ED Results / Procedures / Treatments  Labs (all labs ordered are listed, but only abnormal results are displayed) Labs Reviewed  RESP PANEL BY RT-PCR (RSV, FLU A&B, COVID)  [id number]  PROCEDURES: Critical Care performed: No Procedures MEDICATIONS ORDERED IN ED: Medications - No data to  display IMPRESSION / MDM / [place] / ED COURSE  I reviewed the triage vital signs and the nursing notes.                              Patient's presentation is most consistent with acute presentation with potential threat to life or bodily function. Otherwise healthy patient presenting with constellation of symptoms likely representing uncomplicated viral upper respiratory symptoms as characterized by mild pharyngitis, cough, and subjective fevers.  Patient has tested negative for COVID/flu/RSV  Unlikely PTA/RPA: no hot potato voice, no uvular deviation, Unlikely Esophageal rupture: No history of dysphagia Unlikely deep space infection/Ludwigs Low suspicion for CNS infection bacterial sinusitis, or pneumonia given exam and history.  Unlikely Strep or EBV as centor negative and with no pharyngeal exudate, posterior LAD, or splenomegaly.  Will attempt to alleviate symptoms conservatively; no overt indications at this time for antibiotics. No respiratory distress, otherwise relatively well appearing and nontoxic. Will discuss prompt follow up with PMD and strict return precautions.   FINAL CLINICAL IMPRESSION(S) / ED DIAGNOSES   Final diagnoses:  Upper respiratory tract infection, unspecified type  Acute cough  Chest congestion   Rx / DC Orders  ED Discharge Orders          Ordered    benzonatate (TESSALON PERLES) 100 MG capsule  3 times daily PRN        04/26/22 1112    guaiFENesin (ROBITUSSIN) 100 MG/5ML liquid  Every 4 hours PRN        04/26/22 1112           Note:  This document was prepared using Dragon voice recognition software and may include unintentional dictation errors.   Naaman Plummer, MD 04/26/22 1116

## 2022-05-31 ENCOUNTER — Ambulatory Visit: Payer: Medicaid Other | Admitting: Dermatology

## 2022-05-31 VITALS — BP 135/83 | HR 65

## 2022-05-31 DIAGNOSIS — D2239 Melanocytic nevi of other parts of face: Secondary | ICD-10-CM

## 2022-05-31 DIAGNOSIS — Z86006 Personal history of melanoma in-situ: Secondary | ICD-10-CM | POA: Diagnosis not present

## 2022-05-31 DIAGNOSIS — D229 Melanocytic nevi, unspecified: Secondary | ICD-10-CM

## 2022-05-31 DIAGNOSIS — Z1283 Encounter for screening for malignant neoplasm of skin: Secondary | ICD-10-CM | POA: Diagnosis not present

## 2022-05-31 DIAGNOSIS — Z86018 Personal history of other benign neoplasm: Secondary | ICD-10-CM

## 2022-05-31 DIAGNOSIS — L821 Other seborrheic keratosis: Secondary | ICD-10-CM

## 2022-05-31 DIAGNOSIS — D225 Melanocytic nevi of trunk: Secondary | ICD-10-CM

## 2022-05-31 DIAGNOSIS — L578 Other skin changes due to chronic exposure to nonionizing radiation: Secondary | ICD-10-CM

## 2022-05-31 DIAGNOSIS — L814 Other melanin hyperpigmentation: Secondary | ICD-10-CM

## 2022-05-31 NOTE — Patient Instructions (Addendum)
     Melanoma ABCDEs  Melanoma is the most dangerous type of skin cancer, and is the leading cause of death from skin disease.  You are more likely to develop melanoma if you: Have light-colored skin, light-colored eyes, or red or blond hair Spend a lot of time in the sun Tan regularly, either outdoors or in a tanning bed Have had blistering sunburns, especially during childhood Have a close family member who has had a melanoma Have atypical moles or large birthmarks  Early detection of melanoma is key since treatment is typically straightforward and cure rates are extremely high if we catch it early.   The first sign of melanoma is often a change in a mole or a new dark spot.  The ABCDE system is a way of remembering the signs of melanoma.  A for asymmetry:  The two halves do not match. B for border:  The edges of the growth are irregular. C for color:  A mixture of colors are present instead of an even brown color. D for diameter:  Melanomas are usually (but not always) greater than 6mm - the size of a pencil eraser. E for evolution:  The spot keeps changing in size, shape, and color.  Please check your skin once per month between visits. You can use a small mirror in front and a large mirror behind you to keep an eye on the back side or your body.   If you see any new or changing lesions before your next follow-up, please call to schedule a visit.  Please continue daily skin protection including broad spectrum sunscreen SPF 30+ to sun-exposed areas, reapplying every 2 hours as needed when you're outdoors.   Staying in the shade or wearing long sleeves, sun glasses (UVA+UVB protection) and wide brim hats (4-inch brim around the entire circumference of the hat) are also recommended for sun protection.    Due to recent changes in healthcare laws, you may see results of your pathology and/or laboratory studies on MyChart before the doctors have had a chance to review them. We  understand that in some cases there may be results that are confusing or concerning to you. Please understand that not all results are received at the same time and often the doctors may need to interpret multiple results in order to provide you with the best plan of care or course of treatment. Therefore, we ask that you please give us 2 business days to thoroughly review all your results before contacting the office for clarification. Should we see a critical lab result, you will be contacted sooner.   If You Need Anything After Your Visit  If you have any questions or concerns for your doctor, please call our main line at [phone] and press option 4 to reach your doctor's medical assistant. If no one answers, please leave a voicemail as directed and we will return your call as soon as possible. Messages left after 4 pm will be answered the following business day.   You may also send us a message via MyChart. We typically respond to MyChart messages within 1-2 business days.  For prescription refills, please ask your pharmacy to contact our office. Our fax number is [phone].  If you have an urgent issue when the clinic is closed that cannot wait until the next business day, you can page your doctor at the number below.    Please note that while we do our best to be available for urgent issues   outside of office hours, we are not available 24/7.   If you have an urgent issue and are unable to reach us, you may choose to seek medical care at your doctor's office, retail clinic, urgent care center, or emergency room.  If you have a medical emergency, please immediately call 911 or go to the emergency department.  Pager Numbers  - Dr. Kowalski: [phone]  - Dr. Moye: [phone]  - Dr. Stewart: [phone]  In the event of inclement weather, please call our main line at [phone] for an update on the status of any delays or closures.  Dermatology Medication Tips: Please  keep the boxes that topical medications come in in order to help keep track of the instructions about where and how to use these. Pharmacies typically print the medication instructions only on the boxes and not directly on the medication tubes.   If your medication is too expensive, please contact our office at [phone] option 4 or send us a message through MyChart.   We are unable to tell what your co-pay for medications will be in advance as this is different depending on your insurance coverage. However, we may be able to find a substitute medication at lower cost or fill out paperwork to get insurance to cover a needed medication.   If a prior authorization is required to get your medication covered by your insurance company, please allow us 1-2 business days to complete this process.  Drug prices often vary depending on where the prescription is filled and some pharmacies may offer cheaper prices.  The website www.goodrx.com contains coupons for medications through different pharmacies. The prices here do not account for what the cost may be with help from insurance (it may be cheaper with your insurance), but the website can give you the price if you did not use any insurance.  - You can print the associated coupon and take it with your prescription to the pharmacy.  - You may also stop by our office during regular business hours and pick up a GoodRx coupon card.  - If you need your prescription sent electronically to a different pharmacy, notify our office through [place] MyChart or by phone at [phone] option 4.     Si Usted Necesita Algo Despus de Su Visita  Tambin puede enviarnos un mensaje a travs de MyChart. Por lo general respondemos a los mensajes de MyChart en el transcurso de 1 a 2 das hbiles.  Para renovar recetas, por favor pida a su farmacia que se ponga en contacto con nuestra oficina. Nuestro nmero de fax es el [phone].  Si tiene un asunto urgente  cuando la clnica est cerrada y que no puede esperar hasta el siguiente da hbil, puede llamar/localizar a su doctor(a) al nmero que aparece a continuacin.   Por favor, tenga en cuenta que aunque hacemos todo lo posible para estar disponibles para asuntos urgentes fuera del horario de oficina, no estamos disponibles las 24 horas del da, los 7 das de la semana.   Si tiene un problema urgente y no puede comunicarse con nosotros, puede optar por buscar atencin mdica  en el consultorio de su doctor(a), en una clnica privada, en un centro de atencin urgente o en una sala de emergencias.  Si tiene una emergencia mdica, por favor llame inmediatamente al 911 o vaya a la sala de emergencias.  Nmeros de bper  - Dr. Kowalski: [phone]  - Dra. Moye: [phone]  - Dra. Stewart: [phone]  En caso   de inclemencias del tiempo, por favor llame a nuestra lnea principal al [phone] para una actualizacin sobre el estado de cualquier retraso o cierre.  Consejos para la medicacin en dermatologa: Por favor, guarde las cajas en las que vienen los medicamentos de uso tpico para ayudarle a seguir las instrucciones sobre dnde y cmo usarlos. Las farmacias generalmente imprimen las instrucciones del medicamento slo en las cajas y no directamente en los tubos del medicamento.   Si su medicamento es muy caro, por favor, pngase en contacto con nuestra oficina llamando al [phone] y presione la opcin 4 o envenos un mensaje a travs de MyChart.   No podemos decirle cul ser su copago por los medicamentos por adelantado ya que esto es diferente dependiendo de la cobertura de su seguro. Sin embargo, es posible que podamos encontrar un medicamento sustituto a menor costo o llenar un formulario para que el seguro cubra el medicamento que se considera necesario.   Si se requiere una autorizacin previa para que su compaa de seguros cubra su medicamento, por favor permtanos de 1 a 2  das hbiles para completar este proceso.  Los precios de los medicamentos varan con frecuencia dependiendo del lugar de dnde se surte la receta y alguna farmacias pueden ofrecer precios ms baratos.  El sitio web www.goodrx.com tiene cupones para medicamentos de diferentes farmacias. Los precios aqu no tienen en cuenta lo que podra costar con la ayuda del seguro (puede ser ms barato con su seguro), pero el sitio web puede darle el precio si no utiliz ningn seguro.  - Puede imprimir el cupn correspondiente y llevarlo con su receta a la farmacia.  - Tambin puede pasar por nuestra oficina durante el horario de atencin regular y recoger una tarjeta de cupones de GoodRx.  - Si necesita que su receta se enve electrnicamente a una farmacia diferente, informe a nuestra oficina a travs de MyChart de [place] o por telfono llamando al [phone] y presione la opcin 4.  

## 2022-05-31 NOTE — Progress Notes (Signed)
   Follow-Up Visit   Subjective  [name] is a 57 y.o. male who presents for the following: Follow-up.  The patient presents for Upper Body Skin Exam (UBSE) for skin cancer screening and mole check.  The patient has spots, moles and lesions to be evaluated, some may be new or changing. 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 History of dysplastic nevus.  The following portions of the chart were reviewed this encounter and updated as appropriate:       Review of Systems:  No other skin or systemic complaints except as noted in HPI or Assessment and Plan.  Objective  Well appearing patient in no apparent distress; mood and affect are within normal limits.  All skin waist up examined.  right mid back, 6.0 cm lateral to spine Well healed scar with no evidence of recurrence.   R upper abdomen 1.4 x 0.9cm brown macule regular pigment and border   Left Mid Cheek Firm, flesh papule 5.16mm   back, chest Brown macules and papules. Photos compared, no changes                 Assessment & Plan  Skin cancer screening performed today.  Actinic Damage - chronic, secondary to cumulative UV radiation exposure/sun exposure over time - diffuse scaly erythematous macules with underlying dyspigmentation - Recommend daily broad spectrum sunscreen SPF 30+ to sun-exposed areas, reapply every 2 hours as needed.  - Recommend staying in the shade or wearing long sleeves, sun glasses (UVA+UVB protection) and wide brim hats (4-inch brim around the entire circumference of the hat). - Call for new or changing lesions.  Lentigines - Scattered tan macules - Due to sun exposure - Benign-appearing, observe - Recommend daily broad spectrum sunscreen SPF 30+ to sun-exposed areas, reapply every 2 hours as needed. - Call for any changes  Seborrheic Keratoses - Stuck-on, waxy, tan-brown papules and/or plaques  -  Benign-appearing - Discussed benign etiology and prognosis. - Observe - Call for any changes  Melanocytic Nevi - Tan-brown and/or pink-flesh-colored symmetric macules and papules, including back, abdomen. Photos compared from 07/27/2021. Stable.  - Benign appearing on exam today - Observation - Call clinic for new or changing moles - Recommend daily use of broad spectrum spf 30+ sunscreen to sun-exposed areas.   History of Dysplastic Nevus - No evidence of recurrence today - Recommend regular full body skin exams - Recommend daily broad spectrum sunscreen SPF 30+ to sun-exposed areas, reapply every 2 hours as needed.  - Call if any new or changing lesions are noted between office visits  History of melanoma in situ right mid back, 6.0 cm lateral to spine  Clear. Observe for recurrence. Call clinic for new or changing lesions.  Recommend regular skin exams, daily broad-spectrum spf 30+ sunscreen use, and photoprotection.    Nevus (3) R upper abdomen; back, chest; Left Mid Cheek  Benign-appearing. Stable compared to previous visit. Observation.  Call clinic for new or changing moles.  Recommend daily use of broad spectrum spf 30+ sunscreen to sun-exposed areas.     Return in about 6 months (around 12/01/2022) for UBSE, Hx melanoma, Hx Dysplastic Nevus.  IJamesetta Orleans, CMA, am acting as scribe for Brendolyn Patty, MD .  Documentation: I have reviewed the above documentation for accuracy and completeness, and I agree with the above.  Brendolyn Patty MD

## 2022-06-23 ENCOUNTER — Ambulatory Visit: Payer: Medicaid Other | Admitting: Podiatry

## 2022-06-23 DIAGNOSIS — M79676 Pain in unspecified toe(s): Secondary | ICD-10-CM

## 2022-06-23 DIAGNOSIS — B351 Tinea unguium: Secondary | ICD-10-CM | POA: Diagnosis not present

## 2022-06-23 DIAGNOSIS — Z89421 Acquired absence of other right toe(s): Secondary | ICD-10-CM

## 2022-06-23 DIAGNOSIS — E11628 Type 2 diabetes mellitus with other skin complications: Secondary | ICD-10-CM

## 2022-06-23 NOTE — Progress Notes (Addendum)
Complaint:  Visit Type: Patient returns to my office for continued preventative foot care services. Complaint: Patient states" my nails have grown long and thick and become painful to walk and wear shoes" Patient has been diagnosed with DM with no foot complications. The patient presents for preventative foot care services.  Patient has amputated fifth toe right foot.  Podiatric Exam: Vascular: dorsalis pedis and posterior tibial pulses are palpable bilateral. Capillary return is immediate. Temperature gradient is WNL. Skin turgor WNL  Sensorium: Normal Semmes Weinstein monofilament test. Normal tactile sensation bilaterally. Nail Exam: Pt has thick disfigured discolored nails with subungual debris noted bilateral hallux nails. Ulcer Exam: There is no evidence of ulcer or pre-ulcerative changes or infection. Orthopedic Exam: Muscle tone and strength are WNL. No limitations in general ROM. No crepitus or effusions noted. Foot type and digits show no abnormalities. Bony prominences are unremarkable. Skin: No Porokeratosis. Amputated fifth toe right foot.  Necrotic tissue noted distal right hallux.  Diagnosis:  Onychomycosis, , Pain in right toe, pain in left toes  Treatment & Plan Procedures and Treatment: Consent by patient was obtained for treatment procedures.   Debridement of mycotic and hypertrophic toenails, 1 through 5 bilateral and clearing of subungual debris. No ulceration, no infection noted. Padding dispensed right hallux. Return Visit-Office Procedure: Patient instructed to return to the office for a follow up visit 3   months for continued evaluation and treatment.    Helane Gunther DPM

## 2022-07-28 ENCOUNTER — Ambulatory Visit: Payer: Medicaid Other | Admitting: Dermatology

## 2022-07-28 VITALS — BP 130/90

## 2022-07-28 DIAGNOSIS — L821 Other seborrheic keratosis: Secondary | ICD-10-CM

## 2022-07-28 DIAGNOSIS — D225 Melanocytic nevi of trunk: Secondary | ICD-10-CM | POA: Diagnosis not present

## 2022-07-28 DIAGNOSIS — Z1283 Encounter for screening for malignant neoplasm of skin: Secondary | ICD-10-CM | POA: Diagnosis not present

## 2022-07-28 DIAGNOSIS — Z86006 Personal history of melanoma in-situ: Secondary | ICD-10-CM

## 2022-07-28 DIAGNOSIS — L578 Other skin changes due to chronic exposure to nonionizing radiation: Secondary | ICD-10-CM

## 2022-07-28 DIAGNOSIS — Z86018 Personal history of other benign neoplasm: Secondary | ICD-10-CM

## 2022-07-28 DIAGNOSIS — L814 Other melanin hyperpigmentation: Secondary | ICD-10-CM

## 2022-07-28 DIAGNOSIS — W908XXA Exposure to other nonionizing radiation, initial encounter: Secondary | ICD-10-CM

## 2022-07-28 DIAGNOSIS — X32XXXA Exposure to sunlight, initial encounter: Secondary | ICD-10-CM

## 2022-07-28 DIAGNOSIS — D1801 Hemangioma of skin and subcutaneous tissue: Secondary | ICD-10-CM | POA: Diagnosis not present

## 2022-07-28 DIAGNOSIS — D229 Melanocytic nevi, unspecified: Secondary | ICD-10-CM

## 2022-07-28 DIAGNOSIS — Z85820 Personal history of malignant melanoma of skin: Secondary | ICD-10-CM

## 2022-07-28 DIAGNOSIS — D485 Neoplasm of uncertain behavior of skin: Secondary | ICD-10-CM

## 2022-07-28 NOTE — Progress Notes (Signed)
Follow-Up Visit   Subjective  [name] is a 57 y.o. male who presents for the following: Skin Cancer Screening and Full Body Skin Exam - History of Melanoma in situ of right mid back 08/11/2021  The patient presents for Total-Body Skin Exam (TBSE) for skin cancer screening and mole check. The patient has spots, moles and lesions to be evaluated, some may be new or changing and the patient has concerns that these could be cancer.    The following portions of the chart were reviewed this encounter and updated as appropriate: medications, allergies, medical history  Review of Systems:  No other skin or systemic complaints except as noted in HPI or Assessment and Plan.  Objective  Well appearing patient in no apparent distress; mood and affect are within normal limits.  A full examination was performed including scalp, head, eyes, ears, nose, lips, neck, chest, axillae, abdomen, back, buttocks, bilateral upper extremities, bilateral lower extremities, hands, feet, fingers, toes, fingernails, and toenails. All findings within normal limits unless otherwise noted below.   Relevant physical exam findings are noted in the Assessment and Plan.  Right infrapectoral Irregular brown macule 1.5 cm    Assessment & Plan   HISTORY OF MELANOMA IN SITU - No evidence of recurrence today - Recommend regular full body skin exams - Recommend daily broad spectrum sunscreen SPF 30+ to sun-exposed areas, reapply every 2 hours as needed.  - Call if any new or changing lesions are noted between office visits  History of Dysplastic Nevi - No evidence of recurrence today - Recommend regular full body skin exams - Recommend daily broad spectrum sunscreen SPF 30+ to sun-exposed areas, reapply every 2 hours as needed.  - Call if any new or changing lesions are noted between office visits  LENTIGINES, SEBORRHEIC KERATOSES, HEMANGIOMAS - Benign normal skin lesions - Benign-appearing - Call for any  changes  MELANOCYTIC NEVI - Tan-brown and/or pink-flesh-colored symmetric macules and papules - Benign appearing on exam today - Observation - Call clinic for new or changing moles - Recommend daily use of broad spectrum spf 30+ sunscreen to sun-exposed areas.   ACTINIC DAMAGE - Chronic condition, secondary to cumulative UV/sun exposure - diffuse scaly erythematous macules with underlying dyspigmentation - Recommend daily broad spectrum sunscreen SPF 30+ to sun-exposed areas, reapply every 2 hours as needed.  - Staying in the shade or wearing long sleeves, sun glasses (UVA+UVB protection) and wide brim hats (4-inch brim around the entire circumference of the hat) are also recommended for sun protection.  - Call for new or changing lesions.  SKIN CANCER SCREENING PERFORMED TODAY.    Neoplasm of uncertain behavior of skin Right infrapectoral  Epidermal / dermal shaving  Lesion diameter (cm):  1.5 Informed consent: discussed and consent obtained   Timeout: patient name, date of birth, surgical site, and procedure verified   Procedure prep:  Patient was prepped and draped in usual sterile fashion Prep type:  Isopropyl alcohol Anesthesia: the lesion was anesthetized in a standard fashion   Anesthetic:  1% lidocaine w/ epinephrine 1-100,000 buffered w/ 8.4% NaHCO3 Instrument used: flexible razor blade   Hemostasis achieved with: pressure, aluminum chloride and electrodesiccation   Outcome: patient tolerated procedure well   Post-procedure details: sterile dressing applied and wound care instructions given   Dressing type: bandage and petrolatum    Specimen 1 - Surgical pathology Differential Diagnosis: Nevus vs dysplastic nevus Check Margins: No   Return for Follow up as scheduled.  I, Hollie  August Saucer, CMA, am acting as scribe for Armida Sans, MD .   Documentation: I have reviewed the above documentation for accuracy and completeness, and I agree with the above.  Armida Sans, MD

## 2022-07-28 NOTE — Patient Instructions (Addendum)
Shave Excision Benign Lesion Wound Care Instructions  Leave the original bandage on for 24 hours if possible.  If the bandage becomes soaked or soiled before that time, it is OK to remove it and examine the wound.  A small amount of post-operative bleeding is normal.  If excessive bleeding occurs, remove the bandage, place gauze over the site and apply continuous pressure (no peeking) over the area for 20-30 minutes.  If this does not stop the bleeding, try again for 40 minutes.  If this does not work, please call our clinic as soon as possible (even if after-hours).    Twice a day, cleanse the wound with soap and water.  If a thick crust develops you may use a Q-tip dipped into dilute hydrogen peroxide (mix 1:1 with water) to dissolve it.  Hydrogen peroxide can slow the healing process, so use it only as needed.  After washing, apply Vaseline jelly or Polysporin ointment.  For best healing, the wound should be covered with a layer of ointment at all times.  This may mean re-applying the ointment several times a day.  For open wounds, continue until it has healed.    If you have any swelling, keep the area elevated.  Some redness, tenderness and white or yellow material in the wound is normal healing.  If the area becomes very sore and red, or develops a thick yellow-green material (pus), it may be infected; please notify us.    Wound healing continues for up to one year following surgery.  It is not unusual to experience pain in the scar from time to time during the interval.  If the pain becomes severe or the scar thickens, you should notify the office.  A slight amount of redness in a scar is expected for the first six months.  After six months, the redness subsides and the scar will soften and fade.  The color difference becomes less noticeable with time.  If there are any problems, return for a post-op surgery check at your earliest convenience.  Please call our office for any questions or  concerns.  Due to recent changes in healthcare laws, you may see results of your pathology and/or laboratory studies on MyChart before the doctors have had a chance to review them. We understand that in some cases there may be results that are confusing or concerning to you. Please understand that not all results are received at the same time and often the doctors may need to interpret multiple results in order to provide you with the best plan of care or course of treatment. Therefore, we ask that you please give us 2 business days to thoroughly review all your results before contacting the office for clarification. Should we see a critical lab result, you will be contacted sooner.   If You Need Anything After Your Visit  If you have any questions or concerns for your doctor, please call our main line at [phone] and press option 4 to reach your doctor's medical assistant. If no one answers, please leave a voicemail as directed and we will return your call as soon as possible. Messages left after 4 pm will be answered the following business day.   You may also send us a message via MyChart. We typically respond to MyChart messages within 1-2 business days.  For prescription refills, please ask your pharmacy to contact our office. Our fax number is [phone].  If you have an urgent issue when the clinic is closed that   cannot wait until the next business day, you can page your doctor at the number below.    Please note that while we do our best to be available for urgent issues outside of office hours, we are not available 24/7.   If you have an urgent issue and are unable to reach us, you may choose to seek medical care at your doctor's office, retail clinic, urgent care center, or emergency room.  If you have a medical emergency, please immediately call 911 or go to the emergency department.  Pager Numbers  - Dr. Kowalski: [phone]  - Dr. Moye: [phone]  - Dr. Stewart:  [phone]  In the event of inclement weather, please call our main line at [phone] for an update on the status of any delays or closures.  Dermatology Medication Tips: Please keep the boxes that topical medications come in in order to help keep track of the instructions about where and how to use these. Pharmacies typically print the medication instructions only on the boxes and not directly on the medication tubes.   If your medication is too expensive, please contact our office at [phone] option 4 or send us a message through MyChart.   We are unable to tell what your co-pay for medications will be in advance as this is different depending on your insurance coverage. However, we may be able to find a substitute medication at lower cost or fill out paperwork to get insurance to cover a needed medication.   If a prior authorization is required to get your medication covered by your insurance company, please allow us 1-2 business days to complete this process.  Drug prices often vary depending on where the prescription is filled and some pharmacies may offer cheaper prices.  The website www.goodrx.com contains coupons for medications through different pharmacies. The prices here do not account for what the cost may be with help from insurance (it may be cheaper with your insurance), but the website can give you the price if you did not use any insurance.  - You can print the associated coupon and take it with your prescription to the pharmacy.  - You may also stop by our office during regular business hours and pick up a GoodRx coupon card.  - If you need your prescription sent electronically to a different pharmacy, notify our office through [facility] MyChart or by phone at [phone] option 4.     Si Usted Necesita Algo Despus de Su Visita  Tambin puede enviarnos un mensaje a travs de MyChart. Por lo general respondemos a los mensajes de MyChart en el transcurso de 1 a 2  das hbiles.  Para renovar recetas, por favor pida a su farmacia que se ponga en contacto con nuestra oficina. Nuestro nmero de fax es el [phone].  Si tiene un asunto urgente cuando la clnica est cerrada y que no puede esperar hasta el siguiente da hbil, puede llamar/localizar a su doctor(a) al nmero que aparece a continuacin.   Por favor, tenga en cuenta que aunque hacemos todo lo posible para estar disponibles para asuntos urgentes fuera del horario de oficina, no estamos disponibles las 24 horas del da, los 7 das de la semana.   Si tiene un problema urgente y no puede comunicarse con nosotros, puede optar por buscar atencin mdica  en el consultorio de su doctor(a), en una clnica privada, en un centro de atencin urgente o en una sala de emergencias.  Si tiene una emergencia mdica, por favor   llame inmediatamente al 911 o vaya a la sala de emergencias.  Nmeros de bper  - Dr. Kowalski: [phone]  - Dra. Moye: [phone]  - Dra. Stewart: [phone]  En caso de inclemencias del tiempo, por favor llame a nuestra lnea principal al [phone] para una actualizacin sobre el estado de cualquier retraso o cierre.  Consejos para la medicacin en dermatologa: Por favor, guarde las cajas en las que vienen los medicamentos de uso tpico para ayudarle a seguir las instrucciones sobre dnde y cmo usarlos. Las farmacias generalmente imprimen las instrucciones del medicamento slo en las cajas y no directamente en los tubos del medicamento.   Si su medicamento es muy caro, por favor, pngase en contacto con nuestra oficina llamando al [phone] y presione la opcin 4 o envenos un mensaje a travs de MyChart.   No podemos decirle cul ser su copago por los medicamentos por adelantado ya que esto es diferente dependiendo de la cobertura de su seguro. Sin embargo, es posible que podamos encontrar un medicamento sustituto a menor costo o llenar un formulario para que el  seguro cubra el medicamento que se considera necesario.   Si se requiere una autorizacin previa para que su compaa de seguros cubra su medicamento, por favor permtanos de 1 a 2 das hbiles para completar este proceso.  Los precios de los medicamentos varan con frecuencia dependiendo del lugar de dnde se surte la receta y alguna farmacias pueden ofrecer precios ms baratos.  El sitio web www.goodrx.com tiene cupones para medicamentos de diferentes farmacias. Los precios aqu no tienen en cuenta lo que podra costar con la ayuda del seguro (puede ser ms barato con su seguro), pero el sitio web puede darle el precio si no utiliz ningn seguro.  - Puede imprimir el cupn correspondiente y llevarlo con su receta a la farmacia.  - Tambin puede pasar por nuestra oficina durante el horario de atencin regular y recoger una tarjeta de cupones de GoodRx.  - Si necesita que su receta se enve electrnicamente a una farmacia diferente, informe a nuestra oficina a travs de MyChart de [facility] o por telfono llamando al [phone] y presione la opcin 4.  

## 2022-08-04 ENCOUNTER — Telehealth: Payer: Self-pay

## 2022-08-04 NOTE — Telephone Encounter (Signed)
Left pt message to call for bx results/sh °

## 2022-08-04 NOTE — Telephone Encounter (Signed)
Patient advised of BX results .aw 

## 2022-08-04 NOTE — Telephone Encounter (Signed)
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

## 2022-08-12 ENCOUNTER — Encounter: Payer: Self-pay | Admitting: Dermatology

## 2022-08-16 ENCOUNTER — Ambulatory Visit: Payer: Medicaid Other | Admitting: Podiatry

## 2022-08-16 ENCOUNTER — Encounter: Payer: Self-pay | Admitting: Podiatry

## 2022-08-16 VITALS — BP 149/95 | HR 50

## 2022-08-16 DIAGNOSIS — L600 Ingrowing nail: Secondary | ICD-10-CM

## 2022-08-16 MED ORDER — DOXYCYCLINE HYCLATE 100 MG PO TABS: 100.0000 mg | ORAL_TABLET | Freq: Two times a day (BID) | ORAL | 0 refills | Status: AC

## 2022-08-16 NOTE — Progress Notes (Signed)
IG RT medial Neuropathy  [address] Doxy Rtc 2 weeks    Chief Complaint  Patient presents with   Nail Problem    "It's the same problem I always have with my big toe."    Subjective: Patient PMHx T2DM presents today for evaluation of pain to the right great toe concerning for ingrown toenail.  Patient is concerned due to him being about diabetic.  Onset about a month ago.  Patient states that almost annually he develops pain and tenderness associated to the great toe joint when he begins to wear sandals for the summertime.  Patient is completely neuropathic.  Patient presents today for further treatment and evaluation.  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  Ingrowing portion of toenail noted to the medial aspect of the right hallux nail plate with surrounding erythema and drainage.  There is some maceration around the area as well.  The distal portion of the toe is callused secondary to improper shoe gear.  Vascular: DP and PT pulses palpable.  No clinical evidence of vascular compromise  Neruologic: Light touch and protective threshold absent  Musculoskeletal: No pedal deformity noted.  No prior amputations  Assesement: #1 Paronychia with ingrowing nail medial border right hallux nail plate  Plan of Care:  1. Patient evaluated.  2.  The soft tissue around the nail plate was debrided away and after debridement the decision was made to perform a partial temporary nail avulsion to the medial aspect  of the right hallux nail plate.  He is completely neuropathic and no local anesthesia was utilized.  The toe was prepped in aseptic manner and the medial border of the right hallux nail plate was cut all the way back to the nail root and the offending border of the nail plate was avulsed in its entirety.  Dressings applied with post care instructions provided 3.  Prescription for doxycycline 100 mg 2 times daily #20 4.  Return to clinic 3 weeks for follow-up  Felecia Shelling, DPM [facility]  Dr. Felecia Shelling, DPM    [address] [place], [place] [address]                Office [phone]  Fax [phone]

## 2022-08-30 ENCOUNTER — Ambulatory Visit: Payer: Medicaid Other | Admitting: Podiatry

## 2022-08-30 ENCOUNTER — Encounter: Payer: Self-pay | Admitting: Podiatry

## 2022-08-30 VITALS — BP 124/85 | HR 51

## 2022-08-30 DIAGNOSIS — L600 Ingrowing nail: Secondary | ICD-10-CM | POA: Diagnosis not present

## 2022-08-30 NOTE — Progress Notes (Signed)
   Chief Complaint  Patient presents with   Wound Check    "Well it's doing better."    Subjective: 57 y.o. male presents today status post temporary nail avulsion procedure of the medial border of the right great toe that was performed on 08/13/2022.  Patient doing well.  Completed oral antibiotics as prescribed.  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  Skin is warm, dry and supple. Nail and respective nail fold appears to be healing appropriately.   Assessment: #1 s/p partial temporary nail matrixectomy medial border right great toe   Plan of care: #1 patient was evaluated  #2 light debridement of the periungual debris was performed to the border of the respective toe and nail plate using a tissue nipper. #3 patient is to return to clinic on a PRN basis.   Felecia Shelling, DPM [facility]  Dr. Felecia Shelling, DPM    [address] [place], [place] [address]                Office [phone]  Fax [phone]

## 2022-09-26 ENCOUNTER — Ambulatory Visit (INDEPENDENT_AMBULATORY_CARE_PROVIDER_SITE_OTHER): Payer: Medicaid Other | Admitting: Podiatry

## 2022-09-26 DIAGNOSIS — Z91199 Patient's noncompliance with other medical treatment and regimen due to unspecified reason: Secondary | ICD-10-CM

## 2022-09-26 NOTE — Progress Notes (Signed)
1. No-show for appointment     

## 2022-10-07 ENCOUNTER — Ambulatory Visit: Payer: Medicaid Other | Admitting: Podiatry

## 2022-10-18 ENCOUNTER — Other Ambulatory Visit: Payer: Self-pay | Admitting: Dermatology

## 2022-10-18 DIAGNOSIS — L659 Nonscarring hair loss, unspecified: Secondary | ICD-10-CM

## 2022-11-10 ENCOUNTER — Other Ambulatory Visit: Payer: Self-pay

## 2022-11-10 DIAGNOSIS — F17210 Nicotine dependence, cigarettes, uncomplicated: Secondary | ICD-10-CM

## 2022-11-10 DIAGNOSIS — Z87891 Personal history of nicotine dependence: Secondary | ICD-10-CM

## 2022-11-10 DIAGNOSIS — Z122 Encounter for screening for malignant neoplasm of respiratory organs: Secondary | ICD-10-CM

## 2022-11-17 ENCOUNTER — Encounter: Payer: Self-pay | Admitting: Physician Assistant

## 2022-11-17 ENCOUNTER — Ambulatory Visit (INDEPENDENT_AMBULATORY_CARE_PROVIDER_SITE_OTHER): Payer: Medicaid Other | Admitting: Physician Assistant

## 2022-11-17 DIAGNOSIS — F17210 Nicotine dependence, cigarettes, uncomplicated: Secondary | ICD-10-CM | POA: Diagnosis not present

## 2022-11-17 NOTE — Patient Instructions (Signed)

## 2022-11-17 NOTE — Progress Notes (Signed)
Virtual Visit via Telephone Note  I connected with [name] on 11/17/22 at  9:00 AM EDT by telephone and verified that I am speaking with the correct person using two identifiers.  Location: Patient: Home Provider: Remotely   I discussed the limitations, risks, security and privacy concerns of performing an evaluation and management service by telephone and the availability of in person appointments. I also discussed with the patient that there may be a patient responsible charge related to this service. The patient expressed understanding and agreed to proceed.    Shared Decision Making Visit Lung Cancer Screening Program [phone])   Eligibility: Age 57 y.o. Pack Years Smoking History Calculation 21 (# packs/per year x # years smoked) Recent History of coughing up blood  No Unexplained weight loss? No ( >Than 15 pounds within the last 6 months ) Prior History Lung / other cancer no (Diagnosis within the last 5 years already requiring surveillance chest CT Scans). Smoking Status Current Smoker   Visit Components:     Discussion included one or more decision making aids. Yes Discussion included risk/benefits of screening. Yes Discussion included potential follow up diagnostic testing for abnormal scans. Yes Discussion included meaning and risk of over diagnosis. Yes Discussion included meaning and risk of False Positives. Yes Discussion included meaning of total radiation exposure. Yes  Counseling Included: Importance of adherence to annual lung cancer LDCT screening. Yes Impact of comorbidities on ability to participate in the program. Yes Ability and willingness to under diagnostic treatment. Yes  Smoking Cessation Counseling: Current Smokers:  Discussed importance of smoking cessation. Yes Information about tobacco cessation classes and interventions provided to patient. Yes Symptomatic Patient. No  Counseling: NA Diagnosis Code: Tobacco Use Z72.0 Asymptomatic  Patient Yes   Counseling (Intermediate counseling: > three minutes counseling) W1191  Written Order for Lung Cancer Screening with LDCT placed in Epic. Yes (CT Chest Lung Cancer Screening Low Dose W/O CM) [id number] Z12.2-Screening of respiratory organs Z87.891-Personal history of nicotine dependence    [name] Celine Mans, PA-C

## 2022-11-18 ENCOUNTER — Ambulatory Visit
Admission: RE | Admit: 2022-11-18 | Discharge: 2022-11-18 | Disposition: A | Discharge status: Home / Self Care | Payer: Medicaid Other | Source: Ambulatory Visit | Attending: Internal Medicine | Admitting: Internal Medicine

## 2022-11-18 DIAGNOSIS — F17210 Nicotine dependence, cigarettes, uncomplicated: Secondary | ICD-10-CM | POA: Diagnosis present

## 2022-11-18 DIAGNOSIS — Z122 Encounter for screening for malignant neoplasm of respiratory organs: Secondary | ICD-10-CM | POA: Insufficient documentation

## 2022-11-18 DIAGNOSIS — Z87891 Personal history of nicotine dependence: Secondary | ICD-10-CM | POA: Diagnosis present

## 2022-11-29 ENCOUNTER — Other Ambulatory Visit: Payer: Self-pay | Admitting: Acute Care

## 2022-11-29 ENCOUNTER — Ambulatory Visit: Payer: Medicaid Other | Admitting: Dermatology

## 2022-11-29 DIAGNOSIS — Z87891 Personal history of nicotine dependence: Secondary | ICD-10-CM

## 2022-11-29 DIAGNOSIS — Z122 Encounter for screening for malignant neoplasm of respiratory organs: Secondary | ICD-10-CM

## 2022-11-29 DIAGNOSIS — F17210 Nicotine dependence, cigarettes, uncomplicated: Secondary | ICD-10-CM

## 2022-12-01 ENCOUNTER — Encounter: Payer: Self-pay | Admitting: Dermatology

## 2022-12-01 ENCOUNTER — Ambulatory Visit: Payer: Medicaid Other | Admitting: Dermatology

## 2022-12-01 VITALS — BP 140/82

## 2022-12-01 DIAGNOSIS — D239 Other benign neoplasm of skin, unspecified: Secondary | ICD-10-CM

## 2022-12-01 DIAGNOSIS — L905 Scar conditions and fibrosis of skin: Secondary | ICD-10-CM

## 2022-12-01 DIAGNOSIS — L821 Other seborrheic keratosis: Secondary | ICD-10-CM

## 2022-12-01 DIAGNOSIS — Z1283 Encounter for screening for malignant neoplasm of skin: Secondary | ICD-10-CM | POA: Diagnosis not present

## 2022-12-01 DIAGNOSIS — B078 Other viral warts: Secondary | ICD-10-CM

## 2022-12-01 DIAGNOSIS — L578 Other skin changes due to chronic exposure to nonionizing radiation: Secondary | ICD-10-CM | POA: Diagnosis not present

## 2022-12-01 DIAGNOSIS — D225 Melanocytic nevi of trunk: Secondary | ICD-10-CM

## 2022-12-01 DIAGNOSIS — D2239 Melanocytic nevi of other parts of face: Secondary | ICD-10-CM

## 2022-12-01 DIAGNOSIS — W908XXA Exposure to other nonionizing radiation, initial encounter: Secondary | ICD-10-CM

## 2022-12-01 DIAGNOSIS — Z86018 Personal history of other benign neoplasm: Secondary | ICD-10-CM

## 2022-12-01 DIAGNOSIS — Z86006 Personal history of melanoma in-situ: Secondary | ICD-10-CM

## 2022-12-01 DIAGNOSIS — L814 Other melanin hyperpigmentation: Secondary | ICD-10-CM

## 2022-12-01 DIAGNOSIS — D229 Melanocytic nevi, unspecified: Secondary | ICD-10-CM

## 2022-12-01 DIAGNOSIS — D2371 Other benign neoplasm of skin of right lower limb, including hip: Secondary | ICD-10-CM

## 2022-12-01 NOTE — Progress Notes (Signed)
Follow-Up Visit   Subjective  [name] is a 57 y.o. male who presents for the following: Skin Cancer Screening and Upper Body Skin Exam.  Pt has h/o melanoma in situ  The patient presents for Upper Body Skin Exam (UBSE) for skin cancer screening and mole check. The patient has spots, moles and lesions to be evaluated, some may be new or changing and the patient may have concern these could be cancer.    The following portions of the chart were reviewed this encounter and updated as appropriate: medications, allergies, medical history  Review of Systems:  No other skin or systemic complaints except as noted in HPI or Assessment and Plan.  Objective  Well appearing patient in no apparent distress; mood and affect are within normal limits.  All skin waist up and legs examined. Relevant physical exam findings are noted in the Assessment and Plan.  L palm x 1 Firm scaly pap 2.57mm    Assessment & Plan   Other viral warts L palm x 1  Viral Wart (HPV) Counseling  Discussed viral / HPV (Human Papilloma Virus) etiology and risk of spread /infectivity to other areas of body as well as to other people.  Multiple treatments and methods may be required to clear warts and it is possible treatment may not be successful.  Treatment risks include discoloration; scarring and there is still potential for wart recurrence.  Discussed if not resolved with LN2 may start otc Compound W  Destruction of lesion - L palm x 1  Destruction method: cryotherapy   Informed consent: discussed and consent obtained   Lesion destroyed using liquid nitrogen: Yes   Region frozen until ice ball extended beyond lesion: Yes   Outcome: patient tolerated procedure well with no complications   Post-procedure details: wound care instructions given   Additional details:  Prior to procedure, discussed risks of blister formation, small wound, skin dyspigmentation, or rare scar following cryotherapy. Recommend  Vaseline ointment to treated areas while healing.    Skin cancer screening performed today.  Actinic Damage - Chronic condition, secondary to cumulative UV/sun exposure - diffuse scaly erythematous macules with underlying dyspigmentation - Recommend daily broad spectrum sunscreen SPF 30+ to sun-exposed areas, reapply every 2 hours as needed.  - Staying in the shade or wearing long sleeves, sun glasses (UVA+UVB protection) and wide brim hats (4-inch brim around the entire circumference of the hat) are also recommended for sun protection.  - Call for new or changing lesions.  Lentigines, Seborrheic Keratoses, Hemangiomas - Benign normal skin lesions - Benign-appearing - Call for any changes  Melanocytic Nevi - Tan-brown and/or pink-flesh-colored symmetric macules and papules, including  - L mid cheek - firm flesh papule 5.44mm - Benign appearing on exam today- photos compared of trunk, no changes - Observation. Stable. - Call clinic for new or changing moles - Recommend daily use of broad spectrum spf 30+ sunscreen to sun-exposed areas.    HISTORY OF MELANOMA IN SITU 5/23 - No evidence of recurrence today - R mid back 6.0cm lat to spine excised 08/11/21 - Recommend regular full body skin exams - Recommend daily broad spectrum sunscreen SPF 30+ to sun-exposed areas, reapply every 2 hours as needed.  - Call if any new or changing lesions are noted between office visits   HISTORY OF DYSPLASTIC NEVUS No evidence of recurrence today- L epigastric, R infra pectoral/R upper abdomen Recommend regular full body skin exams Recommend daily broad spectrum sunscreen SPF 30+ to sun-exposed  areas, reapply every 2 hours as needed.  Call if any new or changing lesions are noted between office visits    SCAR Exam: well healed linear scar spinal upper back secondary to cyst excision, no recurrence  Treatment Plan: Clear, observe   DERMATOFIBROMA L lower knee Exam: Firm pink/brown  papulenodule with dimple sign. Treatment Plan: A dermatofibroma is a benign growth possibly related to trauma, such as an insect bite, cut from shaving, or inflamed acne-type bump.  Treatment options to remove include shave or excision with resulting scar and risk of recurrence.  Since benign-appearing and not bothersome, will observe for now.     Return in about 6 months (around 05/31/2023) for UBSE, Hx of Melanoma IS, Hx of Dysplastic nevi.  I, Ardis Rowan, RMA, am acting as scribe for Willeen Niece, MD .   Documentation: I have reviewed the above documentation for accuracy and completeness, and I agree with the above.  Willeen Niece, MD

## 2022-12-01 NOTE — Patient Instructions (Addendum)
Cryotherapy Aftercare  Wash gently with soap and water everyday.   Apply Vaseline and Band-Aid daily until healed.    For wart on left palm, if dose not resolve you can start over the counter Compound W  Viral Warts & Molluscum Contagiosum  Viral warts and molluscum contagiosum are growths of the skin caused by viral infection of the skin. If you have been given the diagnosis of viral warts or molluscum contagiosum there are a few things that you must understand about your condition:  There is no guaranteed treatment method available for this condition. Multiple treatments may be required, The treatments may be time consuming and require multiple visits to the dermatology office. The treatment may be expensive. You will be charged each time you come into the office to have the spots treated. The treated areas may develop new lesions further complicating treatment. The treated areas may leave a scar. There is no guarantee that even after multiple treatments that the spots will be successfully treated. These are caused by a viral infection and can be spread to other areas of the skin and to other people by direct contact. Therefore, new spots may occur.  Due to recent changes in healthcare laws, you may see results of your pathology and/or laboratory studies on MyChart before the doctors have had a chance to review them. We understand that in some cases there may be results that are confusing or concerning to you. Please understand that not all results are received at the same time and often the doctors may need to interpret multiple results in order to provide you with the best plan of care or course of treatment. Therefore, we ask that you please give [place] 2 business days to thoroughly review all your results before contacting the office for clarification. Should we see a critical lab result, you will be contacted sooner.   If You Need Anything After Your Visit  If you have any questions or  concerns for your doctor, please call our main line at [phone] and press option 4 to reach your doctor's medical assistant. If no one answers, please leave a voicemail as directed and we will return your call as soon as possible. Messages left after 4 pm will be answered the following business day.   You may also send [place] a message via MyChart. We typically respond to MyChart messages within 1-2 business days.  For prescription refills, please ask your pharmacy to contact our office. Our fax number is [phone].  If you have an urgent issue when the clinic is closed that cannot wait until the next business day, you can page your doctor at the number below.    Please note that while we do our best to be available for urgent issues outside of office hours, we are not available 24/7.   If you have an urgent issue and are unable to reach [place], you may choose to seek medical care at your doctor's office, retail clinic, urgent care center, or emergency room.  If you have a medical emergency, please immediately call 911 or go to the emergency department.  Pager Numbers  - Dr. Gwen Pounds: [phone]  - Dr. Roseanne Reno: [phone]  - Dr. Katrinka Blazing: [phone]   In the event of inclement weather, please call our main line at [phone] for an update on the status of any delays or closures.  Dermatology Medication Tips: Please keep the boxes that topical medications come in in order to help keep track of the instructions about  where and how to use these. Pharmacies typically print the medication instructions only on the boxes and not directly on the medication tubes.   If your medication is too expensive, please contact our office at [phone] option 4 or send [place] a message through MyChart.   We are unable to tell what your co-pay for medications will be in advance as this is different depending on your insurance coverage. However, we may be able to find a substitute medication at lower cost  or fill out paperwork to get insurance to cover a needed medication.   If a prior authorization is required to get your medication covered by your insurance company, please allow [place] 1-2 business days to complete this process.  Drug prices often vary depending on where the prescription is filled and some pharmacies may offer cheaper prices.  The website www.goodrx.com contains coupons for medications through different pharmacies. The prices here do not account for what the cost may be with help from insurance (it may be cheaper with your insurance), but the website can give you the price if you did not use any insurance.  - You can print the associated coupon and take it with your prescription to the pharmacy.  - You may also stop by our office during regular business hours and pick up a GoodRx coupon card.  - If you need your prescription sent electronically to a different pharmacy, notify our office through [facility] or by phone at [phone] option 4.     Si Usted Necesita Algo Despus de Su Visita  Tambin puede enviarnos un mensaje a travs de Clinical cytogeneticist. Por lo general respondemos a los mensajes de MyChart en el transcurso de 1 a 2 das hbiles.  Para renovar recetas, por favor pida a su farmacia que se ponga en contacto con nuestra oficina. Annie Sable de fax es [place] [phone].  Si tiene un asunto urgente cuando la clnica est cerrada y que no puede esperar hasta el siguiente da hbil, puede llamar/localizar a su doctor(a) al nmero que aparece a continuacin.   Por favor, tenga en cuenta que aunque hacemos todo lo posible para estar disponibles para asuntos urgentes fuera del horario de [place], no estamos disponibles las 24 horas del da, los 7 [address]  [address] de la [place].   Si tiene un problema urgente y no puede comunicarse con nosotros, puede optar por buscar atencin mdica  en el consultorio de su doctor(a), en una clnica privada, en un centro de atencin urgente o en una  sala de emergencias.  Si tiene Engineer, drilling, por favor llame inmediatamente al 911 o vaya a la sala de emergencias.  Nmeros de bper  - Dr. Gwen Pounds: [phone]  - Dra. Roseanne Reno: [phone]  - Dr. Katrinka Blazing: [phone]   En caso de inclemencias del tiempo, por favor llame a Lacy Duverney principal al [phone] para una actualizacin sobre el [place] de cualquier retraso o cierre.  Consejos para la medicacin en dermatologa: Por favor, guarde las cajas en las que vienen los medicamentos de uso tpico para ayudarle a seguir las instrucciones sobre dnde y cmo usarlos. Las farmacias generalmente imprimen las instrucciones del medicamento slo en las cajas y no directamente en los tubos del [place].   Si su medicamento es muy caro, por favor, pngase en contacto con Rolm Gala llamando al [phone] y presione la opcin 4 o envenos un mensaje a travs de Clinical cytogeneticist.   No podemos decirle cul ser su copago por los medicamentos por adelantado ya que  esto es diferente dependiendo de la cobertura de su seguro. Sin embargo, es posible que podamos encontrar un medicamento sustituto a Audiological scientist un formulario para que el seguro cubra el medicamento que se considera necesario.   Si se requiere una autorizacin previa para que su compaa de seguros [place] su medicamento, por favor permtanos de 1 a 2 das hbiles para completar [address]  Los precios de los medicamentos varan con frecuencia dependiendo del Environmental consultant de dnde se surte la receta y alguna farmacias pueden ofrecer precios ms baratos.  El sitio web www.goodrx.com tiene cupones para medicamentos de Health and safety inspector. Los precios aqu no tienen en cuenta lo que podra costar con la ayuda del seguro (puede ser ms barato con su seguro), pero el sitio web puede darle el precio si no utiliz Tourist information centre manager.  - Puede imprimir el cupn correspondiente y llevarlo con su receta a la farmacia.  - Tambin puede  pasar por nuestra oficina durante el horario de atencin regular y Education officer, museum una tarjeta de cupones de GoodRx.  - Si necesita que su receta se enve electrnicamente a una farmacia diferente, informe a nuestra oficina a travs de MyChart de [place] o por telfono llamando al [phone] y presione la opcin 4.

## 2023-06-13 ENCOUNTER — Ambulatory Visit: Payer: Medicaid Other | Admitting: Dermatology

## 2023-06-13 DIAGNOSIS — D229 Melanocytic nevi, unspecified: Secondary | ICD-10-CM

## 2023-06-13 DIAGNOSIS — Z86006 Personal history of melanoma in-situ: Secondary | ICD-10-CM

## 2023-06-13 DIAGNOSIS — L814 Other melanin hyperpigmentation: Secondary | ICD-10-CM

## 2023-06-13 DIAGNOSIS — L578 Other skin changes due to chronic exposure to nonionizing radiation: Secondary | ICD-10-CM

## 2023-06-13 DIAGNOSIS — W908XXA Exposure to other nonionizing radiation, initial encounter: Secondary | ICD-10-CM | POA: Diagnosis not present

## 2023-06-13 DIAGNOSIS — L639 Alopecia areata, unspecified: Secondary | ICD-10-CM | POA: Diagnosis not present

## 2023-06-13 DIAGNOSIS — Z1283 Encounter for screening for malignant neoplasm of skin: Secondary | ICD-10-CM

## 2023-06-13 DIAGNOSIS — D1801 Hemangioma of skin and subcutaneous tissue: Secondary | ICD-10-CM

## 2023-06-13 DIAGNOSIS — L853 Xerosis cutis: Secondary | ICD-10-CM

## 2023-06-13 DIAGNOSIS — L738 Other specified follicular disorders: Secondary | ICD-10-CM

## 2023-06-13 DIAGNOSIS — Z86018 Personal history of other benign neoplasm: Secondary | ICD-10-CM

## 2023-06-13 DIAGNOSIS — L821 Other seborrheic keratosis: Secondary | ICD-10-CM

## 2023-06-13 DIAGNOSIS — L659 Nonscarring hair loss, unspecified: Secondary | ICD-10-CM

## 2023-06-13 MED ORDER — TRIAMCINOLONE ACETONIDE 10 MG/ML IJ SUSP
10.0000 mg | Freq: Once | INTRAMUSCULAR | Status: AC
  Administered 2023-06-13: 10 mg

## 2023-06-13 MED ORDER — CLOBETASOL PROPIONATE 0.05 % EX SOLN: CUTANEOUS | 1 refills | Status: AC

## 2023-06-13 NOTE — Progress Notes (Signed)
 Follow-Up Visit   Subjective  [name] is a 58 y.o. male who presents for the following: Skin Cancer Screening and Upper Body Skin Exam  The patient presents for Upper Body Skin Exam (UBSE) for skin cancer screening and mole check. The patient has spots, moles and lesions to be evaluated, some may be new or changing. The patient has patches of hair loss that he noticed 1 month ago. He had thi happen in the past, and got injections which helped.  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 History of dysplastic nevi. He has dryness of the hands.     The following portions of the chart were reviewed this encounter and updated as appropriate: medications, allergies, medical history  Review of Systems:  No other skin or systemic complaints except as noted in HPI or Assessment and Plan.  Objective  Well appearing patient in no apparent distress; mood and affect are within normal limits.  All skin waist up examined. Relevant physical exam findings are noted in the Assessment and Plan.      Assessment & Plan   ALOPECIA   Related Medications mometasone (ELOCON) 0.1 % cream APPLY ONCE DAILY TO AFFECTED AREA MONDAY THRU FRIDAY triamcinolone acetonide (KENALOG) 10 MG/ML injection 10 mg  Skin cancer screening performed today.  Actinic Damage - Chronic condition, secondary to cumulative UV/sun exposure - diffuse scaly erythematous macules with underlying dyspigmentation - Recommend daily broad spectrum sunscreen SPF 30+ to sun-exposed areas, reapply every 2 hours as needed.  - Staying in the shade or wearing long sleeves, sun glasses (UVA+UVB protection) and wide brim hats (4-inch brim around the entire circumference of the hat) are also recommended for sun protection.  - Call for new or changing lesions.  Lentigines, Seborrheic Keratoses, Hemangiomas - Benign normal skin lesions - Benign-appearing - Call for any changes  Melanocytic Nevi Photos compared  from 05/31/2022, stable - Tan-brown and/or pink-flesh-colored symmetric macules and papules - Benign appearing on exam today - Observation - Call clinic for new or changing moles - Recommend daily use of broad spectrum spf 30+ sunscreen to sun-exposed areas.   HISTORY OF MELANOMA IN SITU 5/23 - No evidence of recurrence today - R mid back 6.0cm lat to spine excised 08/11/21 - Recommend regular full body skin exams - Recommend daily broad spectrum sunscreen SPF 30+ to sun-exposed areas, reapply every 2 hours as needed.  - Call if any new or changing lesions are noted between office visits    HISTORY OF DYSPLASTIC NEVUS No evidence of recurrence today- L epigastric, R infra pectoral/R upper abdomen Recommend regular full body skin exams Recommend daily broad spectrum sunscreen SPF 30+ to sun-exposed areas, reapply every 2 hours as needed.  Call if any new or changing lesions are noted between office visits   Xerosis - diffuse xerotic patches at hands - recommend gentle, hydrating skin care - gentle skin care handout given - samples of CeraVe Psoriasis and AmLactin  Sebaceous Hyperplasia - Small yellow papules with a central dell - Benign-appearing - Observe. Call for changes.  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 Condition is bothersome/symptomatic for patient. Currently flared.   Alopecia areata is a chronic autoimmune condition localized to the skin which affects hair follicles and causes hair loss, most commonly in the scalp.  Cause is unknown.  Can be unpredictable, difficult to treat, and may  recur.  Treatments may include topical and intralesional steroids to decrease inflammation to allow for hair regrowth.  Other treatments may include narrowband ultraviolet B light treatment; topical Squairic acid immunotherapy application; topical or oral Minoxidil;  antihistamines and oral Jak inhibitors.  Treatment:  Start Clobetasol solution Apply to aa scalp twice daily.  Procedure Note Intralesional Injection  Location: scalp  Informed Consent: Discussed risks (infection, pain, bleeding, bruising, thinning of the skin, loss of skin pigment, lack of resolution, and recurrence of lesion) and benefits of the procedure, as well as the alternatives. Informed consent was obtained. Preparation: The area was prepared a standard fashion.  Anesthesia: none  Procedure Details: An intralesional injection was performed with  Kenalog 3 mg/mL . 3.0 cc in total were injected. NDC #: 1610-9604-54 Lot: [id number] Exp: 08/2025  Total number of injections: >7  Plan: The patient was instructed on post-op care. Recommend OTC analgesia as needed for pain.   Return in about 2 months (around 08/13/2023) for Alopecia areata. Also 6 mo f/u UBSE., Hx melanoma in situ.  ICherlyn Labella, CMA, am acting as scribe for Willeen Niece, MD .   Documentation: I have reviewed the above documentation for accuracy and completeness, and I agree with the above.  Willeen Niece, MD

## 2023-06-13 NOTE — Patient Instructions (Addendum)
 Recommend starting moisturizer with exfoliant (Urea, Salicylic acid, or Lactic acid) one to two times daily to help smooth rough and bumpy skin.  OTC options include Cetaphil Rough and Bumpy lotion (Urea), Eucerin Roughness Relief lotion or spot treatment cream (Urea), CeraVe SA lotion/cream for Rough and Bumpy skin (Sal Acid), Gold Bond Rough and Bumpy cream (Sal Acid), and AmLactin 12% lotion/cream (Lactic Acid).  If applying in morning, also apply sunscreen to sun-exposed areas, since these exfoliating moisturizers can increase sensitivity to sun.   Melanoma ABCDEs  Melanoma is the most dangerous type of skin cancer, and is the leading cause of death from skin disease.  You are more likely to develop melanoma if you: Have light-colored skin, light-colored eyes, or red or blond hair Spend a lot of time in the sun Tan regularly, either outdoors or in a tanning bed Have had blistering sunburns, especially during childhood Have a close family member who has had a melanoma Have atypical moles or large birthmarks  Early detection of melanoma is key since treatment is typically straightforward and cure rates are extremely high if we catch it early.   The first sign of melanoma is often a change in a mole or a new dark spot.  The ABCDE system is a way of remembering the signs of melanoma.  A for asymmetry:  The two halves do not match. B for border:  The edges of the growth are irregular. C for color:  A mixture of colors are present instead of an even brown color. D for diameter:  Melanomas are usually (but not always) greater than 6mm - the size of a pencil eraser. E for evolution:  The spot keeps changing in size, shape, and color.  Please check your skin once per month between visits. You can use a small mirror in front and a large mirror behind you to keep an eye on the back side or your body.   If you see any new or changing lesions before your next follow-up, please call to schedule a  visit.  Please continue daily skin protection including broad spectrum sunscreen SPF 30+ to sun-exposed areas, reapplying every 2 hours as needed when you're outdoors.   Staying in the shade or wearing long sleeves, sun glasses (UVA+UVB protection) and wide brim hats (4-inch brim around the entire circumference of the hat) are also recommended for sun protection.   Due to recent changes in healthcare laws, you may see results of your pathology and/or laboratory studies on MyChart before the doctors have had a chance to review them. We understand that in some cases there may be results that are confusing or concerning to you. Please understand that not all results are received at the same time and often the doctors may need to interpret multiple results in order to provide you with the best plan of care or course of treatment. Therefore, we ask that you please give [place] 2 business days to thoroughly review all your results before contacting the office for clarification. Should we see a critical lab result, you will be contacted sooner.   If You Need Anything After Your Visit  If you have any questions or concerns for your doctor, please call our main line at [phone] and press option 4 to reach your doctor's medical assistant. If no one answers, please leave a voicemail as directed and we will return your call as soon as possible. Messages left after 4 pm will be answered the following business day.  You may also send [place] a message via MyChart. We typically respond to MyChart messages within 1-2 business days.  For prescription refills, please ask your pharmacy to contact our office. Our fax number is [phone].  If you have an urgent issue when the clinic is closed that cannot wait until the next business day, you can page your doctor at the number below.    Please note that while we do our best to be available for urgent issues outside of office hours, we are not available 24/7.   If you  have an urgent issue and are unable to reach [place], you may choose to seek medical care at your doctor's office, retail clinic, urgent care center, or emergency room.  If you have a medical emergency, please immediately call 911 or go to the emergency department.  Pager Numbers  - Dr. Gwen Pounds: [phone]  - Dr. Roseanne Reno: [phone]  - Dr. Katrinka Blazing: [phone]   In the event of inclement weather, please call our main line at [phone] for an update on the status of any delays or closures.  Dermatology Medication Tips: Please keep the boxes that topical medications come in in order to help keep track of the instructions about where and how to use these. Pharmacies typically print the medication instructions only on the boxes and not directly on the medication tubes.   If your medication is too expensive, please contact our office at [phone] option 4 or send [place] a message through MyChart.   We are unable to tell what your co-pay for medications will be in advance as this is different depending on your insurance coverage. However, we may be able to find a substitute medication at lower cost or fill out paperwork to get insurance to cover a needed medication.   If a prior authorization is required to get your medication covered by your insurance company, please allow [place] 1-2 business days to complete this process.  Drug prices often vary depending on where the prescription is filled and some pharmacies may offer cheaper prices.  The website www.goodrx.com contains coupons for medications through different pharmacies. The prices here do not account for what the cost may be with help from insurance (it may be cheaper with your insurance), but the website can give you the price if you did not use any insurance.  - You can print the associated coupon and take it with your prescription to the pharmacy.  - You may also stop by our office during regular business hours and pick up a GoodRx coupon  card.  - If you need your prescription sent electronically to a different pharmacy, notify our office through [facility] or by phone at [phone] option 4.     Si Usted Necesita Algo Despus de Su Visita  Tambin puede enviarnos un mensaje a travs de Clinical cytogeneticist. Por lo general respondemos a los mensajes de MyChart en el transcurso de 1 a 2 das hbiles.  Para renovar recetas, por favor pida a su farmacia que se ponga en contacto con nuestra oficina. Annie Sable de fax es [place] [phone].  Si tiene un asunto urgente cuando la clnica est cerrada y que no puede esperar hasta el siguiente da hbil, puede llamar/localizar a su doctor(a) al nmero que aparece a continuacin.   Por favor, tenga en cuenta que aunque hacemos todo lo posible para estar disponibles para asuntos urgentes fuera del horario de [place], no estamos disponibles las 24 horas del da, los 7 [address]  [address] de la [place].  Si tiene un problema urgente y no puede comunicarse con nosotros, puede optar por buscar atencin mdica  en el consultorio de su doctor(a), en una clnica privada, en un centro de atencin urgente o en una sala de emergencias.  Si tiene Engineer, drilling, por favor llame inmediatamente al 911 o vaya a la sala de emergencias.  Nmeros de bper  - Dr. Gwen Pounds: [phone]  - Dra. Roseanne Reno: [phone]  - Dr. Katrinka Blazing: [phone]   En caso de inclemencias del tiempo, por favor llame a Lacy Duverney principal al [phone] para una actualizacin sobre el [place] de cualquier retraso o cierre.  Consejos para la medicacin en dermatologa: Por favor, guarde las cajas en las que vienen los medicamentos de uso tpico para ayudarle a seguir las instrucciones sobre dnde y cmo usarlos. Las farmacias generalmente imprimen las instrucciones del medicamento slo en las cajas y no directamente en los tubos del [place].   Si su medicamento es muy caro, por favor, pngase en contacto con Rolm Gala llamando al [phone] y presione la opcin 4 o envenos un mensaje a travs de Clinical cytogeneticist.   No podemos decirle cul ser su copago por los medicamentos por adelantado ya que esto es diferente dependiendo de la cobertura de su seguro. Sin embargo, es posible que podamos encontrar un medicamento sustituto a Audiological scientist un formulario para que el seguro cubra el medicamento que se considera necesario.   Si se requiere una autorizacin previa para que su compaa de seguros [place] su medicamento, por favor permtanos de 1 a 2 das hbiles para completar [address]  Los precios de los medicamentos varan con frecuencia dependiendo del Environmental consultant de dnde se surte la receta y alguna farmacias pueden ofrecer precios ms baratos.  El sitio web www.goodrx.com tiene cupones para medicamentos de Health and safety inspector. Los precios aqu no tienen en cuenta lo que podra costar con la ayuda del seguro (puede ser ms barato con su seguro), pero el sitio web puede darle el precio si no utiliz Tourist information centre manager.  - Puede imprimir el cupn correspondiente y llevarlo con su receta a la farmacia.  - Tambin puede pasar por nuestra oficina durante el horario de atencin regular y Education officer, museum una tarjeta de cupones de GoodRx.  - Si necesita que su receta se enve electrnicamente a una farmacia diferente, informe a nuestra oficina a travs de MyChart de [place] o por telfono llamando al [phone] y presione la opcin 4.

## 2023-08-22 ENCOUNTER — Encounter: Payer: Self-pay | Admitting: Dermatology

## 2023-08-22 ENCOUNTER — Ambulatory Visit: Admitting: Dermatology

## 2023-08-22 DIAGNOSIS — L639 Alopecia areata, unspecified: Secondary | ICD-10-CM

## 2023-08-22 DIAGNOSIS — L659 Nonscarring hair loss, unspecified: Secondary | ICD-10-CM

## 2023-08-22 MED ORDER — TRIAMCINOLONE ACETONIDE 10 MG/ML IJ SUSP
3.0000 mg | Freq: Once | INTRAMUSCULAR | Status: AC
  Administered 2023-08-22: 3 mg via INTRADERMAL

## 2023-08-22 NOTE — Progress Notes (Signed)
   Follow-Up Visit   Subjective  [name] is a 58 y.o. male who presents for the following: Alopecia areata. Scalp. 2 month follow up. ILK injections at last visit. Has not started Clobetasol  solution yet. Hair coming back in.    The following portions of the chart were reviewed this encounter and updated as appropriate: medications, allergies, medical history  Review of Systems:  No other skin or systemic complaints except as noted in HPI or Assessment and Plan.  Objective  Well appearing patient in no apparent distress; mood and affect are within normal limits.  A focused examination was performed of the following areas: Scalp  Relevant physical exam findings are noted in the Assessment and Plan.  Scalp Round, patchy areas of nonscarring hair loss. New hair growth present in areas.        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 Condition is improving with treatment but not currently at goal.   Repeat IL steroid injections today Intralesional injection - Scalp Location: scalp  Informed Consent: Discussed risks (infection, pain, bleeding, bruising, thinning of the skin, loss of skin pigment, lack of resolution, and recurrence of lesion) and benefits of the procedure, as well as the alternatives. Informed consent was obtained. Preparation: The area was prepared a standard fashion.  Anesthesia: None  Procedure Details: An intralesional injection was performed with Kenalog  3 mg/ml. 4 cc in total were injected.  Total number of injections: >7  Plan: The patient was instructed on post-op care. Recommend OTC analgesia as needed for pain.  NDC: 1610-9604-54 Lot: [id number] Exp: 07/11/2025   triamcinolone  acetonide (KENALOG ) 10 MG/ML injection 3 mg - Scalp  ALOPECIA   Related Medications mometasone  (ELOCON ) 0.1 % cream APPLY ONCE DAILY TO AFFECTED AREA MONDAY THRU  FRIDAY   Return in about 4 months (around 12/22/2023) for UBSE As Scheduled, Alopecia Areata Follow Up with Dr. Annette Barters.  I, Jill Parcell, CMA, am acting as scribe for Artemio Larry, MD.   Documentation: I have reviewed the above documentation for accuracy and completeness, and I agree with the above.  Artemio Larry, MD

## 2023-08-22 NOTE — Patient Instructions (Addendum)
 Intralesional steroid injection side effects were reviewed including thinning of the skin and discoloration, such as redness, lightening or darkening.   In a couple of months Start Clobetasol  solution twice a day to affected areas on scalp. Avoid applying to face, groin, and axilla. Use as directed. Long-term use can cause thinning of the skin.    Due to recent changes in healthcare laws, you may see results of your pathology and/or laboratory studies on MyChart before the doctors have had a chance to review them. We understand that in some cases there may be results that are confusing or concerning to you. Please understand that not all results are received at the same time and often the doctors may need to interpret multiple results in order to provide you with the best plan of care or course of treatment. Therefore, we ask that you please give us  2 business days to thoroughly review all your results before contacting the office for clarification. Should we see a critical lab result, you will be contacted sooner.   If You Need Anything After Your Visit  If you have any questions or concerns for your doctor, please call our main line at [phone] and press option 4 to reach your doctor's medical assistant. If no one answers, please leave a voicemail as directed and we will return your call as soon as possible. Messages left after 4 pm will be answered the following business day.   You may also send us  a message via MyChart. We typically respond to MyChart messages within 1-2 business days.  For prescription refills, please ask your pharmacy to contact our office. Our fax number is [phone].  If you have an urgent issue when the clinic is closed that cannot wait until the next business day, you can page your doctor at the number below.    Please note that while we do our best to be available for urgent issues outside of office hours, we are not available 24/7.   If you have an urgent  issue and are unable to reach us , you may choose to seek medical care at your doctor's office, retail clinic, urgent care center, or emergency room.  If you have a medical emergency, please immediately call 911 or go to the emergency department.  Pager Numbers  - Dr. Bary Likes: [phone]  - Dr. Annette Barters: [phone]  - Dr. Felipe Horton: [phone]   In the event of inclement weather, please call our main line at [phone] for an update on the status of any delays or closures.  Dermatology Medication Tips: Please keep the boxes that topical medications come in in order to help keep track of the instructions about where and how to use these. Pharmacies typically print the medication instructions only on the boxes and not directly on the medication tubes.   If your medication is too expensive, please contact our office at [phone] option 4 or send us  a message through MyChart.   We are unable to tell what your co-pay for medications will be in advance as this is different depending on your insurance coverage. However, we may be able to find a substitute medication at lower cost or fill out paperwork to get insurance to cover a needed medication.   If a prior authorization is required to get your medication covered by your insurance company, please allow us  1-2 business days to complete this process.  Drug prices often vary depending on where the prescription is filled and some pharmacies may offer cheaper prices.  The website www.goodrx.com contains coupons for medications through different pharmacies. The prices here do not account for what the cost may be with help from insurance (it may be cheaper with your insurance), but the website can give you the price if you did not use any insurance.  - You can print the associated coupon and take it with your prescription to the pharmacy.  - You may also stop by our office during regular business hours and pick up a GoodRx coupon card.  - If  you need your prescription sent electronically to a different pharmacy, notify our office through [facility] or by phone at [phone] option 4.     Si Usted Necesita Algo Despus de Su Visita  Tambin puede enviarnos un mensaje a travs de Clinical cytogeneticist. Por lo general respondemos a los mensajes de MyChart en el transcurso de 1 a 2 das hbiles.  Para renovar recetas, por favor pida a su farmacia que se ponga en contacto con nuestra oficina. Franz Jacks de fax es [place] [phone].  Si tiene un asunto urgente cuando la clnica est cerrada y que no puede esperar hasta el siguiente da hbil, puede llamar/localizar a su doctor(a) al nmero que aparece a continuacin.   Por favor, tenga en cuenta que aunque hacemos todo lo posible para estar disponibles para asuntos urgentes fuera del horario de [place], no estamos disponibles las 24 horas del da, los 7 [address]  [address] de la [place].   Si tiene un problema urgente y no puede comunicarse con nosotros, puede optar por buscar atencin mdica  en el consultorio de su doctor(a), en una clnica privada, en un centro de atencin urgente o en una sala de emergencias.  Si tiene Engineer, drilling, por favor llame inmediatamente al 911 o vaya a la sala de emergencias.  Nmeros de bper  - Dr. Bary Likes: [phone]  - Dra. Annette Barters: [phone]  - Dr. Felipe Horton: [phone]   En caso de inclemencias del tiempo, por favor llame a Lajuan Pila principal al [phone] para una actualizacin sobre el [place] de cualquier retraso o cierre.  Consejos para la medicacin en dermatologa: Por favor, guarde las cajas en las que vienen los medicamentos de uso tpico para ayudarle a seguir las instrucciones sobre dnde y cmo usarlos. Las farmacias generalmente imprimen las instrucciones del medicamento slo en las cajas y no directamente en los tubos del [place].   Si su medicamento es muy caro, por favor, pngase en contacto con Bettyjane Brunet llamando  al [phone] y presione la opcin 4 o envenos un mensaje a travs de Clinical cytogeneticist.   No podemos decirle cul ser su copago por los medicamentos por adelantado ya que esto es diferente dependiendo de la cobertura de su seguro. Sin embargo, es posible que podamos encontrar un medicamento sustituto a Audiological scientist un formulario para que el seguro cubra el medicamento que se considera necesario.   Si se requiere una autorizacin previa para que su compaa de seguros [place] su medicamento, por favor permtanos de 1 a 2 das hbiles para completar este proceso.  Los precios de los medicamentos varan con frecuencia dependiendo del Environmental consultant de dnde se surte la receta y alguna farmacias pueden ofrecer precios ms baratos.  El sitio web www.goodrx.com tiene cupones para medicamentos de Health and safety inspector. Los precios aqu no tienen en cuenta lo que podra costar con la ayuda del seguro (puede ser ms barato con su seguro), pero el sitio web puede darle el precio si no utiliz Tourist information centre manager.  -  Puede imprimir el cupn correspondiente y llevarlo con su receta a la farmacia.  - Tambin puede pasar por nuestra oficina durante el horario de atencin regular y Education officer, museum una tarjeta de cupones de GoodRx.  - Si necesita que su receta se enve electrnicamente a una farmacia diferente, informe a nuestra oficina a travs de MyChart de [place] o por telfono llamando al [phone] y presione la opcin 4.

## 2023-08-30 IMAGING — MR MR FOOT*R* WO/W CM
9 series · 40 of 40 positions shown · IV contrast (10ml Gadavist)
Comparison: Radiographs, same date.

CLINICAL DATA: Diabetic foot ulcer.

EXAM:
MRI OF THE RIGHT FOREFOOT WITHOUT AND WITH CONTRAST
TECHNIQUE: Multiplanar, multisequence MR imaging of the right foot was
performed before and after the administration of intravenous
contrast.
CONTRAST:  10mL GADAVIST GADOBUTROL 1 MMOL/ML IV SOLN

[Series 5: T1 · coronal · right · 3.0mm · 0.38mm/px · 6 of 49 slices shown (1 of 2)]
[im 1/49]
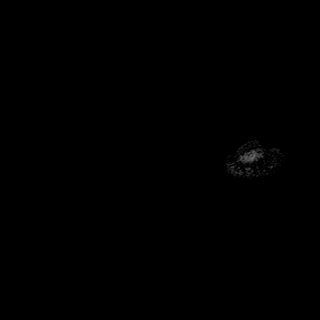
[im 10/49]
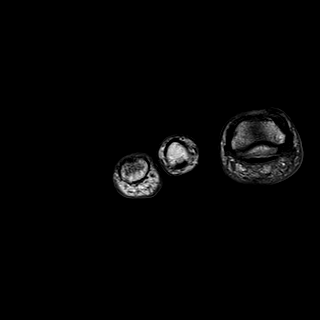
[im 20/49]
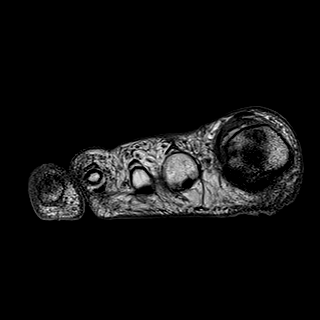
[im 29/49]
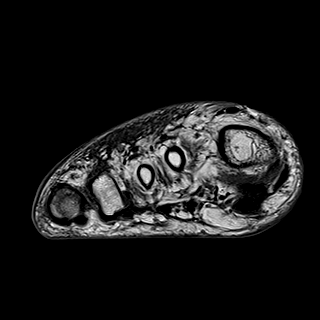
[im 39/49]
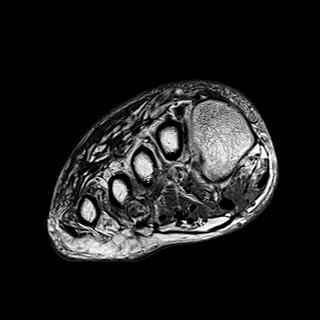
[im 49/49]
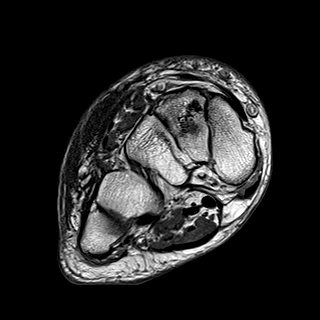

[Series 7: T2 · coronal · right · 3.0mm · 0.50mm/px · 6 of 49 slices shown (1 of 2)]
[im 1/49]
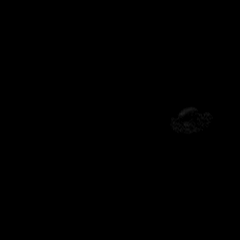
[im 10/49]
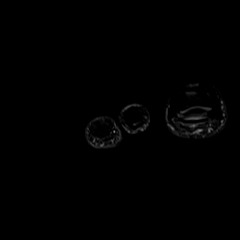
[im 20/49]
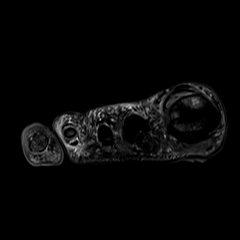
[im 29/49]
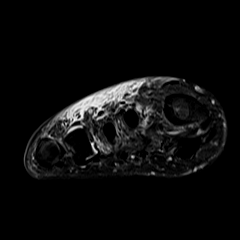
[im 39/49]
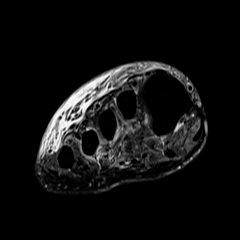
[im 49/49]
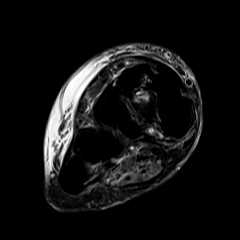

[Series 9: T1 · axial · right · 3.0mm · 0.70mm/px · z∈[-113,-40]mm · 3 of 20 slices shown (2 of 2)]
[im 1/20]
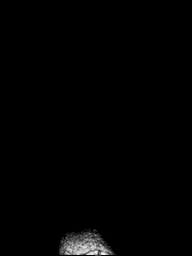
[im 10/20]
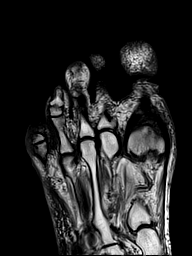
[im 20/20]
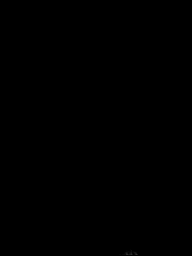

[Series 11: T2 · axial · right · 3.0mm · 0.70mm/px · z∈[-113,-40]mm · 3 of 20 slices shown (2 of 2)]
[im 1/20]
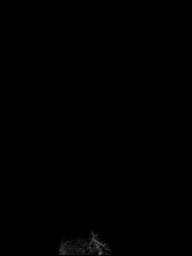
[im 10/20]
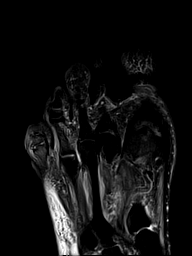
[im 20/20]
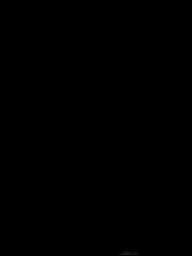

[Series 12: STIR · sagittal · right · 3.0mm · 0.62mm/px · 4 of 33 slices shown]
[im 1/33]
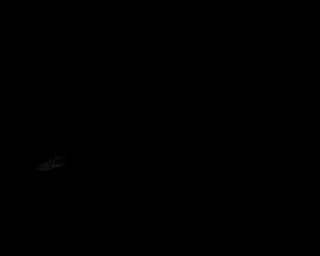
[im 11/33]
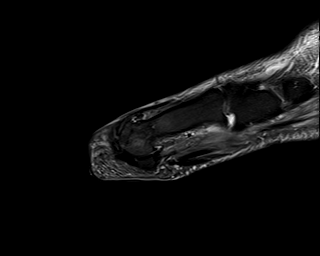
[im 22/33]
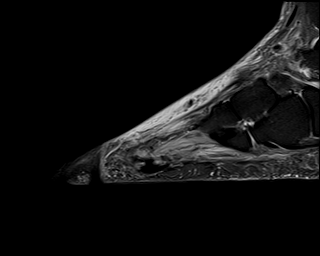
[im 33/33]
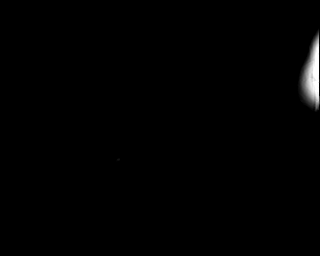

[Series 13: T1 fat-sat · coronal · non-contrast · right · 3.0mm · 0.47mm/px · 6 of 49 slices shown (1 of 3)]
[im 1/49]
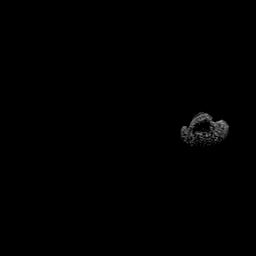
[im 10/49]
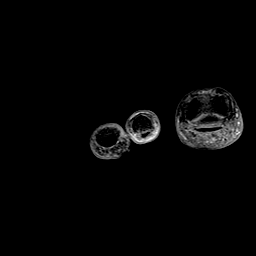
[im 20/49]
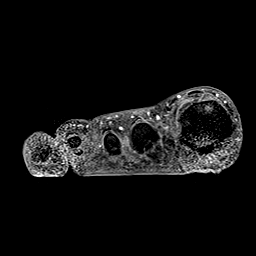
[im 29/49]
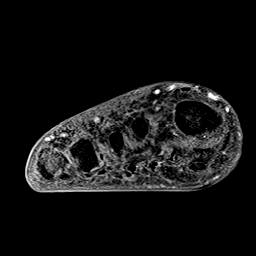
[im 39/49]
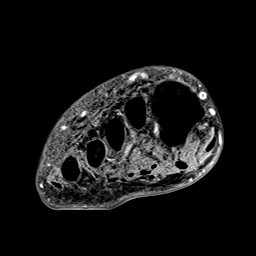
[im 49/49]
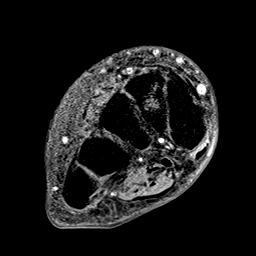

[Series 14: T1 fat-sat post-contrast · coronal · right · 3.0mm · 0.47mm/px · 6 of 49 slices shown]
[im 1/49]
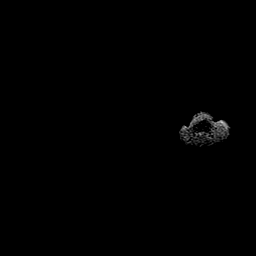
[im 10/49]
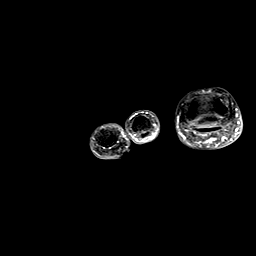
[im 20/49]
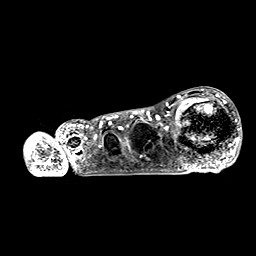
[im 29/49]
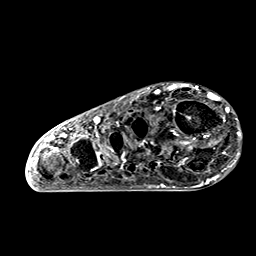
[im 39/49]
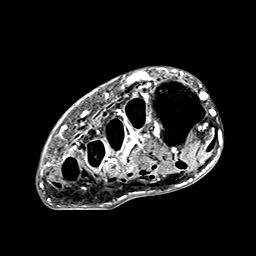
[im 49/49]
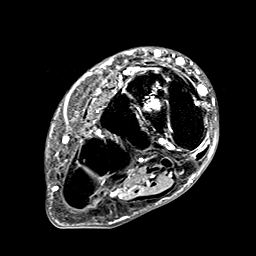

[Series 15: T1 fat-sat · sagittal · right · 3.0mm · 0.62mm/px · 3 of 27 slices shown (2 of 3)]
[im 1/27]
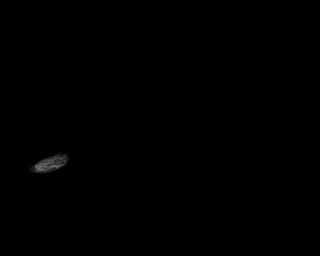
[im 14/27]
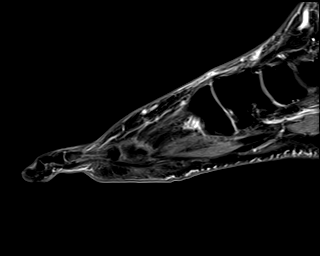
[im 27/27]
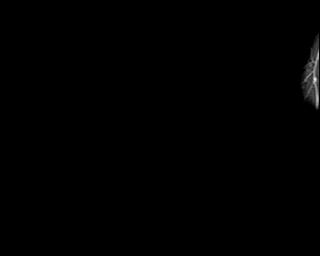

[Series 16: T1 fat-sat · axial · right · 3.0mm · 0.56mm/px · z∈[-113,-40]mm · 3 of 20 slices shown (3 of 3)]
[im 1/20]
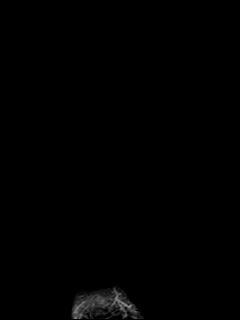
[im 10/20]
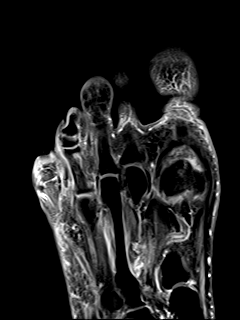
[im 20/20]
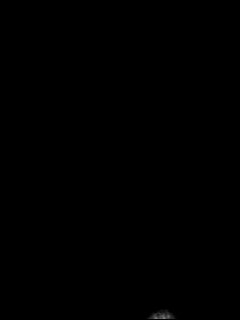

[40 of 40 positions shown; findings below may reference images not displayed]

FINDINGS: Diffuse subcutaneous soft tissue swelling/edema/fluid mainly along
the dorsum of the foot consistent with cellulitis. No discrete fluid
collection to suggest a drainable abscess. There is also moderate
diffuse myofasciitis without findings suspicious for pyomyositis.

Small open wound is noted along the lateral aspect of the fifth PIP
joint. There is associated abnormal marrow signal in the proximal
and middle phalanges of the fifth digit and subsequent contrast
enhancement consistent with osteomyelitis. There is likely septic
arthritis at the PIP joint.

The other bony structures are grossly intact. There are moderate
degenerative changes at the first MTP joint with joint space
narrowing, osteophytic spurring and subchondral cystic change.
IMPRESSION: 1. Small open wound along the lateral aspect of the fifth PIP joint
with underlying osteomyelitis involving the proximal and middle
phalanges of the fifth digit. Probable septic arthritis at the PIP
joint.
2. Cellulitis and myofasciitis without findings for drainable
abscess.
3. Moderate degenerative changes at the first MTP joint.

## 2023-11-29 ENCOUNTER — Other Ambulatory Visit: Payer: Self-pay | Admitting: Acute Care

## 2023-11-29 DIAGNOSIS — Z122 Encounter for screening for malignant neoplasm of respiratory organs: Secondary | ICD-10-CM

## 2023-11-29 DIAGNOSIS — Z87891 Personal history of nicotine dependence: Secondary | ICD-10-CM

## 2023-11-29 DIAGNOSIS — F17210 Nicotine dependence, cigarettes, uncomplicated: Secondary | ICD-10-CM

## 2023-12-07 ENCOUNTER — Ambulatory Visit
Admission: RE | Admit: 2023-12-07 | Discharge: 2023-12-07 | Disposition: A | Discharge status: Home / Self Care | Source: Ambulatory Visit | Attending: Internal Medicine | Admitting: Internal Medicine

## 2023-12-07 DIAGNOSIS — Z122 Encounter for screening for malignant neoplasm of respiratory organs: Secondary | ICD-10-CM | POA: Insufficient documentation

## 2023-12-07 DIAGNOSIS — Z87891 Personal history of nicotine dependence: Secondary | ICD-10-CM | POA: Diagnosis present

## 2023-12-07 DIAGNOSIS — F17210 Nicotine dependence, cigarettes, uncomplicated: Secondary | ICD-10-CM | POA: Diagnosis present

## 2023-12-13 ENCOUNTER — Other Ambulatory Visit: Payer: Self-pay

## 2023-12-13 DIAGNOSIS — Z87891 Personal history of nicotine dependence: Secondary | ICD-10-CM

## 2023-12-13 DIAGNOSIS — F17210 Nicotine dependence, cigarettes, uncomplicated: Secondary | ICD-10-CM

## 2023-12-13 DIAGNOSIS — Z122 Encounter for screening for malignant neoplasm of respiratory organs: Secondary | ICD-10-CM

## 2024-01-01 ENCOUNTER — Ambulatory Visit: Admitting: Dermatology

## 2024-01-01 DIAGNOSIS — L814 Other melanin hyperpigmentation: Secondary | ICD-10-CM | POA: Diagnosis not present

## 2024-01-01 DIAGNOSIS — Z86006 Personal history of melanoma in-situ: Secondary | ICD-10-CM

## 2024-01-01 DIAGNOSIS — W908XXA Exposure to other nonionizing radiation, initial encounter: Secondary | ICD-10-CM | POA: Diagnosis not present

## 2024-01-01 DIAGNOSIS — L578 Other skin changes due to chronic exposure to nonionizing radiation: Secondary | ICD-10-CM | POA: Diagnosis not present

## 2024-01-01 DIAGNOSIS — D1801 Hemangioma of skin and subcutaneous tissue: Secondary | ICD-10-CM

## 2024-01-01 DIAGNOSIS — Z1283 Encounter for screening for malignant neoplasm of skin: Secondary | ICD-10-CM | POA: Diagnosis not present

## 2024-01-01 DIAGNOSIS — Z86018 Personal history of other benign neoplasm: Secondary | ICD-10-CM

## 2024-01-01 DIAGNOSIS — L821 Other seborrheic keratosis: Secondary | ICD-10-CM

## 2024-01-01 DIAGNOSIS — D229 Melanocytic nevi, unspecified: Secondary | ICD-10-CM

## 2024-01-01 DIAGNOSIS — L639 Alopecia areata, unspecified: Secondary | ICD-10-CM

## 2024-01-01 MED ORDER — TRIAMCINOLONE ACETONIDE 10 MG/ML IJ SUSP
10.0000 mg | Freq: Once | INTRAMUSCULAR | Status: AC
  Administered 2024-01-01: 10 mg

## 2024-01-01 NOTE — Patient Instructions (Addendum)

## 2024-01-01 NOTE — Progress Notes (Signed)
 Follow-Up Visit   Subjective  [name] is a 58 y.o. male who presents for the following: Skin Cancer Screening and Upper Body Skin Exam. 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 History of DN.  The patient presents for Upper Body Skin Exam (UBSE) for skin cancer screening and mole check. The patient has spots, moles and lesions to be evaluated, some may be new or changing. Patient with alopecia areata, unsure if improving with ILK injections.    The following portions of the chart were reviewed this encounter and updated as appropriate: medications, allergies, medical history  Review of Systems:  No other skin or systemic complaints except as noted in HPI or Assessment and Plan.  Objective  Well appearing patient in no apparent distress; mood and affect are within normal limits.  All skin waist up examined. Relevant physical exam findings are noted in the Assessment and Plan.   MMIS excision site R mid back 6 cm lat to spine   Posterior crown   L inferior vertex   Assessment & Plan  Skin cancer screening performed today.  Actinic Damage - Chronic condition, secondary to cumulative UV/sun exposure - diffuse scaly erythematous macules with underlying dyspigmentation - Recommend daily broad spectrum sunscreen SPF 30+ to sun-exposed areas, reapply every 2 hours as needed.  - Staying in the shade or wearing long sleeves, sun glasses (UVA+UVB protection) and wide brim hats (4-inch brim around the entire circumference of the hat) are also recommended for sun protection.  - Call for new or changing lesions.  Lentigines, Seborrheic Keratoses, Hemangiomas - Benign normal skin lesions - Benign-appearing - Call for any changes  Melanocytic Nevi Photos compared from 05/31/2022, stable  - Tan-brown and/or pink-flesh-colored symmetric macules and papules - Benign appearing on exam today - Observation - Call clinic for new or changing moles - Recommend  daily use of broad spectrum spf 30+ sunscreen to sun-exposed areas.   HISTORY OF MELANOMA IN SITU 5/23 - No evidence of recurrence today, scar clear with overlying brown macules (lentigines).  - R mid back 6.0cm lat to spine excised 08/11/21. - Recommend regular full body skin exams - Recommend daily broad spectrum sunscreen SPF 30+ to sun-exposed areas, reapply every 2 hours as needed.  - Call if any new or changing lesions are noted between office visits    HISTORY OF DYSPLASTIC NEVUS No evidence of recurrence today- L epigastric, R infra pectoral/R upper abdomen Recommend regular full body skin exams Recommend daily broad spectrum sunscreen SPF 30+ to sun-exposed areas, reapply every 2 hours as needed.  Call if any new or changing lesions are noted between office visits   ALOPECIA AREATA   Related Medications triamcinolone  acetonide (KENALOG ) 10 MG/ML injection 10 mg  ALOPECIA AREATA Exam: scalp with round patchy areas of hair loss - Post crown 2.5 cm patch; L inf vertex 2.5 cm patch. New photos taken today.   Chronic and persistent condition with duration or expected duration over one year. Condition is symptomatic/ bothersome to patient. Not currently at goal, but improving compared to photos.   Alopecia areata is a chronic autoimmune condition localized to the skin which affects hair follicles and causes hair loss, most commonly in the scalp.  Cause is unknown.  Can be unpredictable, difficult to treat, and may recur.  Treatments may include topical and intralesional steroids to decrease inflammation to allow for hair regrowth.  Other treatments may include narrowband ultraviolet B light treatment; topical Squairic  acid immunotherapy application; topical or oral Minoxidil; antihistamines and oral Jak inhibitors.   Treatment Plan: Procedure Note Intralesional Injection  Location: Posterior crown, Left inferior vertex  Informed Consent: Discussed risks (infection, pain,  bleeding, bruising, thinning of the skin, loss of skin pigment, lack of resolution, and recurrence of lesion) and benefits of the procedure, as well as the alternatives. Informed consent was obtained. Preparation: The area was prepared a standard fashion.  Anesthesia:none  Procedure Details: An intralesional injection was performed with Kenalog  3.0 mg/cc. 2 cc in total were injected (1 cc to post crown, 1 cc to L inf vertex) NDC #: 9996-9505-79 Lot [id number] Exp: 11/2025  Total number of injections: >7  Plan: The patient was instructed on post-op care. Recommend OTC analgesia as needed for pain.   Return in about 6 months (around 07/01/2024) for UBSE, Hx melanoma in situ, Hx Dysplastic Nevus, Alopecia.  IAndrea Kerns, CMA, am acting as scribe for Rexene Rattler, MD .   Documentation: I have reviewed the above documentation for accuracy and completeness, and I agree with the above.  Rexene Rattler, MD

## 2024-03-25 ENCOUNTER — Ambulatory Visit: Admitting: Podiatry

## 2024-03-25 DIAGNOSIS — E119 Type 2 diabetes mellitus without complications: Secondary | ICD-10-CM | POA: Diagnosis not present

## 2024-03-25 DIAGNOSIS — M2032 Hallux varus (acquired), left foot: Secondary | ICD-10-CM

## 2024-03-25 DIAGNOSIS — L84 Corns and callosities: Secondary | ICD-10-CM | POA: Diagnosis not present

## 2024-03-25 DIAGNOSIS — Z0189 Encounter for other specified special examinations: Secondary | ICD-10-CM

## 2024-03-25 DIAGNOSIS — Z89421 Acquired absence of other right toe(s): Secondary | ICD-10-CM

## 2024-03-30 ENCOUNTER — Encounter: Payer: Self-pay | Admitting: Podiatry

## 2024-03-30 NOTE — Progress Notes (Signed)
 "  Subjective:  Patient ID: [name], male    DOB: 09-18-1965,  MRN: [id number]  [name] presents to clinic today for for annual diabetic foot examination, at risk foot care. 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 Painful toenails interfere with ambulation. Aggravating factors include wearing enclosed shoe gear. Pain is relieved with periodic professional debridement. Painful calluses are aggravated when weightbearing with and without shoegear. Pain is relieved with periodic professional debridement.  Chief Complaint  Patient presents with   Diabetes    He saw Dr. Epifanio in Nov. A1c is about 7 he reports   New problem(s): None.   PCP is Epifanio Alm SQUIBB, MD.  Allergies[1]  Review of Systems: Negative except as noted in the HPI.  Objective: No changes noted in today's physical examination. There were no vitals filed for this visit. [name] is a pleasant 59 y.o. male in NAD. AAO x 3.   Diabetic foot exam was performed with the following findings:   Vascular Examination: Capillary refill time immediate b/l. Vascular status intact b/l with palpable pedal pulses. Pedal hair diminished b/l. No pain with calf compression b/l. Skin temperature gradient WNL b/l. No cyanosis or clubbing b/l. No ischemia or gangrene noted b/l.   Neurological Examination: Sensation grossly intact b/l with 10 gram monofilament.   Dermatological Examination: Pedal skin with normal turgor, texture and tone b/l.  No open wounds. No interdigital macerations.   Toenails 1-4 b/l and left 5th digit thick, discolored, elongated with subungual debris and pain on dorsal palpation.   Hyperkeratotic lesion(s) both nail borders right great toe and plantar IPJ of left great toe.  No erythema, no edema, no drainage, no fluctuance.  Musculoskeletal Examination: Muscle  strength 5/5 to all lower extremity muscle groups bilaterally. Hallux malleus left great toe. Lower extremity amputation(s): digital amputation R 5th toe.  Radiographs: None     Assessment/Plan: 1. Callus   2. Hallux malleus of left foot   3. Status post amputation of lesser toe of right foot   4. Type 2 diabetes mellitus without complication, without long-term current use of insulin  (HCC)   5. Encounter for diabetic foot exam [facility])   -Patient was evaluated today. All questions/concerns addressed on today's visit. -Diabetic foot examination performed today. -Patient to continue soft, supportive shoe gear daily. -Callus(es) both borders of right hallux and plantar IPJ of left great toe pared utilizing sterile scalpel blade without complication or incident. Total number debrided =2. -As a courtesy, mycotic toenails 1-5 left foot and 1-4 right foot were debrided in length and girth with sterile nail nippers and dremel file without incident. -Patient/POA to call should there be question/concern in the interim.   Return in about 3 months (around 06/23/2024).  [name], DPM      [place] LOCATION: [address], [place] [address]                   Office [phone]   [facility] LOCATION: [address] [place], [place] [address] Office [phone]     [1] No  Known Allergies  "

## 2024-06-24 ENCOUNTER — Ambulatory Visit: Admitting: Podiatry

## 2024-07-01 ENCOUNTER — Ambulatory Visit: Admitting: Dermatology
